# Patient Record
Sex: Male | Born: 1940 | Race: White | Hispanic: No | Marital: Married | Smoking: Former smoker
Health system: Southern US, Community
[De-identification: ages and names within clinical notes are randomized; demographics above are authoritative.]

## PROBLEM LIST (undated history)

## (undated) DIAGNOSIS — K219 Gastro-esophageal reflux disease without esophagitis: Secondary | ICD-10-CM

## (undated) DIAGNOSIS — R0600 Dyspnea, unspecified: Secondary | ICD-10-CM

## (undated) DIAGNOSIS — J449 Chronic obstructive pulmonary disease, unspecified: Secondary | ICD-10-CM

## (undated) DIAGNOSIS — I2510 Atherosclerotic heart disease of native coronary artery without angina pectoris: Secondary | ICD-10-CM

## (undated) DIAGNOSIS — G4730 Sleep apnea, unspecified: Secondary | ICD-10-CM

## (undated) DIAGNOSIS — Z7902 Long term (current) use of antithrombotics/antiplatelets: Secondary | ICD-10-CM

## (undated) DIAGNOSIS — E785 Hyperlipidemia, unspecified: Secondary | ICD-10-CM

## (undated) DIAGNOSIS — I255 Ischemic cardiomyopathy: Secondary | ICD-10-CM

## (undated) DIAGNOSIS — M1990 Unspecified osteoarthritis, unspecified site: Secondary | ICD-10-CM

## (undated) DIAGNOSIS — I4510 Unspecified right bundle-branch block: Secondary | ICD-10-CM

## (undated) DIAGNOSIS — A419 Sepsis, unspecified organism: Secondary | ICD-10-CM

## (undated) DIAGNOSIS — I509 Heart failure, unspecified: Secondary | ICD-10-CM

## (undated) DIAGNOSIS — I10 Essential (primary) hypertension: Secondary | ICD-10-CM

## (undated) DIAGNOSIS — E119 Type 2 diabetes mellitus without complications: Secondary | ICD-10-CM

## (undated) DIAGNOSIS — D649 Anemia, unspecified: Secondary | ICD-10-CM

## (undated) HISTORY — PX: HERNIA REPAIR: SHX51

## (undated) HISTORY — PX: CARDIAC SURGERY: SHX584

---

## 2004-09-06 HISTORY — PX: CORONARY ARTERY BYPASS GRAFT: SHX141

## 2004-11-18 HISTORY — PX: CORONARY ARTERY BYPASS GRAFT: SHX141

## 2005-01-08 ENCOUNTER — Other Ambulatory Visit: Payer: Self-pay

## 2005-01-11 ENCOUNTER — Ambulatory Visit: Payer: Self-pay | Admitting: Surgery

## 2005-02-12 ENCOUNTER — Ambulatory Visit: Payer: Self-pay | Admitting: Family Medicine

## 2005-02-25 ENCOUNTER — Ambulatory Visit: Payer: Self-pay | Admitting: Family Medicine

## 2005-11-14 DIAGNOSIS — I214 Non-ST elevation (NSTEMI) myocardial infarction: Secondary | ICD-10-CM

## 2005-11-14 DIAGNOSIS — I2510 Atherosclerotic heart disease of native coronary artery without angina pectoris: Secondary | ICD-10-CM

## 2005-11-14 HISTORY — DX: Atherosclerotic heart disease of native coronary artery without angina pectoris: I25.10

## 2005-11-14 HISTORY — DX: Non-ST elevation (NSTEMI) myocardial infarction: I21.4

## 2005-11-15 HISTORY — PX: CARDIAC CATHETERIZATION: SHX172

## 2005-11-18 DIAGNOSIS — Z951 Presence of aortocoronary bypass graft: Secondary | ICD-10-CM

## 2005-11-18 HISTORY — DX: Presence of aortocoronary bypass graft: Z95.1

## 2005-11-19 DIAGNOSIS — Z8679 Personal history of other diseases of the circulatory system: Secondary | ICD-10-CM

## 2005-11-19 HISTORY — DX: Personal history of other diseases of the circulatory system: Z86.79

## 2007-09-07 DIAGNOSIS — Z955 Presence of coronary angioplasty implant and graft: Secondary | ICD-10-CM

## 2007-09-07 HISTORY — DX: Presence of coronary angioplasty implant and graft: Z95.5

## 2007-09-07 HISTORY — PX: CORONARY ANGIOPLASTY WITH STENT PLACEMENT: SHX49

## 2011-01-19 DIAGNOSIS — I2510 Atherosclerotic heart disease of native coronary artery without angina pectoris: Secondary | ICD-10-CM | POA: Insufficient documentation

## 2011-01-19 DIAGNOSIS — I5042 Chronic combined systolic (congestive) and diastolic (congestive) heart failure: Secondary | ICD-10-CM | POA: Insufficient documentation

## 2011-01-19 DIAGNOSIS — E785 Hyperlipidemia, unspecified: Secondary | ICD-10-CM | POA: Insufficient documentation

## 2011-07-13 DIAGNOSIS — M159 Polyosteoarthritis, unspecified: Secondary | ICD-10-CM | POA: Insufficient documentation

## 2012-12-13 DIAGNOSIS — N529 Male erectile dysfunction, unspecified: Secondary | ICD-10-CM | POA: Insufficient documentation

## 2013-11-05 DIAGNOSIS — E349 Endocrine disorder, unspecified: Secondary | ICD-10-CM | POA: Insufficient documentation

## 2013-11-30 HISTORY — PX: COLONOSCOPY: SHX174

## 2014-02-01 HISTORY — PX: CORONARY ANGIOPLASTY WITH STENT PLACEMENT: SHX49

## 2014-04-30 DIAGNOSIS — M79604 Pain in right leg: Secondary | ICD-10-CM | POA: Insufficient documentation

## 2014-04-30 DIAGNOSIS — M79605 Pain in left leg: Secondary | ICD-10-CM | POA: Insufficient documentation

## 2014-09-11 DIAGNOSIS — L409 Psoriasis, unspecified: Secondary | ICD-10-CM | POA: Insufficient documentation

## 2015-01-27 DIAGNOSIS — G8929 Other chronic pain: Secondary | ICD-10-CM | POA: Insufficient documentation

## 2015-09-08 DIAGNOSIS — M5442 Lumbago with sciatica, left side: Secondary | ICD-10-CM | POA: Diagnosis not present

## 2015-09-08 DIAGNOSIS — M25552 Pain in left hip: Secondary | ICD-10-CM | POA: Diagnosis not present

## 2015-09-08 DIAGNOSIS — M9903 Segmental and somatic dysfunction of lumbar region: Secondary | ICD-10-CM | POA: Diagnosis not present

## 2015-09-10 DIAGNOSIS — M9903 Segmental and somatic dysfunction of lumbar region: Secondary | ICD-10-CM | POA: Diagnosis not present

## 2015-09-10 DIAGNOSIS — M25552 Pain in left hip: Secondary | ICD-10-CM | POA: Diagnosis not present

## 2015-09-10 DIAGNOSIS — M5442 Lumbago with sciatica, left side: Secondary | ICD-10-CM | POA: Diagnosis not present

## 2015-09-17 DIAGNOSIS — M5442 Lumbago with sciatica, left side: Secondary | ICD-10-CM | POA: Diagnosis not present

## 2015-09-17 DIAGNOSIS — M25552 Pain in left hip: Secondary | ICD-10-CM | POA: Diagnosis not present

## 2015-09-17 DIAGNOSIS — M9903 Segmental and somatic dysfunction of lumbar region: Secondary | ICD-10-CM | POA: Diagnosis not present

## 2015-10-22 DIAGNOSIS — I5042 Chronic combined systolic (congestive) and diastolic (congestive) heart failure: Secondary | ICD-10-CM | POA: Diagnosis not present

## 2015-10-22 DIAGNOSIS — M545 Low back pain: Secondary | ICD-10-CM | POA: Diagnosis not present

## 2015-10-22 DIAGNOSIS — E119 Type 2 diabetes mellitus without complications: Secondary | ICD-10-CM | POA: Diagnosis not present

## 2015-10-22 DIAGNOSIS — G894 Chronic pain syndrome: Secondary | ICD-10-CM | POA: Diagnosis not present

## 2015-10-22 DIAGNOSIS — I2510 Atherosclerotic heart disease of native coronary artery without angina pectoris: Secondary | ICD-10-CM | POA: Diagnosis not present

## 2015-11-04 DIAGNOSIS — H9192 Unspecified hearing loss, left ear: Secondary | ICD-10-CM | POA: Diagnosis not present

## 2015-11-14 DIAGNOSIS — I2510 Atherosclerotic heart disease of native coronary artery without angina pectoris: Secondary | ICD-10-CM | POA: Diagnosis not present

## 2015-11-14 DIAGNOSIS — G4730 Sleep apnea, unspecified: Secondary | ICD-10-CM | POA: Diagnosis not present

## 2015-11-14 DIAGNOSIS — R0602 Shortness of breath: Secondary | ICD-10-CM | POA: Diagnosis not present

## 2015-11-26 DIAGNOSIS — R9431 Abnormal electrocardiogram [ECG] [EKG]: Secondary | ICD-10-CM | POA: Diagnosis not present

## 2015-11-26 DIAGNOSIS — R0602 Shortness of breath: Secondary | ICD-10-CM | POA: Diagnosis not present

## 2015-11-26 DIAGNOSIS — Z951 Presence of aortocoronary bypass graft: Secondary | ICD-10-CM | POA: Diagnosis not present

## 2015-11-26 DIAGNOSIS — K8020 Calculus of gallbladder without cholecystitis without obstruction: Secondary | ICD-10-CM | POA: Diagnosis not present

## 2015-11-26 DIAGNOSIS — I340 Nonrheumatic mitral (valve) insufficiency: Secondary | ICD-10-CM | POA: Diagnosis not present

## 2015-11-26 DIAGNOSIS — I517 Cardiomegaly: Secondary | ICD-10-CM | POA: Diagnosis not present

## 2015-11-26 DIAGNOSIS — I519 Heart disease, unspecified: Secondary | ICD-10-CM | POA: Diagnosis not present

## 2015-11-26 DIAGNOSIS — E785 Hyperlipidemia, unspecified: Secondary | ICD-10-CM | POA: Diagnosis not present

## 2015-11-26 DIAGNOSIS — R911 Solitary pulmonary nodule: Secondary | ICD-10-CM | POA: Diagnosis not present

## 2015-11-26 DIAGNOSIS — R0600 Dyspnea, unspecified: Secondary | ICD-10-CM | POA: Diagnosis not present

## 2015-11-26 DIAGNOSIS — E119 Type 2 diabetes mellitus without complications: Secondary | ICD-10-CM | POA: Diagnosis not present

## 2015-11-26 DIAGNOSIS — R9439 Abnormal result of other cardiovascular function study: Secondary | ICD-10-CM | POA: Diagnosis not present

## 2015-11-26 DIAGNOSIS — I2510 Atherosclerotic heart disease of native coronary artery without angina pectoris: Secondary | ICD-10-CM | POA: Diagnosis not present

## 2015-11-26 DIAGNOSIS — I119 Hypertensive heart disease without heart failure: Secondary | ICD-10-CM | POA: Diagnosis not present

## 2015-12-17 DIAGNOSIS — R0602 Shortness of breath: Secondary | ICD-10-CM | POA: Diagnosis not present

## 2015-12-17 DIAGNOSIS — E119 Type 2 diabetes mellitus without complications: Secondary | ICD-10-CM | POA: Diagnosis not present

## 2015-12-17 DIAGNOSIS — E7800 Pure hypercholesterolemia, unspecified: Secondary | ICD-10-CM | POA: Diagnosis not present

## 2015-12-17 DIAGNOSIS — I2510 Atherosclerotic heart disease of native coronary artery without angina pectoris: Secondary | ICD-10-CM | POA: Diagnosis not present

## 2015-12-18 DIAGNOSIS — I2510 Atherosclerotic heart disease of native coronary artery without angina pectoris: Secondary | ICD-10-CM | POA: Diagnosis not present

## 2015-12-18 DIAGNOSIS — E7800 Pure hypercholesterolemia, unspecified: Secondary | ICD-10-CM | POA: Diagnosis not present

## 2015-12-18 DIAGNOSIS — R0602 Shortness of breath: Secondary | ICD-10-CM | POA: Diagnosis not present

## 2015-12-18 DIAGNOSIS — E119 Type 2 diabetes mellitus without complications: Secondary | ICD-10-CM | POA: Diagnosis not present

## 2015-12-23 DIAGNOSIS — I255 Ischemic cardiomyopathy: Secondary | ICD-10-CM | POA: Diagnosis not present

## 2015-12-23 DIAGNOSIS — Z833 Family history of diabetes mellitus: Secondary | ICD-10-CM | POA: Diagnosis not present

## 2015-12-23 DIAGNOSIS — R0789 Other chest pain: Secondary | ICD-10-CM | POA: Diagnosis not present

## 2015-12-23 DIAGNOSIS — I10 Essential (primary) hypertension: Secondary | ICD-10-CM | POA: Diagnosis not present

## 2015-12-23 DIAGNOSIS — Z951 Presence of aortocoronary bypass graft: Secondary | ICD-10-CM | POA: Diagnosis not present

## 2015-12-23 DIAGNOSIS — I25719 Atherosclerosis of autologous vein coronary artery bypass graft(s) with unspecified angina pectoris: Secondary | ICD-10-CM | POA: Diagnosis not present

## 2015-12-23 DIAGNOSIS — R0609 Other forms of dyspnea: Secondary | ICD-10-CM | POA: Diagnosis not present

## 2015-12-23 DIAGNOSIS — E119 Type 2 diabetes mellitus without complications: Secondary | ICD-10-CM | POA: Diagnosis not present

## 2015-12-23 DIAGNOSIS — I252 Old myocardial infarction: Secondary | ICD-10-CM | POA: Diagnosis not present

## 2015-12-23 DIAGNOSIS — E7800 Pure hypercholesterolemia, unspecified: Secondary | ICD-10-CM | POA: Diagnosis not present

## 2015-12-23 DIAGNOSIS — Z87891 Personal history of nicotine dependence: Secondary | ICD-10-CM | POA: Diagnosis not present

## 2015-12-23 DIAGNOSIS — I2510 Atherosclerotic heart disease of native coronary artery without angina pectoris: Secondary | ICD-10-CM | POA: Diagnosis not present

## 2015-12-23 DIAGNOSIS — L409 Psoriasis, unspecified: Secondary | ICD-10-CM | POA: Diagnosis not present

## 2015-12-23 DIAGNOSIS — F329 Major depressive disorder, single episode, unspecified: Secondary | ICD-10-CM | POA: Diagnosis not present

## 2015-12-23 DIAGNOSIS — Z955 Presence of coronary angioplasty implant and graft: Secondary | ICD-10-CM | POA: Diagnosis not present

## 2015-12-23 DIAGNOSIS — I25119 Atherosclerotic heart disease of native coronary artery with unspecified angina pectoris: Secondary | ICD-10-CM | POA: Diagnosis not present

## 2015-12-23 DIAGNOSIS — Z8249 Family history of ischemic heart disease and other diseases of the circulatory system: Secondary | ICD-10-CM | POA: Diagnosis not present

## 2015-12-23 HISTORY — PX: LEFT HEART CATH AND CORS/GRAFTS ANGIOGRAPHY: CATH118250

## 2015-12-23 HISTORY — PX: CARDIAC CATHETERIZATION: SHX172

## 2015-12-31 DIAGNOSIS — R0602 Shortness of breath: Secondary | ICD-10-CM | POA: Diagnosis not present

## 2015-12-31 DIAGNOSIS — E785 Hyperlipidemia, unspecified: Secondary | ICD-10-CM | POA: Diagnosis not present

## 2015-12-31 DIAGNOSIS — Z955 Presence of coronary angioplasty implant and graft: Secondary | ICD-10-CM | POA: Diagnosis not present

## 2015-12-31 DIAGNOSIS — I25118 Atherosclerotic heart disease of native coronary artery with other forms of angina pectoris: Secondary | ICD-10-CM | POA: Diagnosis not present

## 2015-12-31 DIAGNOSIS — I200 Unstable angina: Secondary | ICD-10-CM | POA: Diagnosis not present

## 2015-12-31 DIAGNOSIS — I25119 Atherosclerotic heart disease of native coronary artery with unspecified angina pectoris: Secondary | ICD-10-CM | POA: Diagnosis not present

## 2015-12-31 DIAGNOSIS — I10 Essential (primary) hypertension: Secondary | ICD-10-CM | POA: Diagnosis not present

## 2015-12-31 DIAGNOSIS — E119 Type 2 diabetes mellitus without complications: Secondary | ICD-10-CM | POA: Diagnosis not present

## 2015-12-31 DIAGNOSIS — I255 Ischemic cardiomyopathy: Secondary | ICD-10-CM | POA: Diagnosis not present

## 2015-12-31 DIAGNOSIS — I2510 Atherosclerotic heart disease of native coronary artery without angina pectoris: Secondary | ICD-10-CM | POA: Diagnosis not present

## 2015-12-31 DIAGNOSIS — Z951 Presence of aortocoronary bypass graft: Secondary | ICD-10-CM | POA: Diagnosis not present

## 2015-12-31 DIAGNOSIS — Z79899 Other long term (current) drug therapy: Secondary | ICD-10-CM | POA: Diagnosis not present

## 2015-12-31 DIAGNOSIS — R9439 Abnormal result of other cardiovascular function study: Secondary | ICD-10-CM | POA: Diagnosis not present

## 2015-12-31 HISTORY — PX: CORONARY ANGIOPLASTY WITH STENT PLACEMENT: SHX49

## 2016-01-01 DIAGNOSIS — I2510 Atherosclerotic heart disease of native coronary artery without angina pectoris: Secondary | ICD-10-CM | POA: Diagnosis not present

## 2016-01-01 DIAGNOSIS — R9431 Abnormal electrocardiogram [ECG] [EKG]: Secondary | ICD-10-CM | POA: Diagnosis not present

## 2016-01-01 DIAGNOSIS — I252 Old myocardial infarction: Secondary | ICD-10-CM | POA: Diagnosis not present

## 2016-01-20 DIAGNOSIS — E119 Type 2 diabetes mellitus without complications: Secondary | ICD-10-CM | POA: Diagnosis not present

## 2016-01-20 DIAGNOSIS — G894 Chronic pain syndrome: Secondary | ICD-10-CM | POA: Diagnosis not present

## 2016-01-20 DIAGNOSIS — H6122 Impacted cerumen, left ear: Secondary | ICD-10-CM | POA: Diagnosis not present

## 2016-01-20 DIAGNOSIS — I2510 Atherosclerotic heart disease of native coronary artery without angina pectoris: Secondary | ICD-10-CM | POA: Diagnosis not present

## 2016-03-02 DIAGNOSIS — G4733 Obstructive sleep apnea (adult) (pediatric): Secondary | ICD-10-CM | POA: Diagnosis not present

## 2016-03-02 DIAGNOSIS — G8929 Other chronic pain: Secondary | ICD-10-CM | POA: Diagnosis not present

## 2016-03-22 DIAGNOSIS — E119 Type 2 diabetes mellitus without complications: Secondary | ICD-10-CM | POA: Diagnosis not present

## 2016-03-22 DIAGNOSIS — M130 Polyarthritis, unspecified: Secondary | ICD-10-CM | POA: Diagnosis not present

## 2016-03-22 DIAGNOSIS — G894 Chronic pain syndrome: Secondary | ICD-10-CM | POA: Diagnosis not present

## 2016-04-26 DIAGNOSIS — M19019 Primary osteoarthritis, unspecified shoulder: Secondary | ICD-10-CM | POA: Diagnosis not present

## 2016-04-26 DIAGNOSIS — M791 Myalgia: Secondary | ICD-10-CM | POA: Diagnosis not present

## 2016-04-26 DIAGNOSIS — F1120 Opioid dependence, uncomplicated: Secondary | ICD-10-CM | POA: Diagnosis not present

## 2016-04-26 DIAGNOSIS — G894 Chronic pain syndrome: Secondary | ICD-10-CM | POA: Diagnosis not present

## 2016-04-26 DIAGNOSIS — M545 Low back pain: Secondary | ICD-10-CM | POA: Diagnosis not present

## 2016-04-26 DIAGNOSIS — F419 Anxiety disorder, unspecified: Secondary | ICD-10-CM | POA: Diagnosis not present

## 2016-04-26 DIAGNOSIS — G8929 Other chronic pain: Secondary | ICD-10-CM | POA: Diagnosis not present

## 2016-04-26 DIAGNOSIS — Z79899 Other long term (current) drug therapy: Secondary | ICD-10-CM | POA: Diagnosis not present

## 2016-04-26 DIAGNOSIS — M5440 Lumbago with sciatica, unspecified side: Secondary | ICD-10-CM | POA: Diagnosis not present

## 2016-05-31 DIAGNOSIS — M5440 Lumbago with sciatica, unspecified side: Secondary | ICD-10-CM | POA: Diagnosis not present

## 2016-05-31 DIAGNOSIS — M1990 Unspecified osteoarthritis, unspecified site: Secondary | ICD-10-CM | POA: Diagnosis not present

## 2016-05-31 DIAGNOSIS — F1120 Opioid dependence, uncomplicated: Secondary | ICD-10-CM | POA: Diagnosis not present

## 2016-05-31 DIAGNOSIS — G894 Chronic pain syndrome: Secondary | ICD-10-CM | POA: Diagnosis not present

## 2016-05-31 DIAGNOSIS — F419 Anxiety disorder, unspecified: Secondary | ICD-10-CM | POA: Diagnosis not present

## 2016-05-31 DIAGNOSIS — G8929 Other chronic pain: Secondary | ICD-10-CM | POA: Diagnosis not present

## 2016-05-31 DIAGNOSIS — M545 Low back pain: Secondary | ICD-10-CM | POA: Diagnosis not present

## 2016-05-31 DIAGNOSIS — M791 Myalgia: Secondary | ICD-10-CM | POA: Diagnosis not present

## 2016-05-31 DIAGNOSIS — Z79899 Other long term (current) drug therapy: Secondary | ICD-10-CM | POA: Diagnosis not present

## 2016-06-17 DIAGNOSIS — I5042 Chronic combined systolic (congestive) and diastolic (congestive) heart failure: Secondary | ICD-10-CM | POA: Diagnosis not present

## 2016-06-17 DIAGNOSIS — I2510 Atherosclerotic heart disease of native coronary artery without angina pectoris: Secondary | ICD-10-CM | POA: Diagnosis not present

## 2016-06-17 DIAGNOSIS — E119 Type 2 diabetes mellitus without complications: Secondary | ICD-10-CM | POA: Diagnosis not present

## 2016-06-28 DIAGNOSIS — M1990 Unspecified osteoarthritis, unspecified site: Secondary | ICD-10-CM | POA: Diagnosis not present

## 2016-06-28 DIAGNOSIS — M791 Myalgia: Secondary | ICD-10-CM | POA: Diagnosis not present

## 2016-06-28 DIAGNOSIS — M5440 Lumbago with sciatica, unspecified side: Secondary | ICD-10-CM | POA: Diagnosis not present

## 2016-06-28 DIAGNOSIS — G8929 Other chronic pain: Secondary | ICD-10-CM | POA: Diagnosis not present

## 2016-06-28 DIAGNOSIS — F419 Anxiety disorder, unspecified: Secondary | ICD-10-CM | POA: Diagnosis not present

## 2016-06-28 DIAGNOSIS — M545 Low back pain: Secondary | ICD-10-CM | POA: Diagnosis not present

## 2016-06-28 DIAGNOSIS — G894 Chronic pain syndrome: Secondary | ICD-10-CM | POA: Diagnosis not present

## 2016-06-28 DIAGNOSIS — F1120 Opioid dependence, uncomplicated: Secondary | ICD-10-CM | POA: Diagnosis not present

## 2016-07-14 DIAGNOSIS — I2510 Atherosclerotic heart disease of native coronary artery without angina pectoris: Secondary | ICD-10-CM | POA: Diagnosis not present

## 2016-07-26 DIAGNOSIS — F419 Anxiety disorder, unspecified: Secondary | ICD-10-CM | POA: Diagnosis not present

## 2016-07-26 DIAGNOSIS — M5440 Lumbago with sciatica, unspecified side: Secondary | ICD-10-CM | POA: Diagnosis not present

## 2016-07-26 DIAGNOSIS — M545 Low back pain: Secondary | ICD-10-CM | POA: Diagnosis not present

## 2016-07-26 DIAGNOSIS — F1120 Opioid dependence, uncomplicated: Secondary | ICD-10-CM | POA: Diagnosis not present

## 2016-07-26 DIAGNOSIS — M791 Myalgia: Secondary | ICD-10-CM | POA: Diagnosis not present

## 2016-07-26 DIAGNOSIS — M1990 Unspecified osteoarthritis, unspecified site: Secondary | ICD-10-CM | POA: Diagnosis not present

## 2016-07-26 DIAGNOSIS — G8929 Other chronic pain: Secondary | ICD-10-CM | POA: Diagnosis not present

## 2016-07-26 DIAGNOSIS — G894 Chronic pain syndrome: Secondary | ICD-10-CM | POA: Diagnosis not present

## 2016-08-23 DIAGNOSIS — M5440 Lumbago with sciatica, unspecified side: Secondary | ICD-10-CM | POA: Diagnosis not present

## 2016-08-23 DIAGNOSIS — G8929 Other chronic pain: Secondary | ICD-10-CM | POA: Diagnosis not present

## 2016-08-23 DIAGNOSIS — M1990 Unspecified osteoarthritis, unspecified site: Secondary | ICD-10-CM | POA: Diagnosis not present

## 2016-08-23 DIAGNOSIS — G894 Chronic pain syndrome: Secondary | ICD-10-CM | POA: Diagnosis not present

## 2016-08-23 DIAGNOSIS — F1120 Opioid dependence, uncomplicated: Secondary | ICD-10-CM | POA: Diagnosis not present

## 2016-08-23 DIAGNOSIS — F419 Anxiety disorder, unspecified: Secondary | ICD-10-CM | POA: Diagnosis not present

## 2016-08-23 DIAGNOSIS — M545 Low back pain: Secondary | ICD-10-CM | POA: Diagnosis not present

## 2016-08-23 DIAGNOSIS — M791 Myalgia: Secondary | ICD-10-CM | POA: Diagnosis not present

## 2016-09-14 DIAGNOSIS — E119 Type 2 diabetes mellitus without complications: Secondary | ICD-10-CM | POA: Diagnosis not present

## 2016-09-14 DIAGNOSIS — G8929 Other chronic pain: Secondary | ICD-10-CM | POA: Diagnosis not present

## 2016-09-14 DIAGNOSIS — M545 Low back pain: Secondary | ICD-10-CM | POA: Diagnosis not present

## 2016-09-14 DIAGNOSIS — I2510 Atherosclerotic heart disease of native coronary artery without angina pectoris: Secondary | ICD-10-CM | POA: Diagnosis not present

## 2016-09-14 DIAGNOSIS — M5440 Lumbago with sciatica, unspecified side: Secondary | ICD-10-CM | POA: Diagnosis not present

## 2016-09-14 DIAGNOSIS — E7800 Pure hypercholesterolemia, unspecified: Secondary | ICD-10-CM | POA: Diagnosis not present

## 2016-09-14 DIAGNOSIS — G894 Chronic pain syndrome: Secondary | ICD-10-CM | POA: Diagnosis not present

## 2016-09-16 DIAGNOSIS — H2513 Age-related nuclear cataract, bilateral: Secondary | ICD-10-CM | POA: Diagnosis not present

## 2016-09-16 DIAGNOSIS — E119 Type 2 diabetes mellitus without complications: Secondary | ICD-10-CM | POA: Diagnosis not present

## 2016-09-16 DIAGNOSIS — H43813 Vitreous degeneration, bilateral: Secondary | ICD-10-CM | POA: Diagnosis not present

## 2016-09-16 DIAGNOSIS — H35362 Drusen (degenerative) of macula, left eye: Secondary | ICD-10-CM | POA: Diagnosis not present

## 2016-09-17 DIAGNOSIS — Z0000 Encounter for general adult medical examination without abnormal findings: Secondary | ICD-10-CM | POA: Diagnosis not present

## 2016-10-06 DIAGNOSIS — Z7982 Long term (current) use of aspirin: Secondary | ICD-10-CM | POA: Diagnosis not present

## 2016-10-06 DIAGNOSIS — M25552 Pain in left hip: Secondary | ICD-10-CM | POA: Diagnosis not present

## 2016-10-06 DIAGNOSIS — Z79899 Other long term (current) drug therapy: Secondary | ICD-10-CM | POA: Diagnosis not present

## 2016-10-06 DIAGNOSIS — M5440 Lumbago with sciatica, unspecified side: Secondary | ICD-10-CM | POA: Diagnosis not present

## 2016-10-06 DIAGNOSIS — M25551 Pain in right hip: Secondary | ICD-10-CM | POA: Diagnosis not present

## 2016-10-06 DIAGNOSIS — G8929 Other chronic pain: Secondary | ICD-10-CM | POA: Diagnosis not present

## 2016-10-06 DIAGNOSIS — R451 Restlessness and agitation: Secondary | ICD-10-CM | POA: Diagnosis not present

## 2016-10-06 DIAGNOSIS — F419 Anxiety disorder, unspecified: Secondary | ICD-10-CM | POA: Diagnosis not present

## 2016-10-06 DIAGNOSIS — I5042 Chronic combined systolic (congestive) and diastolic (congestive) heart failure: Secondary | ICD-10-CM | POA: Diagnosis not present

## 2016-10-06 DIAGNOSIS — G479 Sleep disorder, unspecified: Secondary | ICD-10-CM | POA: Diagnosis not present

## 2016-10-06 DIAGNOSIS — E119 Type 2 diabetes mellitus without complications: Secondary | ICD-10-CM | POA: Diagnosis not present

## 2016-10-06 DIAGNOSIS — I110 Hypertensive heart disease with heart failure: Secondary | ICD-10-CM | POA: Diagnosis not present

## 2016-10-06 DIAGNOSIS — I2510 Atherosclerotic heart disease of native coronary artery without angina pectoris: Secondary | ICD-10-CM | POA: Diagnosis not present

## 2016-10-06 DIAGNOSIS — M62830 Muscle spasm of back: Secondary | ICD-10-CM | POA: Diagnosis not present

## 2016-10-06 DIAGNOSIS — L659 Nonscarring hair loss, unspecified: Secondary | ICD-10-CM | POA: Diagnosis not present

## 2016-10-06 DIAGNOSIS — R350 Frequency of micturition: Secondary | ICD-10-CM | POA: Diagnosis not present

## 2016-10-06 DIAGNOSIS — F329 Major depressive disorder, single episode, unspecified: Secondary | ICD-10-CM | POA: Diagnosis not present

## 2016-10-06 DIAGNOSIS — D649 Anemia, unspecified: Secondary | ICD-10-CM | POA: Diagnosis not present

## 2016-10-06 DIAGNOSIS — R5383 Other fatigue: Secondary | ICD-10-CM | POA: Diagnosis not present

## 2016-10-06 DIAGNOSIS — M25511 Pain in right shoulder: Secondary | ICD-10-CM | POA: Diagnosis not present

## 2016-10-06 DIAGNOSIS — Z791 Long term (current) use of non-steroidal anti-inflammatories (NSAID): Secondary | ICD-10-CM | POA: Diagnosis not present

## 2016-10-06 DIAGNOSIS — R0602 Shortness of breath: Secondary | ICD-10-CM | POA: Diagnosis not present

## 2016-10-06 DIAGNOSIS — E785 Hyperlipidemia, unspecified: Secondary | ICD-10-CM | POA: Diagnosis not present

## 2016-10-06 DIAGNOSIS — M25512 Pain in left shoulder: Secondary | ICD-10-CM | POA: Diagnosis not present

## 2016-10-07 ENCOUNTER — Other Ambulatory Visit: Payer: Self-pay | Admitting: Unknown Physician Specialty

## 2016-10-07 DIAGNOSIS — H908 Mixed conductive and sensorineural hearing loss, unspecified: Secondary | ICD-10-CM | POA: Diagnosis not present

## 2016-10-07 DIAGNOSIS — H6123 Impacted cerumen, bilateral: Secondary | ICD-10-CM | POA: Diagnosis not present

## 2016-10-07 DIAGNOSIS — H60339 Swimmer's ear, unspecified ear: Secondary | ICD-10-CM | POA: Diagnosis not present

## 2016-10-07 DIAGNOSIS — H9390 Unspecified disorder of ear, unspecified ear: Secondary | ICD-10-CM

## 2016-10-11 DIAGNOSIS — F419 Anxiety disorder, unspecified: Secondary | ICD-10-CM | POA: Diagnosis not present

## 2016-10-11 DIAGNOSIS — G894 Chronic pain syndrome: Secondary | ICD-10-CM | POA: Diagnosis not present

## 2016-10-11 DIAGNOSIS — F1120 Opioid dependence, uncomplicated: Secondary | ICD-10-CM | POA: Diagnosis not present

## 2016-10-11 DIAGNOSIS — M545 Low back pain: Secondary | ICD-10-CM | POA: Diagnosis not present

## 2016-10-11 DIAGNOSIS — M1990 Unspecified osteoarthritis, unspecified site: Secondary | ICD-10-CM | POA: Diagnosis not present

## 2016-10-11 DIAGNOSIS — M5440 Lumbago with sciatica, unspecified side: Secondary | ICD-10-CM | POA: Diagnosis not present

## 2016-10-11 DIAGNOSIS — G8929 Other chronic pain: Secondary | ICD-10-CM | POA: Diagnosis not present

## 2016-10-11 DIAGNOSIS — M791 Myalgia: Secondary | ICD-10-CM | POA: Diagnosis not present

## 2016-10-20 ENCOUNTER — Ambulatory Visit: Payer: PPO | Attending: Unknown Physician Specialty

## 2016-10-21 DIAGNOSIS — H9190 Unspecified hearing loss, unspecified ear: Secondary | ICD-10-CM | POA: Diagnosis not present

## 2016-10-21 DIAGNOSIS — I5042 Chronic combined systolic (congestive) and diastolic (congestive) heart failure: Secondary | ICD-10-CM | POA: Diagnosis not present

## 2016-10-21 DIAGNOSIS — I2510 Atherosclerotic heart disease of native coronary artery without angina pectoris: Secondary | ICD-10-CM | POA: Diagnosis not present

## 2016-10-21 DIAGNOSIS — E119 Type 2 diabetes mellitus without complications: Secondary | ICD-10-CM | POA: Diagnosis not present

## 2016-10-21 DIAGNOSIS — I110 Hypertensive heart disease with heart failure: Secondary | ICD-10-CM | POA: Diagnosis not present

## 2016-10-21 DIAGNOSIS — H9122 Sudden idiopathic hearing loss, left ear: Secondary | ICD-10-CM | POA: Diagnosis not present

## 2016-10-21 DIAGNOSIS — Z7982 Long term (current) use of aspirin: Secondary | ICD-10-CM | POA: Diagnosis not present

## 2016-10-21 DIAGNOSIS — M799 Soft tissue disorder, unspecified: Secondary | ICD-10-CM | POA: Diagnosis not present

## 2016-10-21 DIAGNOSIS — E785 Hyperlipidemia, unspecified: Secondary | ICD-10-CM | POA: Diagnosis not present

## 2016-10-21 DIAGNOSIS — H90A32 Mixed conductive and sensorineural hearing loss, unilateral, left ear with restricted hearing on the contralateral side: Secondary | ICD-10-CM | POA: Diagnosis not present

## 2016-10-22 DIAGNOSIS — I10 Essential (primary) hypertension: Secondary | ICD-10-CM | POA: Diagnosis not present

## 2016-10-22 DIAGNOSIS — Z951 Presence of aortocoronary bypass graft: Secondary | ICD-10-CM | POA: Diagnosis not present

## 2016-10-22 DIAGNOSIS — E7800 Pure hypercholesterolemia, unspecified: Secondary | ICD-10-CM | POA: Diagnosis not present

## 2016-10-22 DIAGNOSIS — Z7982 Long term (current) use of aspirin: Secondary | ICD-10-CM | POA: Diagnosis not present

## 2016-10-22 DIAGNOSIS — I255 Ischemic cardiomyopathy: Secondary | ICD-10-CM | POA: Diagnosis not present

## 2016-10-22 DIAGNOSIS — E119 Type 2 diabetes mellitus without complications: Secondary | ICD-10-CM | POA: Diagnosis not present

## 2016-10-22 DIAGNOSIS — Z8249 Family history of ischemic heart disease and other diseases of the circulatory system: Secondary | ICD-10-CM | POA: Diagnosis not present

## 2016-10-22 DIAGNOSIS — Z955 Presence of coronary angioplasty implant and graft: Secondary | ICD-10-CM | POA: Diagnosis not present

## 2016-10-22 DIAGNOSIS — E785 Hyperlipidemia, unspecified: Secondary | ICD-10-CM | POA: Diagnosis not present

## 2016-10-22 DIAGNOSIS — I2582 Chronic total occlusion of coronary artery: Secondary | ICD-10-CM | POA: Diagnosis not present

## 2016-10-22 DIAGNOSIS — Z01818 Encounter for other preprocedural examination: Secondary | ICD-10-CM | POA: Diagnosis not present

## 2016-10-22 DIAGNOSIS — Z79899 Other long term (current) drug therapy: Secondary | ICD-10-CM | POA: Diagnosis not present

## 2016-10-22 DIAGNOSIS — I2510 Atherosclerotic heart disease of native coronary artery without angina pectoris: Secondary | ICD-10-CM | POA: Diagnosis not present

## 2016-10-25 DIAGNOSIS — H9122 Sudden idiopathic hearing loss, left ear: Secondary | ICD-10-CM | POA: Insufficient documentation

## 2016-10-26 DIAGNOSIS — I517 Cardiomegaly: Secondary | ICD-10-CM | POA: Diagnosis not present

## 2016-10-26 DIAGNOSIS — I5189 Other ill-defined heart diseases: Secondary | ICD-10-CM | POA: Diagnosis not present

## 2016-10-26 DIAGNOSIS — I348 Other nonrheumatic mitral valve disorders: Secondary | ICD-10-CM | POA: Diagnosis not present

## 2016-11-01 DIAGNOSIS — I10 Essential (primary) hypertension: Secondary | ICD-10-CM | POA: Diagnosis not present

## 2016-11-01 DIAGNOSIS — G3184 Mild cognitive impairment, so stated: Secondary | ICD-10-CM | POA: Diagnosis not present

## 2016-11-01 DIAGNOSIS — R918 Other nonspecific abnormal finding of lung field: Secondary | ICD-10-CM | POA: Diagnosis not present

## 2016-11-01 DIAGNOSIS — I5042 Chronic combined systolic (congestive) and diastolic (congestive) heart failure: Secondary | ICD-10-CM | POA: Diagnosis not present

## 2016-11-01 DIAGNOSIS — E1165 Type 2 diabetes mellitus with hyperglycemia: Secondary | ICD-10-CM | POA: Diagnosis not present

## 2016-11-01 DIAGNOSIS — E118 Type 2 diabetes mellitus with unspecified complications: Secondary | ICD-10-CM | POA: Diagnosis not present

## 2016-11-01 DIAGNOSIS — E785 Hyperlipidemia, unspecified: Secondary | ICD-10-CM | POA: Diagnosis not present

## 2016-11-01 DIAGNOSIS — Z01818 Encounter for other preprocedural examination: Secondary | ICD-10-CM | POA: Diagnosis not present

## 2016-11-01 DIAGNOSIS — R739 Hyperglycemia, unspecified: Secondary | ICD-10-CM | POA: Diagnosis not present

## 2016-11-01 DIAGNOSIS — Z600 Problems of adjustment to life-cycle transitions: Secondary | ICD-10-CM | POA: Diagnosis not present

## 2016-11-01 DIAGNOSIS — G8929 Other chronic pain: Secondary | ICD-10-CM | POA: Diagnosis not present

## 2016-11-01 DIAGNOSIS — I110 Hypertensive heart disease with heart failure: Secondary | ICD-10-CM | POA: Diagnosis not present

## 2016-11-01 DIAGNOSIS — I2510 Atherosclerotic heart disease of native coronary artery without angina pectoris: Secondary | ICD-10-CM | POA: Diagnosis not present

## 2016-11-02 DIAGNOSIS — E119 Type 2 diabetes mellitus without complications: Secondary | ICD-10-CM | POA: Diagnosis not present

## 2016-11-09 DIAGNOSIS — E119 Type 2 diabetes mellitus without complications: Secondary | ICD-10-CM | POA: Diagnosis not present

## 2016-11-15 DIAGNOSIS — M545 Low back pain: Secondary | ICD-10-CM | POA: Diagnosis not present

## 2016-11-15 DIAGNOSIS — Z79899 Other long term (current) drug therapy: Secondary | ICD-10-CM | POA: Diagnosis not present

## 2016-11-15 DIAGNOSIS — F1120 Opioid dependence, uncomplicated: Secondary | ICD-10-CM | POA: Diagnosis not present

## 2016-11-15 DIAGNOSIS — M1990 Unspecified osteoarthritis, unspecified site: Secondary | ICD-10-CM | POA: Diagnosis not present

## 2016-11-15 DIAGNOSIS — M5440 Lumbago with sciatica, unspecified side: Secondary | ICD-10-CM | POA: Diagnosis not present

## 2016-11-15 DIAGNOSIS — G8929 Other chronic pain: Secondary | ICD-10-CM | POA: Diagnosis not present

## 2016-11-15 DIAGNOSIS — F419 Anxiety disorder, unspecified: Secondary | ICD-10-CM | POA: Diagnosis not present

## 2016-11-15 DIAGNOSIS — M791 Myalgia: Secondary | ICD-10-CM | POA: Diagnosis not present

## 2016-11-15 DIAGNOSIS — G894 Chronic pain syndrome: Secondary | ICD-10-CM | POA: Diagnosis not present

## 2016-12-06 DIAGNOSIS — G894 Chronic pain syndrome: Secondary | ICD-10-CM | POA: Diagnosis not present

## 2016-12-06 DIAGNOSIS — F419 Anxiety disorder, unspecified: Secondary | ICD-10-CM | POA: Diagnosis not present

## 2016-12-06 DIAGNOSIS — G8929 Other chronic pain: Secondary | ICD-10-CM | POA: Diagnosis not present

## 2016-12-06 DIAGNOSIS — M5440 Lumbago with sciatica, unspecified side: Secondary | ICD-10-CM | POA: Diagnosis not present

## 2016-12-06 DIAGNOSIS — M545 Low back pain: Secondary | ICD-10-CM | POA: Diagnosis not present

## 2016-12-06 DIAGNOSIS — Z79899 Other long term (current) drug therapy: Secondary | ICD-10-CM | POA: Diagnosis not present

## 2016-12-06 DIAGNOSIS — M1990 Unspecified osteoarthritis, unspecified site: Secondary | ICD-10-CM | POA: Diagnosis not present

## 2016-12-06 DIAGNOSIS — F1120 Opioid dependence, uncomplicated: Secondary | ICD-10-CM | POA: Diagnosis not present

## 2016-12-06 DIAGNOSIS — M791 Myalgia: Secondary | ICD-10-CM | POA: Diagnosis not present

## 2016-12-15 DIAGNOSIS — E785 Hyperlipidemia, unspecified: Secondary | ICD-10-CM | POA: Diagnosis not present

## 2016-12-15 DIAGNOSIS — D2322 Other benign neoplasm of skin of left ear and external auricular canal: Secondary | ICD-10-CM | POA: Diagnosis not present

## 2016-12-15 DIAGNOSIS — H7192 Unspecified cholesteatoma, left ear: Secondary | ICD-10-CM | POA: Diagnosis not present

## 2016-12-15 DIAGNOSIS — D649 Anemia, unspecified: Secondary | ICD-10-CM | POA: Diagnosis not present

## 2016-12-15 DIAGNOSIS — I5042 Chronic combined systolic (congestive) and diastolic (congestive) heart failure: Secondary | ICD-10-CM | POA: Diagnosis not present

## 2016-12-15 DIAGNOSIS — H6042 Cholesteatoma of left external ear: Secondary | ICD-10-CM | POA: Diagnosis not present

## 2016-12-15 DIAGNOSIS — E119 Type 2 diabetes mellitus without complications: Secondary | ICD-10-CM | POA: Diagnosis not present

## 2016-12-15 DIAGNOSIS — I2510 Atherosclerotic heart disease of native coronary artery without angina pectoris: Secondary | ICD-10-CM | POA: Diagnosis not present

## 2016-12-15 DIAGNOSIS — M1990 Unspecified osteoarthritis, unspecified site: Secondary | ICD-10-CM | POA: Diagnosis not present

## 2016-12-15 DIAGNOSIS — Z7982 Long term (current) use of aspirin: Secondary | ICD-10-CM | POA: Diagnosis not present

## 2016-12-15 DIAGNOSIS — H938X2 Other specified disorders of left ear: Secondary | ICD-10-CM | POA: Diagnosis not present

## 2016-12-15 DIAGNOSIS — Z951 Presence of aortocoronary bypass graft: Secondary | ICD-10-CM | POA: Diagnosis not present

## 2016-12-15 DIAGNOSIS — H9122 Sudden idiopathic hearing loss, left ear: Secondary | ICD-10-CM | POA: Diagnosis not present

## 2016-12-15 DIAGNOSIS — I252 Old myocardial infarction: Secondary | ICD-10-CM | POA: Diagnosis not present

## 2016-12-15 DIAGNOSIS — I110 Hypertensive heart disease with heart failure: Secondary | ICD-10-CM | POA: Diagnosis not present

## 2017-01-03 DIAGNOSIS — M545 Low back pain: Secondary | ICD-10-CM | POA: Diagnosis not present

## 2017-01-03 DIAGNOSIS — M5440 Lumbago with sciatica, unspecified side: Secondary | ICD-10-CM | POA: Diagnosis not present

## 2017-01-03 DIAGNOSIS — G8929 Other chronic pain: Secondary | ICD-10-CM | POA: Diagnosis not present

## 2017-01-03 DIAGNOSIS — M791 Myalgia: Secondary | ICD-10-CM | POA: Diagnosis not present

## 2017-01-03 DIAGNOSIS — Z79899 Other long term (current) drug therapy: Secondary | ICD-10-CM | POA: Diagnosis not present

## 2017-01-03 DIAGNOSIS — F419 Anxiety disorder, unspecified: Secondary | ICD-10-CM | POA: Diagnosis not present

## 2017-01-03 DIAGNOSIS — F1120 Opioid dependence, uncomplicated: Secondary | ICD-10-CM | POA: Diagnosis not present

## 2017-01-03 DIAGNOSIS — G894 Chronic pain syndrome: Secondary | ICD-10-CM | POA: Diagnosis not present

## 2017-01-25 DIAGNOSIS — R600 Localized edema: Secondary | ICD-10-CM | POA: Diagnosis not present

## 2017-01-25 DIAGNOSIS — Z7902 Long term (current) use of antithrombotics/antiplatelets: Secondary | ICD-10-CM | POA: Diagnosis not present

## 2017-01-25 DIAGNOSIS — Z79899 Other long term (current) drug therapy: Secondary | ICD-10-CM | POA: Diagnosis not present

## 2017-01-25 DIAGNOSIS — Y9389 Activity, other specified: Secondary | ICD-10-CM | POA: Diagnosis not present

## 2017-01-25 DIAGNOSIS — W230XXA Caught, crushed, jammed, or pinched between moving objects, initial encounter: Secondary | ICD-10-CM | POA: Diagnosis not present

## 2017-01-25 DIAGNOSIS — Z87891 Personal history of nicotine dependence: Secondary | ICD-10-CM | POA: Diagnosis not present

## 2017-01-25 DIAGNOSIS — Y92810 Car as the place of occurrence of the external cause: Secondary | ICD-10-CM | POA: Diagnosis not present

## 2017-01-25 DIAGNOSIS — S59911A Unspecified injury of right forearm, initial encounter: Secondary | ICD-10-CM | POA: Diagnosis not present

## 2017-01-25 DIAGNOSIS — Z7984 Long term (current) use of oral hypoglycemic drugs: Secondary | ICD-10-CM | POA: Diagnosis not present

## 2017-01-25 DIAGNOSIS — I110 Hypertensive heart disease with heart failure: Secondary | ICD-10-CM | POA: Diagnosis not present

## 2017-01-25 DIAGNOSIS — M7989 Other specified soft tissue disorders: Secondary | ICD-10-CM | POA: Diagnosis not present

## 2017-01-25 DIAGNOSIS — Z8249 Family history of ischemic heart disease and other diseases of the circulatory system: Secondary | ICD-10-CM | POA: Diagnosis not present

## 2017-01-25 DIAGNOSIS — I2510 Atherosclerotic heart disease of native coronary artery without angina pectoris: Secondary | ICD-10-CM | POA: Diagnosis not present

## 2017-01-25 DIAGNOSIS — E785 Hyperlipidemia, unspecified: Secondary | ICD-10-CM | POA: Diagnosis not present

## 2017-01-25 DIAGNOSIS — E119 Type 2 diabetes mellitus without complications: Secondary | ICD-10-CM | POA: Diagnosis not present

## 2017-01-25 DIAGNOSIS — Z7982 Long term (current) use of aspirin: Secondary | ICD-10-CM | POA: Diagnosis not present

## 2017-01-25 DIAGNOSIS — S5011XA Contusion of right forearm, initial encounter: Secondary | ICD-10-CM | POA: Diagnosis not present

## 2017-01-25 DIAGNOSIS — I5042 Chronic combined systolic (congestive) and diastolic (congestive) heart failure: Secondary | ICD-10-CM | POA: Diagnosis not present

## 2017-02-01 DIAGNOSIS — R2231 Localized swelling, mass and lump, right upper limb: Secondary | ICD-10-CM | POA: Diagnosis not present

## 2017-02-07 DIAGNOSIS — M791 Myalgia: Secondary | ICD-10-CM | POA: Diagnosis not present

## 2017-02-07 DIAGNOSIS — F419 Anxiety disorder, unspecified: Secondary | ICD-10-CM | POA: Diagnosis not present

## 2017-02-07 DIAGNOSIS — M5440 Lumbago with sciatica, unspecified side: Secondary | ICD-10-CM | POA: Diagnosis not present

## 2017-02-07 DIAGNOSIS — M545 Low back pain: Secondary | ICD-10-CM | POA: Diagnosis not present

## 2017-02-07 DIAGNOSIS — G894 Chronic pain syndrome: Secondary | ICD-10-CM | POA: Diagnosis not present

## 2017-02-07 DIAGNOSIS — M1990 Unspecified osteoarthritis, unspecified site: Secondary | ICD-10-CM | POA: Diagnosis not present

## 2017-02-07 DIAGNOSIS — G8929 Other chronic pain: Secondary | ICD-10-CM | POA: Diagnosis not present

## 2017-02-07 DIAGNOSIS — F1120 Opioid dependence, uncomplicated: Secondary | ICD-10-CM | POA: Diagnosis not present

## 2017-02-08 DIAGNOSIS — E119 Type 2 diabetes mellitus without complications: Secondary | ICD-10-CM | POA: Diagnosis not present

## 2017-02-08 DIAGNOSIS — E782 Mixed hyperlipidemia: Secondary | ICD-10-CM | POA: Diagnosis not present

## 2017-02-08 DIAGNOSIS — I2510 Atherosclerotic heart disease of native coronary artery without angina pectoris: Secondary | ICD-10-CM | POA: Diagnosis not present

## 2017-02-08 DIAGNOSIS — D126 Benign neoplasm of colon, unspecified: Secondary | ICD-10-CM | POA: Diagnosis not present

## 2017-02-08 DIAGNOSIS — S5011XD Contusion of right forearm, subsequent encounter: Secondary | ICD-10-CM | POA: Diagnosis not present

## 2017-02-08 DIAGNOSIS — Z23 Encounter for immunization: Secondary | ICD-10-CM | POA: Diagnosis not present

## 2017-02-11 DIAGNOSIS — J929 Pleural plaque without asbestos: Secondary | ICD-10-CM | POA: Diagnosis not present

## 2017-02-11 DIAGNOSIS — J439 Emphysema, unspecified: Secondary | ICD-10-CM | POA: Diagnosis not present

## 2017-02-11 DIAGNOSIS — J9811 Atelectasis: Secondary | ICD-10-CM | POA: Diagnosis not present

## 2017-02-11 DIAGNOSIS — Z122 Encounter for screening for malignant neoplasm of respiratory organs: Secondary | ICD-10-CM | POA: Diagnosis not present

## 2017-02-11 DIAGNOSIS — Z87891 Personal history of nicotine dependence: Secondary | ICD-10-CM | POA: Diagnosis not present

## 2017-02-11 DIAGNOSIS — R918 Other nonspecific abnormal finding of lung field: Secondary | ICD-10-CM | POA: Diagnosis not present

## 2017-02-11 DIAGNOSIS — Z136 Encounter for screening for cardiovascular disorders: Secondary | ICD-10-CM | POA: Diagnosis not present

## 2017-03-14 DIAGNOSIS — M545 Low back pain: Secondary | ICD-10-CM | POA: Diagnosis not present

## 2017-03-14 DIAGNOSIS — G894 Chronic pain syndrome: Secondary | ICD-10-CM | POA: Diagnosis not present

## 2017-03-14 DIAGNOSIS — G8929 Other chronic pain: Secondary | ICD-10-CM | POA: Diagnosis not present

## 2017-03-14 DIAGNOSIS — M5440 Lumbago with sciatica, unspecified side: Secondary | ICD-10-CM | POA: Diagnosis not present

## 2017-03-14 DIAGNOSIS — M1990 Unspecified osteoarthritis, unspecified site: Secondary | ICD-10-CM | POA: Diagnosis not present

## 2017-03-14 DIAGNOSIS — F1120 Opioid dependence, uncomplicated: Secondary | ICD-10-CM | POA: Diagnosis not present

## 2017-03-14 DIAGNOSIS — Z79899 Other long term (current) drug therapy: Secondary | ICD-10-CM | POA: Diagnosis not present

## 2017-03-14 DIAGNOSIS — M791 Myalgia: Secondary | ICD-10-CM | POA: Diagnosis not present

## 2017-03-14 DIAGNOSIS — F419 Anxiety disorder, unspecified: Secondary | ICD-10-CM | POA: Diagnosis not present

## 2017-05-05 DIAGNOSIS — M549 Dorsalgia, unspecified: Secondary | ICD-10-CM | POA: Diagnosis not present

## 2017-05-05 DIAGNOSIS — M1990 Unspecified osteoarthritis, unspecified site: Secondary | ICD-10-CM | POA: Diagnosis not present

## 2017-05-05 DIAGNOSIS — I252 Old myocardial infarction: Secondary | ICD-10-CM | POA: Diagnosis not present

## 2017-05-05 DIAGNOSIS — I110 Hypertensive heart disease with heart failure: Secondary | ICD-10-CM | POA: Diagnosis not present

## 2017-05-05 DIAGNOSIS — E785 Hyperlipidemia, unspecified: Secondary | ICD-10-CM | POA: Diagnosis not present

## 2017-05-05 DIAGNOSIS — Z7984 Long term (current) use of oral hypoglycemic drugs: Secondary | ICD-10-CM | POA: Diagnosis not present

## 2017-05-05 DIAGNOSIS — Z1211 Encounter for screening for malignant neoplasm of colon: Secondary | ICD-10-CM | POA: Diagnosis not present

## 2017-05-05 DIAGNOSIS — Z955 Presence of coronary angioplasty implant and graft: Secondary | ICD-10-CM | POA: Diagnosis not present

## 2017-05-05 DIAGNOSIS — I25119 Atherosclerotic heart disease of native coronary artery with unspecified angina pectoris: Secondary | ICD-10-CM | POA: Diagnosis not present

## 2017-05-05 DIAGNOSIS — Z7902 Long term (current) use of antithrombotics/antiplatelets: Secondary | ICD-10-CM | POA: Diagnosis not present

## 2017-05-05 DIAGNOSIS — D125 Benign neoplasm of sigmoid colon: Secondary | ICD-10-CM | POA: Diagnosis not present

## 2017-05-05 DIAGNOSIS — Z87891 Personal history of nicotine dependence: Secondary | ICD-10-CM | POA: Diagnosis not present

## 2017-05-05 DIAGNOSIS — G8929 Other chronic pain: Secondary | ICD-10-CM | POA: Diagnosis not present

## 2017-05-05 DIAGNOSIS — Z86010 Personal history of colonic polyps: Secondary | ICD-10-CM | POA: Diagnosis not present

## 2017-05-05 DIAGNOSIS — I5042 Chronic combined systolic (congestive) and diastolic (congestive) heart failure: Secondary | ICD-10-CM | POA: Diagnosis not present

## 2017-05-05 DIAGNOSIS — Z79899 Other long term (current) drug therapy: Secondary | ICD-10-CM | POA: Diagnosis not present

## 2017-05-05 DIAGNOSIS — E119 Type 2 diabetes mellitus without complications: Secondary | ICD-10-CM | POA: Diagnosis not present

## 2017-05-05 DIAGNOSIS — Z888 Allergy status to other drugs, medicaments and biological substances status: Secondary | ICD-10-CM | POA: Diagnosis not present

## 2017-05-05 DIAGNOSIS — Z79891 Long term (current) use of opiate analgesic: Secondary | ICD-10-CM | POA: Diagnosis not present

## 2017-05-05 DIAGNOSIS — K635 Polyp of colon: Secondary | ICD-10-CM | POA: Diagnosis not present

## 2017-05-05 DIAGNOSIS — K529 Noninfective gastroenteritis and colitis, unspecified: Secondary | ICD-10-CM | POA: Diagnosis not present

## 2017-05-05 DIAGNOSIS — Z7982 Long term (current) use of aspirin: Secondary | ICD-10-CM | POA: Diagnosis not present

## 2017-05-05 DIAGNOSIS — Z951 Presence of aortocoronary bypass graft: Secondary | ICD-10-CM | POA: Diagnosis not present

## 2017-05-05 DIAGNOSIS — D175 Benign lipomatous neoplasm of intra-abdominal organs: Secondary | ICD-10-CM | POA: Diagnosis not present

## 2017-05-05 DIAGNOSIS — K6389 Other specified diseases of intestine: Secondary | ICD-10-CM | POA: Diagnosis not present

## 2017-05-05 HISTORY — PX: COLONOSCOPY: SHX174

## 2017-05-11 DIAGNOSIS — F419 Anxiety disorder, unspecified: Secondary | ICD-10-CM | POA: Diagnosis not present

## 2017-05-11 DIAGNOSIS — M545 Low back pain: Secondary | ICD-10-CM | POA: Diagnosis not present

## 2017-05-11 DIAGNOSIS — M1990 Unspecified osteoarthritis, unspecified site: Secondary | ICD-10-CM | POA: Diagnosis not present

## 2017-05-11 DIAGNOSIS — M5440 Lumbago with sciatica, unspecified side: Secondary | ICD-10-CM | POA: Diagnosis not present

## 2017-05-11 DIAGNOSIS — R5383 Other fatigue: Secondary | ICD-10-CM | POA: Diagnosis not present

## 2017-05-11 DIAGNOSIS — M791 Myalgia: Secondary | ICD-10-CM | POA: Diagnosis not present

## 2017-05-11 DIAGNOSIS — G894 Chronic pain syndrome: Secondary | ICD-10-CM | POA: Diagnosis not present

## 2017-05-11 DIAGNOSIS — G8929 Other chronic pain: Secondary | ICD-10-CM | POA: Diagnosis not present

## 2017-05-11 DIAGNOSIS — I2510 Atherosclerotic heart disease of native coronary artery without angina pectoris: Secondary | ICD-10-CM | POA: Diagnosis not present

## 2017-05-11 DIAGNOSIS — Z79899 Other long term (current) drug therapy: Secondary | ICD-10-CM | POA: Diagnosis not present

## 2017-05-11 DIAGNOSIS — I5042 Chronic combined systolic (congestive) and diastolic (congestive) heart failure: Secondary | ICD-10-CM | POA: Diagnosis not present

## 2017-05-11 DIAGNOSIS — E119 Type 2 diabetes mellitus without complications: Secondary | ICD-10-CM | POA: Diagnosis not present

## 2017-05-11 DIAGNOSIS — F1120 Opioid dependence, uncomplicated: Secondary | ICD-10-CM | POA: Diagnosis not present

## 2017-06-06 DIAGNOSIS — F419 Anxiety disorder, unspecified: Secondary | ICD-10-CM | POA: Diagnosis not present

## 2017-06-06 DIAGNOSIS — M1990 Unspecified osteoarthritis, unspecified site: Secondary | ICD-10-CM | POA: Diagnosis not present

## 2017-06-06 DIAGNOSIS — G8929 Other chronic pain: Secondary | ICD-10-CM | POA: Diagnosis not present

## 2017-06-06 DIAGNOSIS — G894 Chronic pain syndrome: Secondary | ICD-10-CM | POA: Diagnosis not present

## 2017-06-06 DIAGNOSIS — M5440 Lumbago with sciatica, unspecified side: Secondary | ICD-10-CM | POA: Diagnosis not present

## 2017-06-06 DIAGNOSIS — M791 Myalgia, unspecified site: Secondary | ICD-10-CM | POA: Diagnosis not present

## 2017-06-06 DIAGNOSIS — Z79899 Other long term (current) drug therapy: Secondary | ICD-10-CM | POA: Diagnosis not present

## 2017-06-06 DIAGNOSIS — M545 Low back pain: Secondary | ICD-10-CM | POA: Diagnosis not present

## 2017-06-06 DIAGNOSIS — F1120 Opioid dependence, uncomplicated: Secondary | ICD-10-CM | POA: Diagnosis not present

## 2017-08-08 DIAGNOSIS — G894 Chronic pain syndrome: Secondary | ICD-10-CM | POA: Diagnosis not present

## 2017-08-08 DIAGNOSIS — F419 Anxiety disorder, unspecified: Secondary | ICD-10-CM | POA: Diagnosis not present

## 2017-08-08 DIAGNOSIS — Z79899 Other long term (current) drug therapy: Secondary | ICD-10-CM | POA: Diagnosis not present

## 2017-08-08 DIAGNOSIS — F1120 Opioid dependence, uncomplicated: Secondary | ICD-10-CM | POA: Diagnosis not present

## 2017-08-08 DIAGNOSIS — G8929 Other chronic pain: Secondary | ICD-10-CM | POA: Diagnosis not present

## 2017-08-08 DIAGNOSIS — M545 Low back pain: Secondary | ICD-10-CM | POA: Diagnosis not present

## 2017-08-08 DIAGNOSIS — M791 Myalgia, unspecified site: Secondary | ICD-10-CM | POA: Diagnosis not present

## 2017-08-08 DIAGNOSIS — M1990 Unspecified osteoarthritis, unspecified site: Secondary | ICD-10-CM | POA: Diagnosis not present

## 2017-08-08 DIAGNOSIS — M5440 Lumbago with sciatica, unspecified side: Secondary | ICD-10-CM | POA: Diagnosis not present

## 2017-08-11 DIAGNOSIS — R5383 Other fatigue: Secondary | ICD-10-CM | POA: Diagnosis not present

## 2017-08-11 DIAGNOSIS — I2510 Atherosclerotic heart disease of native coronary artery without angina pectoris: Secondary | ICD-10-CM | POA: Diagnosis not present

## 2017-08-11 DIAGNOSIS — M545 Low back pain: Secondary | ICD-10-CM | POA: Diagnosis not present

## 2017-08-11 DIAGNOSIS — E782 Mixed hyperlipidemia: Secondary | ICD-10-CM | POA: Diagnosis not present

## 2017-08-11 DIAGNOSIS — E119 Type 2 diabetes mellitus without complications: Secondary | ICD-10-CM | POA: Diagnosis not present

## 2017-08-19 DIAGNOSIS — I2510 Atherosclerotic heart disease of native coronary artery without angina pectoris: Secondary | ICD-10-CM | POA: Diagnosis not present

## 2017-10-03 DIAGNOSIS — F419 Anxiety disorder, unspecified: Secondary | ICD-10-CM | POA: Diagnosis not present

## 2017-10-03 DIAGNOSIS — F1120 Opioid dependence, uncomplicated: Secondary | ICD-10-CM | POA: Diagnosis not present

## 2017-10-03 DIAGNOSIS — M1990 Unspecified osteoarthritis, unspecified site: Secondary | ICD-10-CM | POA: Diagnosis not present

## 2017-10-03 DIAGNOSIS — G894 Chronic pain syndrome: Secondary | ICD-10-CM | POA: Diagnosis not present

## 2017-10-03 DIAGNOSIS — M545 Low back pain: Secondary | ICD-10-CM | POA: Diagnosis not present

## 2017-10-03 DIAGNOSIS — M791 Myalgia, unspecified site: Secondary | ICD-10-CM | POA: Diagnosis not present

## 2017-10-03 DIAGNOSIS — Z79899 Other long term (current) drug therapy: Secondary | ICD-10-CM | POA: Diagnosis not present

## 2017-10-03 DIAGNOSIS — M5440 Lumbago with sciatica, unspecified side: Secondary | ICD-10-CM | POA: Diagnosis not present

## 2017-11-18 DIAGNOSIS — E785 Hyperlipidemia, unspecified: Secondary | ICD-10-CM | POA: Diagnosis not present

## 2017-11-18 DIAGNOSIS — Z7982 Long term (current) use of aspirin: Secondary | ICD-10-CM | POA: Diagnosis not present

## 2017-11-18 DIAGNOSIS — I255 Ischemic cardiomyopathy: Secondary | ICD-10-CM | POA: Diagnosis not present

## 2017-11-18 DIAGNOSIS — I10 Essential (primary) hypertension: Secondary | ICD-10-CM | POA: Diagnosis not present

## 2017-11-18 DIAGNOSIS — Z7902 Long term (current) use of antithrombotics/antiplatelets: Secondary | ICD-10-CM | POA: Diagnosis not present

## 2017-11-18 DIAGNOSIS — Z79899 Other long term (current) drug therapy: Secondary | ICD-10-CM | POA: Diagnosis not present

## 2017-11-18 DIAGNOSIS — Z955 Presence of coronary angioplasty implant and graft: Secondary | ICD-10-CM | POA: Diagnosis not present

## 2017-11-18 DIAGNOSIS — E119 Type 2 diabetes mellitus without complications: Secondary | ICD-10-CM | POA: Diagnosis not present

## 2017-11-18 DIAGNOSIS — I2510 Atherosclerotic heart disease of native coronary artery without angina pectoris: Secondary | ICD-10-CM | POA: Diagnosis not present

## 2017-11-18 DIAGNOSIS — Z951 Presence of aortocoronary bypass graft: Secondary | ICD-10-CM | POA: Diagnosis not present

## 2017-11-28 DIAGNOSIS — F1120 Opioid dependence, uncomplicated: Secondary | ICD-10-CM | POA: Diagnosis not present

## 2017-11-28 DIAGNOSIS — M1990 Unspecified osteoarthritis, unspecified site: Secondary | ICD-10-CM | POA: Diagnosis not present

## 2017-11-28 DIAGNOSIS — Z79899 Other long term (current) drug therapy: Secondary | ICD-10-CM | POA: Diagnosis not present

## 2017-11-28 DIAGNOSIS — M5440 Lumbago with sciatica, unspecified side: Secondary | ICD-10-CM | POA: Diagnosis not present

## 2017-11-28 DIAGNOSIS — M545 Low back pain: Secondary | ICD-10-CM | POA: Diagnosis not present

## 2017-11-28 DIAGNOSIS — M791 Myalgia, unspecified site: Secondary | ICD-10-CM | POA: Diagnosis not present

## 2017-11-28 DIAGNOSIS — F419 Anxiety disorder, unspecified: Secondary | ICD-10-CM | POA: Diagnosis not present

## 2017-11-28 DIAGNOSIS — G894 Chronic pain syndrome: Secondary | ICD-10-CM | POA: Diagnosis not present

## 2017-11-30 DIAGNOSIS — R079 Chest pain, unspecified: Secondary | ICD-10-CM | POA: Diagnosis not present

## 2017-11-30 DIAGNOSIS — I208 Other forms of angina pectoris: Secondary | ICD-10-CM | POA: Diagnosis not present

## 2017-11-30 DIAGNOSIS — R9439 Abnormal result of other cardiovascular function study: Secondary | ICD-10-CM | POA: Diagnosis not present

## 2018-01-23 DIAGNOSIS — M5440 Lumbago with sciatica, unspecified side: Secondary | ICD-10-CM | POA: Diagnosis not present

## 2018-01-23 DIAGNOSIS — G894 Chronic pain syndrome: Secondary | ICD-10-CM | POA: Diagnosis not present

## 2018-01-23 DIAGNOSIS — M1990 Unspecified osteoarthritis, unspecified site: Secondary | ICD-10-CM | POA: Diagnosis not present

## 2018-01-23 DIAGNOSIS — M791 Myalgia, unspecified site: Secondary | ICD-10-CM | POA: Diagnosis not present

## 2018-01-23 DIAGNOSIS — F419 Anxiety disorder, unspecified: Secondary | ICD-10-CM | POA: Diagnosis not present

## 2018-01-23 DIAGNOSIS — M545 Low back pain: Secondary | ICD-10-CM | POA: Diagnosis not present

## 2018-01-23 DIAGNOSIS — F1120 Opioid dependence, uncomplicated: Secondary | ICD-10-CM | POA: Diagnosis not present

## 2018-01-23 DIAGNOSIS — Z79899 Other long term (current) drug therapy: Secondary | ICD-10-CM | POA: Diagnosis not present

## 2018-03-20 DIAGNOSIS — M1990 Unspecified osteoarthritis, unspecified site: Secondary | ICD-10-CM | POA: Diagnosis not present

## 2018-03-20 DIAGNOSIS — M791 Myalgia, unspecified site: Secondary | ICD-10-CM | POA: Diagnosis not present

## 2018-03-20 DIAGNOSIS — Z79899 Other long term (current) drug therapy: Secondary | ICD-10-CM | POA: Diagnosis not present

## 2018-03-20 DIAGNOSIS — M5440 Lumbago with sciatica, unspecified side: Secondary | ICD-10-CM | POA: Diagnosis not present

## 2018-03-20 DIAGNOSIS — F419 Anxiety disorder, unspecified: Secondary | ICD-10-CM | POA: Diagnosis not present

## 2018-03-20 DIAGNOSIS — M545 Low back pain: Secondary | ICD-10-CM | POA: Diagnosis not present

## 2018-03-20 DIAGNOSIS — F1120 Opioid dependence, uncomplicated: Secondary | ICD-10-CM | POA: Diagnosis not present

## 2018-03-20 DIAGNOSIS — G894 Chronic pain syndrome: Secondary | ICD-10-CM | POA: Diagnosis not present

## 2018-03-31 DIAGNOSIS — E538 Deficiency of other specified B group vitamins: Secondary | ICD-10-CM | POA: Insufficient documentation

## 2018-03-31 DIAGNOSIS — L719 Rosacea, unspecified: Secondary | ICD-10-CM | POA: Insufficient documentation

## 2018-03-31 DIAGNOSIS — Z0000 Encounter for general adult medical examination without abnormal findings: Secondary | ICD-10-CM | POA: Diagnosis not present

## 2018-03-31 DIAGNOSIS — I2510 Atherosclerotic heart disease of native coronary artery without angina pectoris: Secondary | ICD-10-CM | POA: Diagnosis not present

## 2018-03-31 DIAGNOSIS — E119 Type 2 diabetes mellitus without complications: Secondary | ICD-10-CM | POA: Diagnosis not present

## 2018-03-31 DIAGNOSIS — R911 Solitary pulmonary nodule: Secondary | ICD-10-CM | POA: Diagnosis not present

## 2018-04-06 DIAGNOSIS — Z122 Encounter for screening for malignant neoplasm of respiratory organs: Secondary | ICD-10-CM | POA: Diagnosis not present

## 2018-04-06 DIAGNOSIS — Z87891 Personal history of nicotine dependence: Secondary | ICD-10-CM | POA: Diagnosis not present

## 2018-04-06 DIAGNOSIS — R918 Other nonspecific abnormal finding of lung field: Secondary | ICD-10-CM | POA: Diagnosis not present

## 2018-04-28 DIAGNOSIS — R5383 Other fatigue: Secondary | ICD-10-CM | POA: Diagnosis not present

## 2018-04-28 DIAGNOSIS — E538 Deficiency of other specified B group vitamins: Secondary | ICD-10-CM | POA: Diagnosis not present

## 2018-05-15 DIAGNOSIS — M791 Myalgia, unspecified site: Secondary | ICD-10-CM | POA: Diagnosis not present

## 2018-05-15 DIAGNOSIS — M1990 Unspecified osteoarthritis, unspecified site: Secondary | ICD-10-CM | POA: Diagnosis not present

## 2018-05-15 DIAGNOSIS — F419 Anxiety disorder, unspecified: Secondary | ICD-10-CM | POA: Diagnosis not present

## 2018-05-15 DIAGNOSIS — G894 Chronic pain syndrome: Secondary | ICD-10-CM | POA: Diagnosis not present

## 2018-05-15 DIAGNOSIS — F1120 Opioid dependence, uncomplicated: Secondary | ICD-10-CM | POA: Diagnosis not present

## 2018-05-15 DIAGNOSIS — Z79899 Other long term (current) drug therapy: Secondary | ICD-10-CM | POA: Diagnosis not present

## 2018-05-15 DIAGNOSIS — M5440 Lumbago with sciatica, unspecified side: Secondary | ICD-10-CM | POA: Diagnosis not present

## 2018-05-18 DIAGNOSIS — E119 Type 2 diabetes mellitus without complications: Secondary | ICD-10-CM | POA: Diagnosis not present

## 2018-05-18 DIAGNOSIS — H35362 Drusen (degenerative) of macula, left eye: Secondary | ICD-10-CM | POA: Diagnosis not present

## 2018-05-18 DIAGNOSIS — H43393 Other vitreous opacities, bilateral: Secondary | ICD-10-CM | POA: Diagnosis not present

## 2018-05-18 DIAGNOSIS — H04123 Dry eye syndrome of bilateral lacrimal glands: Secondary | ICD-10-CM | POA: Diagnosis not present

## 2018-05-18 DIAGNOSIS — H2513 Age-related nuclear cataract, bilateral: Secondary | ICD-10-CM | POA: Diagnosis not present

## 2018-06-10 DIAGNOSIS — E785 Hyperlipidemia, unspecified: Secondary | ICD-10-CM | POA: Diagnosis not present

## 2018-06-10 DIAGNOSIS — I252 Old myocardial infarction: Secondary | ICD-10-CM | POA: Diagnosis not present

## 2018-06-10 DIAGNOSIS — Z87891 Personal history of nicotine dependence: Secondary | ICD-10-CM | POA: Diagnosis not present

## 2018-06-10 DIAGNOSIS — R918 Other nonspecific abnormal finding of lung field: Secondary | ICD-10-CM | POA: Diagnosis not present

## 2018-06-10 DIAGNOSIS — Z951 Presence of aortocoronary bypass graft: Secondary | ICD-10-CM | POA: Diagnosis not present

## 2018-06-10 DIAGNOSIS — M549 Dorsalgia, unspecified: Secondary | ICD-10-CM | POA: Diagnosis not present

## 2018-06-10 DIAGNOSIS — Z809 Family history of malignant neoplasm, unspecified: Secondary | ICD-10-CM | POA: Diagnosis not present

## 2018-06-10 DIAGNOSIS — R9431 Abnormal electrocardiogram [ECG] [EKG]: Secondary | ICD-10-CM | POA: Diagnosis not present

## 2018-06-10 DIAGNOSIS — E119 Type 2 diabetes mellitus without complications: Secondary | ICD-10-CM | POA: Diagnosis not present

## 2018-06-10 DIAGNOSIS — R05 Cough: Secondary | ICD-10-CM | POA: Diagnosis not present

## 2018-06-10 DIAGNOSIS — J069 Acute upper respiratory infection, unspecified: Secondary | ICD-10-CM | POA: Diagnosis not present

## 2018-06-10 DIAGNOSIS — Z8249 Family history of ischemic heart disease and other diseases of the circulatory system: Secondary | ICD-10-CM | POA: Diagnosis not present

## 2018-06-10 DIAGNOSIS — I5042 Chronic combined systolic (congestive) and diastolic (congestive) heart failure: Secondary | ICD-10-CM | POA: Diagnosis not present

## 2018-06-10 DIAGNOSIS — I2510 Atherosclerotic heart disease of native coronary artery without angina pectoris: Secondary | ICD-10-CM | POA: Diagnosis not present

## 2018-06-10 DIAGNOSIS — I110 Hypertensive heart disease with heart failure: Secondary | ICD-10-CM | POA: Diagnosis not present

## 2018-06-10 DIAGNOSIS — J3489 Other specified disorders of nose and nasal sinuses: Secondary | ICD-10-CM | POA: Diagnosis not present

## 2018-06-10 DIAGNOSIS — Z833 Family history of diabetes mellitus: Secondary | ICD-10-CM | POA: Diagnosis not present

## 2018-06-10 DIAGNOSIS — R5383 Other fatigue: Secondary | ICD-10-CM | POA: Diagnosis not present

## 2018-08-07 DIAGNOSIS — M5440 Lumbago with sciatica, unspecified side: Secondary | ICD-10-CM | POA: Diagnosis not present

## 2018-08-07 DIAGNOSIS — F1120 Opioid dependence, uncomplicated: Secondary | ICD-10-CM | POA: Diagnosis not present

## 2018-08-07 DIAGNOSIS — G894 Chronic pain syndrome: Secondary | ICD-10-CM | POA: Diagnosis not present

## 2018-08-07 DIAGNOSIS — M791 Myalgia, unspecified site: Secondary | ICD-10-CM | POA: Diagnosis not present

## 2018-08-07 DIAGNOSIS — Z79899 Other long term (current) drug therapy: Secondary | ICD-10-CM | POA: Diagnosis not present

## 2018-08-07 DIAGNOSIS — F419 Anxiety disorder, unspecified: Secondary | ICD-10-CM | POA: Diagnosis not present

## 2018-08-07 DIAGNOSIS — M1990 Unspecified osteoarthritis, unspecified site: Secondary | ICD-10-CM | POA: Diagnosis not present

## 2018-08-25 DIAGNOSIS — I739 Peripheral vascular disease, unspecified: Secondary | ICD-10-CM | POA: Diagnosis not present

## 2018-08-31 DIAGNOSIS — Z955 Presence of coronary angioplasty implant and graft: Secondary | ICD-10-CM | POA: Diagnosis not present

## 2018-08-31 DIAGNOSIS — I25118 Atherosclerotic heart disease of native coronary artery with other forms of angina pectoris: Secondary | ICD-10-CM | POA: Diagnosis not present

## 2018-08-31 DIAGNOSIS — R600 Localized edema: Secondary | ICD-10-CM | POA: Diagnosis not present

## 2018-08-31 DIAGNOSIS — E1151 Type 2 diabetes mellitus with diabetic peripheral angiopathy without gangrene: Secondary | ICD-10-CM | POA: Diagnosis not present

## 2018-08-31 DIAGNOSIS — E7800 Pure hypercholesterolemia, unspecified: Secondary | ICD-10-CM | POA: Diagnosis not present

## 2018-08-31 DIAGNOSIS — I10 Essential (primary) hypertension: Secondary | ICD-10-CM | POA: Diagnosis not present

## 2018-08-31 DIAGNOSIS — G8929 Other chronic pain: Secondary | ICD-10-CM | POA: Diagnosis not present

## 2018-08-31 DIAGNOSIS — I25718 Atherosclerosis of autologous vein coronary artery bypass graft(s) with other forms of angina pectoris: Secondary | ICD-10-CM | POA: Diagnosis not present

## 2018-08-31 DIAGNOSIS — J984 Other disorders of lung: Secondary | ICD-10-CM | POA: Diagnosis not present

## 2018-08-31 DIAGNOSIS — I25110 Atherosclerotic heart disease of native coronary artery with unstable angina pectoris: Secondary | ICD-10-CM | POA: Diagnosis not present

## 2018-08-31 DIAGNOSIS — Z951 Presence of aortocoronary bypass graft: Secondary | ICD-10-CM | POA: Diagnosis not present

## 2018-08-31 DIAGNOSIS — R0989 Other specified symptoms and signs involving the circulatory and respiratory systems: Secondary | ICD-10-CM | POA: Diagnosis not present

## 2018-08-31 DIAGNOSIS — E785 Hyperlipidemia, unspecified: Secondary | ICD-10-CM | POA: Diagnosis not present

## 2018-08-31 DIAGNOSIS — R0789 Other chest pain: Secondary | ICD-10-CM | POA: Diagnosis not present

## 2018-08-31 DIAGNOSIS — Z87891 Personal history of nicotine dependence: Secondary | ICD-10-CM | POA: Diagnosis not present

## 2018-08-31 DIAGNOSIS — I252 Old myocardial infarction: Secondary | ICD-10-CM | POA: Diagnosis not present

## 2018-08-31 DIAGNOSIS — I2510 Atherosclerotic heart disease of native coronary artery without angina pectoris: Secondary | ICD-10-CM | POA: Diagnosis not present

## 2018-08-31 DIAGNOSIS — R0609 Other forms of dyspnea: Secondary | ICD-10-CM | POA: Diagnosis not present

## 2018-08-31 DIAGNOSIS — I255 Ischemic cardiomyopathy: Secondary | ICD-10-CM | POA: Diagnosis not present

## 2018-08-31 HISTORY — PX: CORONARY ANGIOPLASTY WITH STENT PLACEMENT: SHX49

## 2018-08-31 HISTORY — PX: CORONARY ANGIOPLASTY: SHX604

## 2018-09-01 DIAGNOSIS — R0609 Other forms of dyspnea: Secondary | ICD-10-CM | POA: Diagnosis not present

## 2018-09-01 DIAGNOSIS — R0789 Other chest pain: Secondary | ICD-10-CM | POA: Diagnosis not present

## 2018-09-01 DIAGNOSIS — I2510 Atherosclerotic heart disease of native coronary artery without angina pectoris: Secondary | ICD-10-CM | POA: Diagnosis not present

## 2018-09-23 DIAGNOSIS — Z7984 Long term (current) use of oral hypoglycemic drugs: Secondary | ICD-10-CM | POA: Diagnosis not present

## 2018-09-23 DIAGNOSIS — I2510 Atherosclerotic heart disease of native coronary artery without angina pectoris: Secondary | ICD-10-CM | POA: Diagnosis not present

## 2018-09-23 DIAGNOSIS — R4182 Altered mental status, unspecified: Secondary | ICD-10-CM | POA: Diagnosis not present

## 2018-09-23 DIAGNOSIS — I5042 Chronic combined systolic (congestive) and diastolic (congestive) heart failure: Secondary | ICD-10-CM | POA: Diagnosis not present

## 2018-09-23 DIAGNOSIS — G894 Chronic pain syndrome: Secondary | ICD-10-CM | POA: Diagnosis not present

## 2018-09-23 DIAGNOSIS — Z7902 Long term (current) use of antithrombotics/antiplatelets: Secondary | ICD-10-CM | POA: Diagnosis not present

## 2018-09-23 DIAGNOSIS — M79661 Pain in right lower leg: Secondary | ICD-10-CM | POA: Diagnosis not present

## 2018-09-23 DIAGNOSIS — R5383 Other fatigue: Secondary | ICD-10-CM | POA: Diagnosis not present

## 2018-09-23 DIAGNOSIS — E785 Hyperlipidemia, unspecified: Secondary | ICD-10-CM | POA: Diagnosis not present

## 2018-09-23 DIAGNOSIS — N179 Acute kidney failure, unspecified: Secondary | ICD-10-CM | POA: Diagnosis not present

## 2018-09-23 DIAGNOSIS — R410 Disorientation, unspecified: Secondary | ICD-10-CM | POA: Diagnosis not present

## 2018-09-23 DIAGNOSIS — L409 Psoriasis, unspecified: Secondary | ICD-10-CM | POA: Diagnosis not present

## 2018-09-23 DIAGNOSIS — R42 Dizziness and giddiness: Secondary | ICD-10-CM | POA: Diagnosis not present

## 2018-09-23 DIAGNOSIS — Z7982 Long term (current) use of aspirin: Secondary | ICD-10-CM | POA: Diagnosis not present

## 2018-09-23 DIAGNOSIS — R4781 Slurred speech: Secondary | ICD-10-CM | POA: Diagnosis not present

## 2018-09-23 DIAGNOSIS — Z87891 Personal history of nicotine dependence: Secondary | ICD-10-CM | POA: Diagnosis not present

## 2018-09-23 DIAGNOSIS — I25119 Atherosclerotic heart disease of native coronary artery with unspecified angina pectoris: Secondary | ICD-10-CM | POA: Diagnosis not present

## 2018-09-23 DIAGNOSIS — M79662 Pain in left lower leg: Secondary | ICD-10-CM | POA: Diagnosis not present

## 2018-09-23 DIAGNOSIS — M549 Dorsalgia, unspecified: Secondary | ICD-10-CM | POA: Diagnosis not present

## 2018-09-23 DIAGNOSIS — Z79899 Other long term (current) drug therapy: Secondary | ICD-10-CM | POA: Diagnosis not present

## 2018-09-23 DIAGNOSIS — I110 Hypertensive heart disease with heart failure: Secondary | ICD-10-CM | POA: Diagnosis not present

## 2018-09-23 DIAGNOSIS — E860 Dehydration: Secondary | ICD-10-CM | POA: Diagnosis not present

## 2018-09-23 DIAGNOSIS — E538 Deficiency of other specified B group vitamins: Secondary | ICD-10-CM | POA: Diagnosis not present

## 2018-09-23 DIAGNOSIS — Z951 Presence of aortocoronary bypass graft: Secondary | ICD-10-CM | POA: Diagnosis not present

## 2018-09-23 DIAGNOSIS — G319 Degenerative disease of nervous system, unspecified: Secondary | ICD-10-CM | POA: Diagnosis not present

## 2018-09-23 DIAGNOSIS — R001 Bradycardia, unspecified: Secondary | ICD-10-CM | POA: Diagnosis not present

## 2018-09-23 DIAGNOSIS — M160 Bilateral primary osteoarthritis of hip: Secondary | ICD-10-CM | POA: Diagnosis not present

## 2018-09-23 DIAGNOSIS — E1151 Type 2 diabetes mellitus with diabetic peripheral angiopathy without gangrene: Secondary | ICD-10-CM | POA: Diagnosis not present

## 2018-09-24 DIAGNOSIS — I2510 Atherosclerotic heart disease of native coronary artery without angina pectoris: Secondary | ICD-10-CM | POA: Diagnosis not present

## 2018-09-24 DIAGNOSIS — R42 Dizziness and giddiness: Secondary | ICD-10-CM | POA: Diagnosis not present

## 2018-09-24 DIAGNOSIS — N179 Acute kidney failure, unspecified: Secondary | ICD-10-CM | POA: Diagnosis not present

## 2018-09-24 DIAGNOSIS — I5042 Chronic combined systolic (congestive) and diastolic (congestive) heart failure: Secondary | ICD-10-CM | POA: Diagnosis not present

## 2018-09-28 ENCOUNTER — Other Ambulatory Visit: Payer: Self-pay | Admitting: *Deleted

## 2018-09-28 DIAGNOSIS — R413 Other amnesia: Secondary | ICD-10-CM | POA: Diagnosis not present

## 2018-09-28 DIAGNOSIS — E119 Type 2 diabetes mellitus without complications: Secondary | ICD-10-CM | POA: Diagnosis not present

## 2018-09-28 DIAGNOSIS — I2510 Atherosclerotic heart disease of native coronary artery without angina pectoris: Secondary | ICD-10-CM | POA: Diagnosis not present

## 2018-09-28 DIAGNOSIS — N289 Disorder of kidney and ureter, unspecified: Secondary | ICD-10-CM | POA: Diagnosis not present

## 2018-09-28 DIAGNOSIS — E538 Deficiency of other specified B group vitamins: Secondary | ICD-10-CM | POA: Diagnosis not present

## 2018-09-28 NOTE — Patient Outreach (Signed)
[facility]) Care Management  09/28/2018  GURSHAN SETTLEMIRE 04-05-1941 [id number]   Subjective: Telephone call to patient's home  / mobile number, no answer, left HIPAA compliant voicemail message, and requested call back.    Objective: Per KPN (Knowledge Performance Now, point of care tool) and chart review, patient hospitalized 09/23/2018 - 09/24/2018 for dizziness at [facility].  Patient also has a history of diabetes, hyperlipidemia, chronic combined congestive heart failure, CAD, and  Osteoarthritis of multiple joints.      Assessment:  Received [employer] Transition of care referral on 09/27/2018.  Transition of care follow up pending patient contact.      Plan: RNCM will send unsuccessful outreach  letter, [facility] pamphlet, will call patient for 2nd telephone outreach attempt within 4 business days, transition of care follow up, and proceed with case closure, within 10 business days if no return call.     Vann Okerlund H. Annia Friendly, BSN, [place] Management [facility] Telephonic CM Phone: [phone] Fax: [phone]

## 2018-09-29 ENCOUNTER — Ambulatory Visit: Payer: Self-pay | Admitting: *Deleted

## 2018-10-02 ENCOUNTER — Other Ambulatory Visit: Payer: Self-pay | Admitting: *Deleted

## 2018-10-02 NOTE — Patient Outreach (Signed)
[facility]) Care Management  10/02/2018  Clifford Martin 04-06-1941 [id number]   Subjective: Telephone call to patient's home  / mobile number, no answer, left HIPAA compliant voicemail message, and requested call back.    Objective: Per KPN (Knowledge Performance Now, point of care tool) and chart review, patient hospitalized 09/23/2018 - 09/24/2018 for dizziness at [facility].  Patient also has a history of diabetes, hyperlipidemia, chronic combined congestive heart failure, CAD, and  Osteoarthritis of multiple joints.      Assessment:  Received [employer] Transition of care referral on 09/27/2018.  Transition of care follow up pending patient contact.      Plan: RNCM has sent unsuccessful outreach  letter, [facility] pamphlet, will call patient for 3rd telephone outreach attempt within 4 business days, transition of care follow up, and proceed with case closure, within 10 business days if no return call.    [name] H. Annia Friendly, BSN, [place] Management [facility] Telephonic CM Phone: [phone] Fax: [phone]

## 2018-10-03 ENCOUNTER — Other Ambulatory Visit: Payer: Self-pay | Admitting: *Deleted

## 2018-10-03 NOTE — Patient Outreach (Signed)
[facility]) Care Management  10/03/2018  Clifford Martin Dec 07, 1940 [id number]   Subjective:Telephone call to patient's home / mobile number, no answer, left HIPAA compliant voicemail message, and requested call back.    Objective:Per KPN (Knowledge Performance Now, point of care tool) and chart review,patient hospitalized 09/23/2018 - 09/24/2018 for dizziness at [facility]. Patient also has a history of diabetes, hyperlipidemia, chronic combined congestive heart failure, CAD, and Osteoarthritis of multiple joints.     Assessment: Received [employer] Transition of care referral on 09/27/2018.Transition of care follow up pending patient contact.     Plan:RNCM has sent unsuccessful outreach letter, [facility] pamphlet, and will proceed with case closure, within 10 business days if no return call.    Amitai Delaughter H. Annia Friendly, BSN, [place] Management [facility] Telephonic CM Phone: [phone] Fax: [phone]

## 2018-10-09 DIAGNOSIS — M1990 Unspecified osteoarthritis, unspecified site: Secondary | ICD-10-CM | POA: Diagnosis not present

## 2018-10-09 DIAGNOSIS — G894 Chronic pain syndrome: Secondary | ICD-10-CM | POA: Diagnosis not present

## 2018-10-09 DIAGNOSIS — F1120 Opioid dependence, uncomplicated: Secondary | ICD-10-CM | POA: Diagnosis not present

## 2018-10-09 DIAGNOSIS — Z79899 Other long term (current) drug therapy: Secondary | ICD-10-CM | POA: Diagnosis not present

## 2018-10-09 DIAGNOSIS — M5440 Lumbago with sciatica, unspecified side: Secondary | ICD-10-CM | POA: Diagnosis not present

## 2018-10-09 DIAGNOSIS — F419 Anxiety disorder, unspecified: Secondary | ICD-10-CM | POA: Diagnosis not present

## 2018-10-09 DIAGNOSIS — M791 Myalgia, unspecified site: Secondary | ICD-10-CM | POA: Diagnosis not present

## 2018-10-11 ENCOUNTER — Other Ambulatory Visit: Payer: Self-pay | Admitting: *Deleted

## 2018-10-11 NOTE — Patient Outreach (Signed)
[facility]) Care Management  10/11/2018  MAKAIL WATLING Oct 12, 1940 [id number]   No response from patient outreach attempts will proceed with case closure.     Objective:Per KPN (Knowledge Performance Now, point of care tool) and chart review,patient hospitalized 09/23/2018 - 09/24/2018 for dizziness at [facility]. Patient also has a history of diabetes, hyperlipidemia, chronic combined congestive heart failure, CAD, and Osteoarthritis of multiple joints.     Assessment: Received [employer] Transition of care referral on 09/27/2018.Transition of care follow up not completed due to unable to contact patient and will proceed with case closure.       Plan:Case closure due to unable to reach.  RNCM will send MD case closure letter.       Samreen Seltzer H. Annia Friendly, BSN, [place] Management [facility] Telephonic CM Phone: [phone] Fax: [phone]

## 2018-10-30 DIAGNOSIS — E119 Type 2 diabetes mellitus without complications: Secondary | ICD-10-CM | POA: Diagnosis not present

## 2018-10-30 DIAGNOSIS — I2510 Atherosclerotic heart disease of native coronary artery without angina pectoris: Secondary | ICD-10-CM | POA: Diagnosis not present

## 2018-10-30 DIAGNOSIS — F5101 Primary insomnia: Secondary | ICD-10-CM | POA: Diagnosis not present

## 2018-10-30 DIAGNOSIS — E538 Deficiency of other specified B group vitamins: Secondary | ICD-10-CM | POA: Diagnosis not present

## 2018-11-03 DIAGNOSIS — I25790 Atherosclerosis of other coronary artery bypass graft(s) with unstable angina pectoris: Secondary | ICD-10-CM | POA: Diagnosis not present

## 2018-11-03 DIAGNOSIS — R0602 Shortness of breath: Secondary | ICD-10-CM | POA: Diagnosis not present

## 2018-11-03 DIAGNOSIS — I2510 Atherosclerotic heart disease of native coronary artery without angina pectoris: Secondary | ICD-10-CM | POA: Diagnosis not present

## 2018-11-15 DIAGNOSIS — I340 Nonrheumatic mitral (valve) insufficiency: Secondary | ICD-10-CM | POA: Diagnosis not present

## 2018-11-15 DIAGNOSIS — I358 Other nonrheumatic aortic valve disorders: Secondary | ICD-10-CM | POA: Diagnosis not present

## 2018-11-15 DIAGNOSIS — I2510 Atherosclerotic heart disease of native coronary artery without angina pectoris: Secondary | ICD-10-CM | POA: Diagnosis not present

## 2018-12-04 DIAGNOSIS — M1990 Unspecified osteoarthritis, unspecified site: Secondary | ICD-10-CM | POA: Diagnosis not present

## 2018-12-04 DIAGNOSIS — M791 Myalgia, unspecified site: Secondary | ICD-10-CM | POA: Diagnosis not present

## 2018-12-04 DIAGNOSIS — G894 Chronic pain syndrome: Secondary | ICD-10-CM | POA: Diagnosis not present

## 2018-12-04 DIAGNOSIS — F419 Anxiety disorder, unspecified: Secondary | ICD-10-CM | POA: Diagnosis not present

## 2018-12-04 DIAGNOSIS — M545 Low back pain: Secondary | ICD-10-CM | POA: Diagnosis not present

## 2018-12-04 DIAGNOSIS — F1120 Opioid dependence, uncomplicated: Secondary | ICD-10-CM | POA: Diagnosis not present

## 2019-01-01 DIAGNOSIS — Z79899 Other long term (current) drug therapy: Secondary | ICD-10-CM | POA: Diagnosis not present

## 2019-01-01 DIAGNOSIS — M1990 Unspecified osteoarthritis, unspecified site: Secondary | ICD-10-CM | POA: Diagnosis not present

## 2019-01-01 DIAGNOSIS — G894 Chronic pain syndrome: Secondary | ICD-10-CM | POA: Diagnosis not present

## 2019-01-01 DIAGNOSIS — M545 Low back pain: Secondary | ICD-10-CM | POA: Diagnosis not present

## 2019-01-01 DIAGNOSIS — F419 Anxiety disorder, unspecified: Secondary | ICD-10-CM | POA: Diagnosis not present

## 2019-01-01 DIAGNOSIS — M791 Myalgia, unspecified site: Secondary | ICD-10-CM | POA: Diagnosis not present

## 2019-01-01 DIAGNOSIS — F1120 Opioid dependence, uncomplicated: Secondary | ICD-10-CM | POA: Diagnosis not present

## 2019-01-30 DIAGNOSIS — F5101 Primary insomnia: Secondary | ICD-10-CM | POA: Diagnosis not present

## 2019-01-30 DIAGNOSIS — E119 Type 2 diabetes mellitus without complications: Secondary | ICD-10-CM | POA: Diagnosis not present

## 2019-01-30 DIAGNOSIS — I5042 Chronic combined systolic (congestive) and diastolic (congestive) heart failure: Secondary | ICD-10-CM | POA: Diagnosis not present

## 2019-01-30 DIAGNOSIS — I2510 Atherosclerotic heart disease of native coronary artery without angina pectoris: Secondary | ICD-10-CM | POA: Diagnosis not present

## 2019-01-31 DIAGNOSIS — Z79899 Other long term (current) drug therapy: Secondary | ICD-10-CM | POA: Diagnosis not present

## 2019-01-31 DIAGNOSIS — F419 Anxiety disorder, unspecified: Secondary | ICD-10-CM | POA: Diagnosis not present

## 2019-01-31 DIAGNOSIS — F1120 Opioid dependence, uncomplicated: Secondary | ICD-10-CM | POA: Diagnosis not present

## 2019-01-31 DIAGNOSIS — G894 Chronic pain syndrome: Secondary | ICD-10-CM | POA: Diagnosis not present

## 2019-01-31 DIAGNOSIS — M791 Myalgia, unspecified site: Secondary | ICD-10-CM | POA: Diagnosis not present

## 2019-01-31 DIAGNOSIS — M5440 Lumbago with sciatica, unspecified side: Secondary | ICD-10-CM | POA: Diagnosis not present

## 2019-01-31 DIAGNOSIS — M1990 Unspecified osteoarthritis, unspecified site: Secondary | ICD-10-CM | POA: Diagnosis not present

## 2019-02-19 DIAGNOSIS — M5440 Lumbago with sciatica, unspecified side: Secondary | ICD-10-CM | POA: Diagnosis not present

## 2019-02-19 DIAGNOSIS — G894 Chronic pain syndrome: Secondary | ICD-10-CM | POA: Diagnosis not present

## 2019-02-19 DIAGNOSIS — Z79899 Other long term (current) drug therapy: Secondary | ICD-10-CM | POA: Diagnosis not present

## 2019-02-19 DIAGNOSIS — F419 Anxiety disorder, unspecified: Secondary | ICD-10-CM | POA: Diagnosis not present

## 2019-02-19 DIAGNOSIS — F1120 Opioid dependence, uncomplicated: Secondary | ICD-10-CM | POA: Diagnosis not present

## 2019-02-19 DIAGNOSIS — M791 Myalgia, unspecified site: Secondary | ICD-10-CM | POA: Diagnosis not present

## 2019-02-19 DIAGNOSIS — M1990 Unspecified osteoarthritis, unspecified site: Secondary | ICD-10-CM | POA: Diagnosis not present

## 2019-02-20 DIAGNOSIS — I2510 Atherosclerotic heart disease of native coronary artery without angina pectoris: Secondary | ICD-10-CM | POA: Diagnosis not present

## 2019-02-20 DIAGNOSIS — I509 Heart failure, unspecified: Secondary | ICD-10-CM | POA: Diagnosis not present

## 2019-02-20 DIAGNOSIS — R0609 Other forms of dyspnea: Secondary | ICD-10-CM | POA: Diagnosis not present

## 2019-02-20 DIAGNOSIS — G4700 Insomnia, unspecified: Secondary | ICD-10-CM | POA: Diagnosis not present

## 2019-02-23 DIAGNOSIS — Z20828 Contact with and (suspected) exposure to other viral communicable diseases: Secondary | ICD-10-CM | POA: Diagnosis not present

## 2019-02-23 DIAGNOSIS — I10 Essential (primary) hypertension: Secondary | ICD-10-CM | POA: Diagnosis not present

## 2019-02-23 DIAGNOSIS — I2510 Atherosclerotic heart disease of native coronary artery without angina pectoris: Secondary | ICD-10-CM | POA: Diagnosis not present

## 2019-02-23 DIAGNOSIS — Z87891 Personal history of nicotine dependence: Secondary | ICD-10-CM | POA: Diagnosis not present

## 2019-02-23 DIAGNOSIS — E119 Type 2 diabetes mellitus without complications: Secondary | ICD-10-CM | POA: Diagnosis not present

## 2019-02-23 DIAGNOSIS — Z1159 Encounter for screening for other viral diseases: Secondary | ICD-10-CM | POA: Diagnosis not present

## 2019-02-28 DIAGNOSIS — G894 Chronic pain syndrome: Secondary | ICD-10-CM | POA: Diagnosis not present

## 2019-02-28 DIAGNOSIS — F1120 Opioid dependence, uncomplicated: Secondary | ICD-10-CM | POA: Diagnosis not present

## 2019-02-28 DIAGNOSIS — F419 Anxiety disorder, unspecified: Secondary | ICD-10-CM | POA: Diagnosis not present

## 2019-02-28 DIAGNOSIS — M1990 Unspecified osteoarthritis, unspecified site: Secondary | ICD-10-CM | POA: Diagnosis not present

## 2019-02-28 DIAGNOSIS — Z79899 Other long term (current) drug therapy: Secondary | ICD-10-CM | POA: Diagnosis not present

## 2019-02-28 DIAGNOSIS — M791 Myalgia, unspecified site: Secondary | ICD-10-CM | POA: Diagnosis not present

## 2019-02-28 DIAGNOSIS — M5440 Lumbago with sciatica, unspecified side: Secondary | ICD-10-CM | POA: Diagnosis not present

## 2019-03-07 DIAGNOSIS — F1120 Opioid dependence, uncomplicated: Secondary | ICD-10-CM | POA: Diagnosis not present

## 2019-03-07 DIAGNOSIS — Z79899 Other long term (current) drug therapy: Secondary | ICD-10-CM | POA: Diagnosis not present

## 2019-03-07 DIAGNOSIS — M5440 Lumbago with sciatica, unspecified side: Secondary | ICD-10-CM | POA: Diagnosis not present

## 2019-03-07 DIAGNOSIS — G894 Chronic pain syndrome: Secondary | ICD-10-CM | POA: Diagnosis not present

## 2019-03-07 DIAGNOSIS — M791 Myalgia, unspecified site: Secondary | ICD-10-CM | POA: Diagnosis not present

## 2019-03-07 DIAGNOSIS — M1990 Unspecified osteoarthritis, unspecified site: Secondary | ICD-10-CM | POA: Diagnosis not present

## 2019-03-07 DIAGNOSIS — F419 Anxiety disorder, unspecified: Secondary | ICD-10-CM | POA: Diagnosis not present

## 2019-03-28 DIAGNOSIS — Z79899 Other long term (current) drug therapy: Secondary | ICD-10-CM | POA: Diagnosis not present

## 2019-03-28 DIAGNOSIS — F419 Anxiety disorder, unspecified: Secondary | ICD-10-CM | POA: Diagnosis not present

## 2019-03-28 DIAGNOSIS — M545 Low back pain: Secondary | ICD-10-CM | POA: Diagnosis not present

## 2019-03-28 DIAGNOSIS — M791 Myalgia, unspecified site: Secondary | ICD-10-CM | POA: Diagnosis not present

## 2019-03-28 DIAGNOSIS — G894 Chronic pain syndrome: Secondary | ICD-10-CM | POA: Diagnosis not present

## 2019-03-28 DIAGNOSIS — F1120 Opioid dependence, uncomplicated: Secondary | ICD-10-CM | POA: Diagnosis not present

## 2019-03-28 DIAGNOSIS — M1990 Unspecified osteoarthritis, unspecified site: Secondary | ICD-10-CM | POA: Diagnosis not present

## 2019-04-25 DIAGNOSIS — M1990 Unspecified osteoarthritis, unspecified site: Secondary | ICD-10-CM | POA: Diagnosis not present

## 2019-04-25 DIAGNOSIS — M5440 Lumbago with sciatica, unspecified side: Secondary | ICD-10-CM | POA: Diagnosis not present

## 2019-04-25 DIAGNOSIS — M791 Myalgia, unspecified site: Secondary | ICD-10-CM | POA: Diagnosis not present

## 2019-04-25 DIAGNOSIS — F1120 Opioid dependence, uncomplicated: Secondary | ICD-10-CM | POA: Diagnosis not present

## 2019-04-25 DIAGNOSIS — F419 Anxiety disorder, unspecified: Secondary | ICD-10-CM | POA: Diagnosis not present

## 2019-04-25 DIAGNOSIS — G894 Chronic pain syndrome: Secondary | ICD-10-CM | POA: Diagnosis not present

## 2019-05-21 DIAGNOSIS — G894 Chronic pain syndrome: Secondary | ICD-10-CM | POA: Diagnosis not present

## 2019-05-21 DIAGNOSIS — M1990 Unspecified osteoarthritis, unspecified site: Secondary | ICD-10-CM | POA: Diagnosis not present

## 2019-05-21 DIAGNOSIS — M791 Myalgia, unspecified site: Secondary | ICD-10-CM | POA: Diagnosis not present

## 2019-05-21 DIAGNOSIS — F1120 Opioid dependence, uncomplicated: Secondary | ICD-10-CM | POA: Diagnosis not present

## 2019-05-21 DIAGNOSIS — F419 Anxiety disorder, unspecified: Secondary | ICD-10-CM | POA: Diagnosis not present

## 2019-05-21 DIAGNOSIS — Z79899 Other long term (current) drug therapy: Secondary | ICD-10-CM | POA: Diagnosis not present

## 2019-05-21 DIAGNOSIS — M5440 Lumbago with sciatica, unspecified side: Secondary | ICD-10-CM | POA: Diagnosis not present

## 2019-05-23 DIAGNOSIS — R42 Dizziness and giddiness: Secondary | ICD-10-CM | POA: Diagnosis not present

## 2019-05-23 DIAGNOSIS — D649 Anemia, unspecified: Secondary | ICD-10-CM | POA: Diagnosis not present

## 2019-05-23 DIAGNOSIS — I252 Old myocardial infarction: Secondary | ICD-10-CM | POA: Diagnosis not present

## 2019-05-23 DIAGNOSIS — I509 Heart failure, unspecified: Secondary | ICD-10-CM | POA: Diagnosis not present

## 2019-05-23 DIAGNOSIS — I2510 Atherosclerotic heart disease of native coronary artery without angina pectoris: Secondary | ICD-10-CM | POA: Diagnosis not present

## 2019-05-23 DIAGNOSIS — Z79899 Other long term (current) drug therapy: Secondary | ICD-10-CM | POA: Diagnosis not present

## 2019-05-23 DIAGNOSIS — Z7984 Long term (current) use of oral hypoglycemic drugs: Secondary | ICD-10-CM | POA: Diagnosis not present

## 2019-05-23 DIAGNOSIS — I110 Hypertensive heart disease with heart failure: Secondary | ICD-10-CM | POA: Diagnosis not present

## 2019-05-23 DIAGNOSIS — E8770 Fluid overload, unspecified: Secondary | ICD-10-CM | POA: Diagnosis not present

## 2019-05-23 DIAGNOSIS — R0600 Dyspnea, unspecified: Secondary | ICD-10-CM | POA: Diagnosis not present

## 2019-05-23 DIAGNOSIS — R0602 Shortness of breath: Secondary | ICD-10-CM | POA: Diagnosis not present

## 2019-05-23 DIAGNOSIS — Z8249 Family history of ischemic heart disease and other diseases of the circulatory system: Secondary | ICD-10-CM | POA: Diagnosis not present

## 2019-05-23 DIAGNOSIS — Z7902 Long term (current) use of antithrombotics/antiplatelets: Secondary | ICD-10-CM | POA: Diagnosis not present

## 2019-05-23 DIAGNOSIS — Z7982 Long term (current) use of aspirin: Secondary | ICD-10-CM | POA: Diagnosis not present

## 2019-05-23 DIAGNOSIS — E119 Type 2 diabetes mellitus without complications: Secondary | ICD-10-CM | POA: Diagnosis not present

## 2019-05-23 DIAGNOSIS — Z888 Allergy status to other drugs, medicaments and biological substances status: Secondary | ICD-10-CM | POA: Diagnosis not present

## 2019-05-23 DIAGNOSIS — M549 Dorsalgia, unspecified: Secondary | ICD-10-CM | POA: Diagnosis not present

## 2019-05-23 DIAGNOSIS — E8779 Other fluid overload: Secondary | ICD-10-CM | POA: Diagnosis not present

## 2019-05-23 DIAGNOSIS — Z951 Presence of aortocoronary bypass graft: Secondary | ICD-10-CM | POA: Diagnosis not present

## 2019-05-23 DIAGNOSIS — Z87891 Personal history of nicotine dependence: Secondary | ICD-10-CM | POA: Diagnosis not present

## 2019-05-23 DIAGNOSIS — E785 Hyperlipidemia, unspecified: Secondary | ICD-10-CM | POA: Diagnosis not present

## 2019-05-23 DIAGNOSIS — G8929 Other chronic pain: Secondary | ICD-10-CM | POA: Diagnosis not present

## 2019-05-23 DIAGNOSIS — Z955 Presence of coronary angioplasty implant and graft: Secondary | ICD-10-CM | POA: Diagnosis not present

## 2019-05-30 DIAGNOSIS — R0602 Shortness of breath: Secondary | ICD-10-CM | POA: Diagnosis not present

## 2019-05-30 DIAGNOSIS — Z23 Encounter for immunization: Secondary | ICD-10-CM | POA: Diagnosis not present

## 2019-05-30 DIAGNOSIS — E119 Type 2 diabetes mellitus without complications: Secondary | ICD-10-CM | POA: Diagnosis not present

## 2019-05-30 DIAGNOSIS — I5042 Chronic combined systolic (congestive) and diastolic (congestive) heart failure: Secondary | ICD-10-CM | POA: Diagnosis not present

## 2019-06-13 DIAGNOSIS — R0602 Shortness of breath: Secondary | ICD-10-CM | POA: Diagnosis not present

## 2019-06-20 DIAGNOSIS — M791 Myalgia, unspecified site: Secondary | ICD-10-CM | POA: Diagnosis not present

## 2019-06-20 DIAGNOSIS — G894 Chronic pain syndrome: Secondary | ICD-10-CM | POA: Diagnosis not present

## 2019-06-20 DIAGNOSIS — M1990 Unspecified osteoarthritis, unspecified site: Secondary | ICD-10-CM | POA: Diagnosis not present

## 2019-06-20 DIAGNOSIS — F419 Anxiety disorder, unspecified: Secondary | ICD-10-CM | POA: Diagnosis not present

## 2019-06-20 DIAGNOSIS — M5440 Lumbago with sciatica, unspecified side: Secondary | ICD-10-CM | POA: Diagnosis not present

## 2019-06-20 DIAGNOSIS — F1120 Opioid dependence, uncomplicated: Secondary | ICD-10-CM | POA: Diagnosis not present

## 2019-07-30 DIAGNOSIS — F1120 Opioid dependence, uncomplicated: Secondary | ICD-10-CM | POA: Diagnosis not present

## 2019-07-30 DIAGNOSIS — M791 Myalgia, unspecified site: Secondary | ICD-10-CM | POA: Diagnosis not present

## 2019-07-30 DIAGNOSIS — Z79899 Other long term (current) drug therapy: Secondary | ICD-10-CM | POA: Diagnosis not present

## 2019-07-30 DIAGNOSIS — F419 Anxiety disorder, unspecified: Secondary | ICD-10-CM | POA: Diagnosis not present

## 2019-07-30 DIAGNOSIS — M5440 Lumbago with sciatica, unspecified side: Secondary | ICD-10-CM | POA: Diagnosis not present

## 2019-07-30 DIAGNOSIS — G894 Chronic pain syndrome: Secondary | ICD-10-CM | POA: Diagnosis not present

## 2019-07-30 DIAGNOSIS — M1990 Unspecified osteoarthritis, unspecified site: Secondary | ICD-10-CM | POA: Diagnosis not present

## 2019-08-21 DIAGNOSIS — Z87891 Personal history of nicotine dependence: Secondary | ICD-10-CM | POA: Diagnosis not present

## 2019-08-21 DIAGNOSIS — R911 Solitary pulmonary nodule: Secondary | ICD-10-CM | POA: Diagnosis not present

## 2019-08-21 DIAGNOSIS — I5042 Chronic combined systolic (congestive) and diastolic (congestive) heart failure: Secondary | ICD-10-CM | POA: Diagnosis not present

## 2019-08-21 DIAGNOSIS — R0600 Dyspnea, unspecified: Secondary | ICD-10-CM | POA: Diagnosis not present

## 2019-08-21 DIAGNOSIS — I2510 Atherosclerotic heart disease of native coronary artery without angina pectoris: Secondary | ICD-10-CM | POA: Diagnosis not present

## 2019-08-21 DIAGNOSIS — J4520 Mild intermittent asthma, uncomplicated: Secondary | ICD-10-CM | POA: Diagnosis not present

## 2019-08-21 DIAGNOSIS — E119 Type 2 diabetes mellitus without complications: Secondary | ICD-10-CM | POA: Diagnosis not present

## 2019-08-27 DIAGNOSIS — M791 Myalgia, unspecified site: Secondary | ICD-10-CM | POA: Diagnosis not present

## 2019-08-27 DIAGNOSIS — F1120 Opioid dependence, uncomplicated: Secondary | ICD-10-CM | POA: Diagnosis not present

## 2019-08-27 DIAGNOSIS — G894 Chronic pain syndrome: Secondary | ICD-10-CM | POA: Diagnosis not present

## 2019-08-27 DIAGNOSIS — M5440 Lumbago with sciatica, unspecified side: Secondary | ICD-10-CM | POA: Diagnosis not present

## 2019-08-27 DIAGNOSIS — M1990 Unspecified osteoarthritis, unspecified site: Secondary | ICD-10-CM | POA: Diagnosis not present

## 2019-08-27 DIAGNOSIS — Z79899 Other long term (current) drug therapy: Secondary | ICD-10-CM | POA: Diagnosis not present

## 2019-08-27 DIAGNOSIS — F419 Anxiety disorder, unspecified: Secondary | ICD-10-CM | POA: Diagnosis not present

## 2019-09-24 DIAGNOSIS — Z79899 Other long term (current) drug therapy: Secondary | ICD-10-CM | POA: Diagnosis not present

## 2019-09-24 DIAGNOSIS — G894 Chronic pain syndrome: Secondary | ICD-10-CM | POA: Diagnosis not present

## 2019-09-24 DIAGNOSIS — F419 Anxiety disorder, unspecified: Secondary | ICD-10-CM | POA: Diagnosis not present

## 2019-09-24 DIAGNOSIS — M545 Low back pain: Secondary | ICD-10-CM | POA: Diagnosis not present

## 2019-09-24 DIAGNOSIS — M1990 Unspecified osteoarthritis, unspecified site: Secondary | ICD-10-CM | POA: Diagnosis not present

## 2019-09-24 DIAGNOSIS — M791 Myalgia, unspecified site: Secondary | ICD-10-CM | POA: Diagnosis not present

## 2019-09-24 DIAGNOSIS — F1120 Opioid dependence, uncomplicated: Secondary | ICD-10-CM | POA: Diagnosis not present

## 2019-11-05 DIAGNOSIS — F419 Anxiety disorder, unspecified: Secondary | ICD-10-CM | POA: Diagnosis not present

## 2019-11-05 DIAGNOSIS — M1990 Unspecified osteoarthritis, unspecified site: Secondary | ICD-10-CM | POA: Diagnosis not present

## 2019-11-05 DIAGNOSIS — G894 Chronic pain syndrome: Secondary | ICD-10-CM | POA: Diagnosis not present

## 2019-11-05 DIAGNOSIS — M5440 Lumbago with sciatica, unspecified side: Secondary | ICD-10-CM | POA: Diagnosis not present

## 2019-11-05 DIAGNOSIS — F1120 Opioid dependence, uncomplicated: Secondary | ICD-10-CM | POA: Diagnosis not present

## 2019-11-05 DIAGNOSIS — Z79899 Other long term (current) drug therapy: Secondary | ICD-10-CM | POA: Diagnosis not present

## 2019-11-05 DIAGNOSIS — M791 Myalgia, unspecified site: Secondary | ICD-10-CM | POA: Diagnosis not present

## 2019-11-29 DIAGNOSIS — M7062 Trochanteric bursitis, left hip: Secondary | ICD-10-CM | POA: Diagnosis not present

## 2019-11-29 DIAGNOSIS — L409 Psoriasis, unspecified: Secondary | ICD-10-CM | POA: Diagnosis not present

## 2019-11-29 DIAGNOSIS — E119 Type 2 diabetes mellitus without complications: Secondary | ICD-10-CM | POA: Diagnosis not present

## 2019-11-29 DIAGNOSIS — I2510 Atherosclerotic heart disease of native coronary artery without angina pectoris: Secondary | ICD-10-CM | POA: Diagnosis not present

## 2019-12-03 DIAGNOSIS — Z79899 Other long term (current) drug therapy: Secondary | ICD-10-CM | POA: Diagnosis not present

## 2019-12-03 DIAGNOSIS — F419 Anxiety disorder, unspecified: Secondary | ICD-10-CM | POA: Diagnosis not present

## 2019-12-03 DIAGNOSIS — M791 Myalgia, unspecified site: Secondary | ICD-10-CM | POA: Diagnosis not present

## 2019-12-03 DIAGNOSIS — M1990 Unspecified osteoarthritis, unspecified site: Secondary | ICD-10-CM | POA: Diagnosis not present

## 2019-12-03 DIAGNOSIS — F1120 Opioid dependence, uncomplicated: Secondary | ICD-10-CM | POA: Diagnosis not present

## 2019-12-03 DIAGNOSIS — M5440 Lumbago with sciatica, unspecified side: Secondary | ICD-10-CM | POA: Diagnosis not present

## 2019-12-03 DIAGNOSIS — G894 Chronic pain syndrome: Secondary | ICD-10-CM | POA: Diagnosis not present

## 2019-12-31 DIAGNOSIS — F419 Anxiety disorder, unspecified: Secondary | ICD-10-CM | POA: Diagnosis not present

## 2019-12-31 DIAGNOSIS — F1120 Opioid dependence, uncomplicated: Secondary | ICD-10-CM | POA: Diagnosis not present

## 2019-12-31 DIAGNOSIS — M791 Myalgia, unspecified site: Secondary | ICD-10-CM | POA: Diagnosis not present

## 2019-12-31 DIAGNOSIS — Z79899 Other long term (current) drug therapy: Secondary | ICD-10-CM | POA: Diagnosis not present

## 2019-12-31 DIAGNOSIS — M1990 Unspecified osteoarthritis, unspecified site: Secondary | ICD-10-CM | POA: Diagnosis not present

## 2019-12-31 DIAGNOSIS — G894 Chronic pain syndrome: Secondary | ICD-10-CM | POA: Diagnosis not present

## 2019-12-31 DIAGNOSIS — M5440 Lumbago with sciatica, unspecified side: Secondary | ICD-10-CM | POA: Diagnosis not present

## 2020-01-18 DIAGNOSIS — M1990 Unspecified osteoarthritis, unspecified site: Secondary | ICD-10-CM | POA: Diagnosis not present

## 2020-01-18 DIAGNOSIS — Z79899 Other long term (current) drug therapy: Secondary | ICD-10-CM | POA: Diagnosis not present

## 2020-01-18 DIAGNOSIS — M5440 Lumbago with sciatica, unspecified side: Secondary | ICD-10-CM | POA: Diagnosis not present

## 2020-01-18 DIAGNOSIS — G894 Chronic pain syndrome: Secondary | ICD-10-CM | POA: Diagnosis not present

## 2020-01-18 DIAGNOSIS — M791 Myalgia, unspecified site: Secondary | ICD-10-CM | POA: Diagnosis not present

## 2020-01-18 DIAGNOSIS — F419 Anxiety disorder, unspecified: Secondary | ICD-10-CM | POA: Diagnosis not present

## 2020-01-18 DIAGNOSIS — F1120 Opioid dependence, uncomplicated: Secondary | ICD-10-CM | POA: Diagnosis not present

## 2020-02-25 DIAGNOSIS — F1120 Opioid dependence, uncomplicated: Secondary | ICD-10-CM | POA: Diagnosis not present

## 2020-02-25 DIAGNOSIS — M791 Myalgia, unspecified site: Secondary | ICD-10-CM | POA: Diagnosis not present

## 2020-02-25 DIAGNOSIS — G894 Chronic pain syndrome: Secondary | ICD-10-CM | POA: Diagnosis not present

## 2020-02-25 DIAGNOSIS — Z79899 Other long term (current) drug therapy: Secondary | ICD-10-CM | POA: Diagnosis not present

## 2020-02-25 DIAGNOSIS — M1990 Unspecified osteoarthritis, unspecified site: Secondary | ICD-10-CM | POA: Diagnosis not present

## 2020-02-25 DIAGNOSIS — M5440 Lumbago with sciatica, unspecified side: Secondary | ICD-10-CM | POA: Diagnosis not present

## 2020-02-25 DIAGNOSIS — F419 Anxiety disorder, unspecified: Secondary | ICD-10-CM | POA: Diagnosis not present

## 2020-04-28 DIAGNOSIS — Z79899 Other long term (current) drug therapy: Secondary | ICD-10-CM | POA: Diagnosis not present

## 2020-04-28 DIAGNOSIS — F1120 Opioid dependence, uncomplicated: Secondary | ICD-10-CM | POA: Diagnosis not present

## 2020-04-30 DIAGNOSIS — F1120 Opioid dependence, uncomplicated: Secondary | ICD-10-CM | POA: Diagnosis not present

## 2020-04-30 DIAGNOSIS — G894 Chronic pain syndrome: Secondary | ICD-10-CM | POA: Diagnosis not present

## 2020-04-30 DIAGNOSIS — M1990 Unspecified osteoarthritis, unspecified site: Secondary | ICD-10-CM | POA: Diagnosis not present

## 2020-04-30 DIAGNOSIS — M791 Myalgia, unspecified site: Secondary | ICD-10-CM | POA: Diagnosis not present

## 2020-04-30 DIAGNOSIS — F419 Anxiety disorder, unspecified: Secondary | ICD-10-CM | POA: Diagnosis not present

## 2020-04-30 DIAGNOSIS — M5440 Lumbago with sciatica, unspecified side: Secondary | ICD-10-CM | POA: Diagnosis not present

## 2020-06-09 DIAGNOSIS — I2510 Atherosclerotic heart disease of native coronary artery without angina pectoris: Secondary | ICD-10-CM | POA: Diagnosis not present

## 2020-06-09 DIAGNOSIS — E119 Type 2 diabetes mellitus without complications: Secondary | ICD-10-CM | POA: Diagnosis not present

## 2020-06-09 DIAGNOSIS — Z23 Encounter for immunization: Secondary | ICD-10-CM | POA: Diagnosis not present

## 2020-06-10 DIAGNOSIS — F1120 Opioid dependence, uncomplicated: Secondary | ICD-10-CM | POA: Diagnosis not present

## 2020-06-10 DIAGNOSIS — Z79891 Long term (current) use of opiate analgesic: Secondary | ICD-10-CM | POA: Diagnosis not present

## 2020-06-25 DIAGNOSIS — G894 Chronic pain syndrome: Secondary | ICD-10-CM | POA: Diagnosis not present

## 2020-06-25 DIAGNOSIS — F419 Anxiety disorder, unspecified: Secondary | ICD-10-CM | POA: Diagnosis not present

## 2020-06-25 DIAGNOSIS — M791 Myalgia, unspecified site: Secondary | ICD-10-CM | POA: Diagnosis not present

## 2020-06-25 DIAGNOSIS — M5440 Lumbago with sciatica, unspecified side: Secondary | ICD-10-CM | POA: Diagnosis not present

## 2020-06-25 DIAGNOSIS — F1120 Opioid dependence, uncomplicated: Secondary | ICD-10-CM | POA: Diagnosis not present

## 2020-06-25 DIAGNOSIS — M1990 Unspecified osteoarthritis, unspecified site: Secondary | ICD-10-CM | POA: Diagnosis not present

## 2020-08-03 DIAGNOSIS — M546 Pain in thoracic spine: Secondary | ICD-10-CM | POA: Diagnosis not present

## 2020-08-03 DIAGNOSIS — M542 Cervicalgia: Secondary | ICD-10-CM | POA: Diagnosis not present

## 2020-08-03 DIAGNOSIS — I959 Hypotension, unspecified: Secondary | ICD-10-CM | POA: Diagnosis not present

## 2020-08-03 DIAGNOSIS — R0789 Other chest pain: Secondary | ICD-10-CM | POA: Diagnosis not present

## 2020-08-03 DIAGNOSIS — R069 Unspecified abnormalities of breathing: Secondary | ICD-10-CM | POA: Diagnosis not present

## 2020-08-03 DIAGNOSIS — E875 Hyperkalemia: Secondary | ICD-10-CM | POA: Diagnosis not present

## 2020-08-03 DIAGNOSIS — R110 Nausea: Secondary | ICD-10-CM | POA: Diagnosis not present

## 2020-08-03 DIAGNOSIS — R079 Chest pain, unspecified: Secondary | ICD-10-CM | POA: Diagnosis not present

## 2020-08-03 DIAGNOSIS — I252 Old myocardial infarction: Secondary | ICD-10-CM | POA: Diagnosis not present

## 2020-08-03 DIAGNOSIS — I10 Essential (primary) hypertension: Secondary | ICD-10-CM | POA: Diagnosis not present

## 2020-08-03 DIAGNOSIS — M549 Dorsalgia, unspecified: Secondary | ICD-10-CM | POA: Diagnosis not present

## 2020-08-06 DIAGNOSIS — R0789 Other chest pain: Secondary | ICD-10-CM | POA: Diagnosis not present

## 2020-08-09 DIAGNOSIS — D72829 Elevated white blood cell count, unspecified: Secondary | ICD-10-CM | POA: Diagnosis not present

## 2020-08-09 DIAGNOSIS — I5042 Chronic combined systolic (congestive) and diastolic (congestive) heart failure: Secondary | ICD-10-CM | POA: Diagnosis not present

## 2020-08-09 DIAGNOSIS — J9601 Acute respiratory failure with hypoxia: Secondary | ICD-10-CM | POA: Diagnosis not present

## 2020-08-09 DIAGNOSIS — M25511 Pain in right shoulder: Secondary | ICD-10-CM | POA: Diagnosis not present

## 2020-08-09 DIAGNOSIS — E1122 Type 2 diabetes mellitus with diabetic chronic kidney disease: Secondary | ICD-10-CM | POA: Diagnosis not present

## 2020-08-09 DIAGNOSIS — Z7902 Long term (current) use of antithrombotics/antiplatelets: Secondary | ICD-10-CM | POA: Diagnosis not present

## 2020-08-09 DIAGNOSIS — K5730 Diverticulosis of large intestine without perforation or abscess without bleeding: Secondary | ICD-10-CM | POA: Diagnosis not present

## 2020-08-09 DIAGNOSIS — Z955 Presence of coronary angioplasty implant and graft: Secondary | ICD-10-CM | POA: Diagnosis not present

## 2020-08-09 DIAGNOSIS — Z20822 Contact with and (suspected) exposure to covid-19: Secondary | ICD-10-CM | POA: Diagnosis not present

## 2020-08-09 DIAGNOSIS — M546 Pain in thoracic spine: Secondary | ICD-10-CM | POA: Diagnosis not present

## 2020-08-09 DIAGNOSIS — N179 Acute kidney failure, unspecified: Secondary | ICD-10-CM | POA: Diagnosis not present

## 2020-08-09 DIAGNOSIS — I4510 Unspecified right bundle-branch block: Secondary | ICD-10-CM | POA: Diagnosis not present

## 2020-08-09 DIAGNOSIS — Z7982 Long term (current) use of aspirin: Secondary | ICD-10-CM | POA: Diagnosis not present

## 2020-08-09 DIAGNOSIS — M6281 Muscle weakness (generalized): Secondary | ICD-10-CM | POA: Diagnosis not present

## 2020-08-09 DIAGNOSIS — Z87891 Personal history of nicotine dependence: Secondary | ICD-10-CM | POA: Diagnosis not present

## 2020-08-09 DIAGNOSIS — I255 Ischemic cardiomyopathy: Secondary | ICD-10-CM | POA: Diagnosis not present

## 2020-08-09 DIAGNOSIS — G8929 Other chronic pain: Secondary | ICD-10-CM | POA: Diagnosis not present

## 2020-08-09 DIAGNOSIS — R079 Chest pain, unspecified: Secondary | ICD-10-CM | POA: Diagnosis not present

## 2020-08-09 DIAGNOSIS — R0789 Other chest pain: Secondary | ICD-10-CM | POA: Diagnosis not present

## 2020-08-09 DIAGNOSIS — I2510 Atherosclerotic heart disease of native coronary artery without angina pectoris: Secondary | ICD-10-CM | POA: Diagnosis not present

## 2020-08-09 DIAGNOSIS — Z951 Presence of aortocoronary bypass graft: Secondary | ICD-10-CM | POA: Diagnosis not present

## 2020-08-09 DIAGNOSIS — Z79899 Other long term (current) drug therapy: Secondary | ICD-10-CM | POA: Diagnosis not present

## 2020-08-09 DIAGNOSIS — E785 Hyperlipidemia, unspecified: Secondary | ICD-10-CM | POA: Diagnosis not present

## 2020-08-09 DIAGNOSIS — R918 Other nonspecific abnormal finding of lung field: Secondary | ICD-10-CM | POA: Diagnosis not present

## 2020-08-09 DIAGNOSIS — Z7984 Long term (current) use of oral hypoglycemic drugs: Secondary | ICD-10-CM | POA: Diagnosis not present

## 2020-08-09 DIAGNOSIS — N182 Chronic kidney disease, stage 2 (mild): Secondary | ICD-10-CM | POA: Diagnosis not present

## 2020-08-09 DIAGNOSIS — R410 Disorientation, unspecified: Secondary | ICD-10-CM | POA: Diagnosis not present

## 2020-08-09 DIAGNOSIS — I110 Hypertensive heart disease with heart failure: Secondary | ICD-10-CM | POA: Diagnosis not present

## 2020-08-09 DIAGNOSIS — Z79891 Long term (current) use of opiate analgesic: Secondary | ICD-10-CM | POA: Diagnosis not present

## 2020-08-09 DIAGNOSIS — I77811 Abdominal aortic ectasia: Secondary | ICD-10-CM | POA: Diagnosis not present

## 2020-08-10 DIAGNOSIS — I2510 Atherosclerotic heart disease of native coronary artery without angina pectoris: Secondary | ICD-10-CM | POA: Diagnosis not present

## 2020-08-10 DIAGNOSIS — I255 Ischemic cardiomyopathy: Secondary | ICD-10-CM | POA: Diagnosis not present

## 2020-08-10 DIAGNOSIS — D72829 Elevated white blood cell count, unspecified: Secondary | ICD-10-CM | POA: Diagnosis not present

## 2020-08-10 DIAGNOSIS — M549 Dorsalgia, unspecified: Secondary | ICD-10-CM | POA: Diagnosis not present

## 2020-08-10 DIAGNOSIS — R410 Disorientation, unspecified: Secondary | ICD-10-CM | POA: Diagnosis not present

## 2020-08-10 DIAGNOSIS — I10 Essential (primary) hypertension: Secondary | ICD-10-CM | POA: Diagnosis not present

## 2020-08-10 DIAGNOSIS — R0789 Other chest pain: Secondary | ICD-10-CM | POA: Diagnosis not present

## 2020-08-10 DIAGNOSIS — J9601 Acute respiratory failure with hypoxia: Secondary | ICD-10-CM | POA: Diagnosis not present

## 2020-08-14 DIAGNOSIS — E119 Type 2 diabetes mellitus without complications: Secondary | ICD-10-CM | POA: Diagnosis not present

## 2020-08-14 DIAGNOSIS — D649 Anemia, unspecified: Secondary | ICD-10-CM | POA: Diagnosis not present

## 2020-08-14 DIAGNOSIS — F1120 Opioid dependence, uncomplicated: Secondary | ICD-10-CM | POA: Diagnosis not present

## 2020-08-14 DIAGNOSIS — R410 Disorientation, unspecified: Secondary | ICD-10-CM | POA: Diagnosis not present

## 2020-08-14 DIAGNOSIS — Z79891 Long term (current) use of opiate analgesic: Secondary | ICD-10-CM | POA: Diagnosis not present

## 2020-08-14 DIAGNOSIS — I2510 Atherosclerotic heart disease of native coronary artery without angina pectoris: Secondary | ICD-10-CM | POA: Diagnosis not present

## 2020-08-20 DIAGNOSIS — M545 Low back pain, unspecified: Secondary | ICD-10-CM | POA: Diagnosis not present

## 2020-08-20 DIAGNOSIS — G894 Chronic pain syndrome: Secondary | ICD-10-CM | POA: Diagnosis not present

## 2020-08-20 DIAGNOSIS — F1120 Opioid dependence, uncomplicated: Secondary | ICD-10-CM | POA: Diagnosis not present

## 2020-08-20 DIAGNOSIS — F419 Anxiety disorder, unspecified: Secondary | ICD-10-CM | POA: Diagnosis not present

## 2020-08-28 DIAGNOSIS — E119 Type 2 diabetes mellitus without complications: Secondary | ICD-10-CM | POA: Diagnosis not present

## 2020-08-28 DIAGNOSIS — I2510 Atherosclerotic heart disease of native coronary artery without angina pectoris: Secondary | ICD-10-CM | POA: Diagnosis not present

## 2020-10-29 DIAGNOSIS — G894 Chronic pain syndrome: Secondary | ICD-10-CM | POA: Diagnosis not present

## 2020-10-29 DIAGNOSIS — F1120 Opioid dependence, uncomplicated: Secondary | ICD-10-CM | POA: Diagnosis not present

## 2020-10-29 DIAGNOSIS — M791 Myalgia, unspecified site: Secondary | ICD-10-CM | POA: Diagnosis not present

## 2020-10-29 DIAGNOSIS — F419 Anxiety disorder, unspecified: Secondary | ICD-10-CM | POA: Diagnosis not present

## 2020-10-29 DIAGNOSIS — M5440 Lumbago with sciatica, unspecified side: Secondary | ICD-10-CM | POA: Diagnosis not present

## 2020-10-29 DIAGNOSIS — M1990 Unspecified osteoarthritis, unspecified site: Secondary | ICD-10-CM | POA: Diagnosis not present

## 2020-11-26 DIAGNOSIS — Z79891 Long term (current) use of opiate analgesic: Secondary | ICD-10-CM | POA: Diagnosis not present

## 2020-11-26 DIAGNOSIS — F1120 Opioid dependence, uncomplicated: Secondary | ICD-10-CM | POA: Diagnosis not present

## 2020-12-09 DIAGNOSIS — E782 Mixed hyperlipidemia: Secondary | ICD-10-CM | POA: Diagnosis not present

## 2020-12-09 DIAGNOSIS — I5042 Chronic combined systolic (congestive) and diastolic (congestive) heart failure: Secondary | ICD-10-CM | POA: Diagnosis not present

## 2020-12-09 DIAGNOSIS — I2510 Atherosclerotic heart disease of native coronary artery without angina pectoris: Secondary | ICD-10-CM | POA: Diagnosis not present

## 2020-12-09 DIAGNOSIS — E119 Type 2 diabetes mellitus without complications: Secondary | ICD-10-CM | POA: Diagnosis not present

## 2020-12-09 DIAGNOSIS — G8929 Other chronic pain: Secondary | ICD-10-CM | POA: Diagnosis not present

## 2020-12-09 DIAGNOSIS — M8949 Other hypertrophic osteoarthropathy, multiple sites: Secondary | ICD-10-CM | POA: Diagnosis not present

## 2020-12-24 DIAGNOSIS — M1990 Unspecified osteoarthritis, unspecified site: Secondary | ICD-10-CM | POA: Diagnosis not present

## 2020-12-24 DIAGNOSIS — G8929 Other chronic pain: Secondary | ICD-10-CM | POA: Diagnosis not present

## 2020-12-24 DIAGNOSIS — G894 Chronic pain syndrome: Secondary | ICD-10-CM | POA: Diagnosis not present

## 2020-12-24 DIAGNOSIS — M545 Low back pain, unspecified: Secondary | ICD-10-CM | POA: Diagnosis not present

## 2020-12-24 DIAGNOSIS — F419 Anxiety disorder, unspecified: Secondary | ICD-10-CM | POA: Diagnosis not present

## 2020-12-24 DIAGNOSIS — F1120 Opioid dependence, uncomplicated: Secondary | ICD-10-CM | POA: Diagnosis not present

## 2020-12-24 DIAGNOSIS — M5440 Lumbago with sciatica, unspecified side: Secondary | ICD-10-CM | POA: Diagnosis not present

## 2020-12-24 DIAGNOSIS — M791 Myalgia, unspecified site: Secondary | ICD-10-CM | POA: Diagnosis not present

## 2021-01-08 DIAGNOSIS — E119 Type 2 diabetes mellitus without complications: Secondary | ICD-10-CM | POA: Diagnosis not present

## 2021-02-10 DIAGNOSIS — Z79891 Long term (current) use of opiate analgesic: Secondary | ICD-10-CM | POA: Diagnosis not present

## 2021-02-10 DIAGNOSIS — F1120 Opioid dependence, uncomplicated: Secondary | ICD-10-CM | POA: Diagnosis not present

## 2021-03-04 DIAGNOSIS — G894 Chronic pain syndrome: Secondary | ICD-10-CM | POA: Diagnosis not present

## 2021-03-04 DIAGNOSIS — F419 Anxiety disorder, unspecified: Secondary | ICD-10-CM | POA: Diagnosis not present

## 2021-03-04 DIAGNOSIS — M791 Myalgia, unspecified site: Secondary | ICD-10-CM | POA: Diagnosis not present

## 2021-03-04 DIAGNOSIS — M1990 Unspecified osteoarthritis, unspecified site: Secondary | ICD-10-CM | POA: Diagnosis not present

## 2021-03-04 DIAGNOSIS — F1120 Opioid dependence, uncomplicated: Secondary | ICD-10-CM | POA: Diagnosis not present

## 2021-03-04 DIAGNOSIS — M5440 Lumbago with sciatica, unspecified side: Secondary | ICD-10-CM | POA: Diagnosis not present

## 2021-04-13 DIAGNOSIS — Z0000 Encounter for general adult medical examination without abnormal findings: Secondary | ICD-10-CM | POA: Insufficient documentation

## 2021-04-13 DIAGNOSIS — I5042 Chronic combined systolic (congestive) and diastolic (congestive) heart failure: Secondary | ICD-10-CM | POA: Diagnosis not present

## 2021-04-13 DIAGNOSIS — M25552 Pain in left hip: Secondary | ICD-10-CM | POA: Diagnosis not present

## 2021-04-13 DIAGNOSIS — Z23 Encounter for immunization: Secondary | ICD-10-CM | POA: Diagnosis not present

## 2021-04-13 DIAGNOSIS — E349 Endocrine disorder, unspecified: Secondary | ICD-10-CM | POA: Diagnosis not present

## 2021-04-13 DIAGNOSIS — Z1159 Encounter for screening for other viral diseases: Secondary | ICD-10-CM | POA: Diagnosis not present

## 2021-04-13 DIAGNOSIS — E119 Type 2 diabetes mellitus without complications: Secondary | ICD-10-CM | POA: Diagnosis not present

## 2021-04-13 DIAGNOSIS — I2510 Atherosclerotic heart disease of native coronary artery without angina pectoris: Secondary | ICD-10-CM | POA: Diagnosis not present

## 2021-04-13 DIAGNOSIS — G8929 Other chronic pain: Secondary | ICD-10-CM | POA: Diagnosis not present

## 2021-04-13 DIAGNOSIS — Z951 Presence of aortocoronary bypass graft: Secondary | ICD-10-CM | POA: Diagnosis not present

## 2021-04-13 DIAGNOSIS — R5383 Other fatigue: Secondary | ICD-10-CM | POA: Diagnosis not present

## 2021-04-13 DIAGNOSIS — E538 Deficiency of other specified B group vitamins: Secondary | ICD-10-CM | POA: Diagnosis not present

## 2021-04-20 DIAGNOSIS — I110 Hypertensive heart disease with heart failure: Secondary | ICD-10-CM | POA: Diagnosis not present

## 2021-04-20 DIAGNOSIS — M545 Low back pain, unspecified: Secondary | ICD-10-CM | POA: Diagnosis not present

## 2021-04-20 DIAGNOSIS — Z87891 Personal history of nicotine dependence: Secondary | ICD-10-CM | POA: Diagnosis not present

## 2021-04-20 DIAGNOSIS — I2510 Atherosclerotic heart disease of native coronary artery without angina pectoris: Secondary | ICD-10-CM | POA: Diagnosis not present

## 2021-04-20 DIAGNOSIS — R55 Syncope and collapse: Secondary | ICD-10-CM | POA: Diagnosis not present

## 2021-04-20 DIAGNOSIS — R918 Other nonspecific abnormal finding of lung field: Secondary | ICD-10-CM | POA: Diagnosis not present

## 2021-04-20 DIAGNOSIS — N179 Acute kidney failure, unspecified: Secondary | ICD-10-CM | POA: Diagnosis not present

## 2021-04-20 DIAGNOSIS — E119 Type 2 diabetes mellitus without complications: Secondary | ICD-10-CM | POA: Diagnosis not present

## 2021-04-20 DIAGNOSIS — R410 Disorientation, unspecified: Secondary | ICD-10-CM | POA: Diagnosis not present

## 2021-04-20 DIAGNOSIS — M159 Polyosteoarthritis, unspecified: Secondary | ICD-10-CM | POA: Diagnosis not present

## 2021-04-20 DIAGNOSIS — J189 Pneumonia, unspecified organism: Secondary | ICD-10-CM | POA: Diagnosis not present

## 2021-04-20 DIAGNOSIS — Z20822 Contact with and (suspected) exposure to covid-19: Secondary | ICD-10-CM | POA: Diagnosis not present

## 2021-04-20 DIAGNOSIS — Z7902 Long term (current) use of antithrombotics/antiplatelets: Secondary | ICD-10-CM | POA: Diagnosis not present

## 2021-04-20 DIAGNOSIS — I272 Pulmonary hypertension, unspecified: Secondary | ICD-10-CM | POA: Diagnosis not present

## 2021-04-20 DIAGNOSIS — G8929 Other chronic pain: Secondary | ICD-10-CM | POA: Diagnosis not present

## 2021-04-20 DIAGNOSIS — D72829 Elevated white blood cell count, unspecified: Secondary | ICD-10-CM | POA: Diagnosis not present

## 2021-04-20 DIAGNOSIS — Z955 Presence of coronary angioplasty implant and graft: Secondary | ICD-10-CM | POA: Diagnosis not present

## 2021-04-20 DIAGNOSIS — Z7982 Long term (current) use of aspirin: Secondary | ICD-10-CM | POA: Diagnosis not present

## 2021-04-20 DIAGNOSIS — I340 Nonrheumatic mitral (valve) insufficiency: Secondary | ICD-10-CM | POA: Diagnosis not present

## 2021-04-20 DIAGNOSIS — Z79899 Other long term (current) drug therapy: Secondary | ICD-10-CM | POA: Diagnosis not present

## 2021-04-20 DIAGNOSIS — Z7984 Long term (current) use of oral hypoglycemic drugs: Secondary | ICD-10-CM | POA: Diagnosis not present

## 2021-04-20 DIAGNOSIS — E860 Dehydration: Secondary | ICD-10-CM | POA: Diagnosis not present

## 2021-04-20 DIAGNOSIS — R7989 Other specified abnormal findings of blood chemistry: Secondary | ICD-10-CM | POA: Diagnosis not present

## 2021-04-20 DIAGNOSIS — E785 Hyperlipidemia, unspecified: Secondary | ICD-10-CM | POA: Diagnosis not present

## 2021-04-20 DIAGNOSIS — R9431 Abnormal electrocardiogram [ECG] [EKG]: Secondary | ICD-10-CM | POA: Diagnosis not present

## 2021-04-20 DIAGNOSIS — I5042 Chronic combined systolic (congestive) and diastolic (congestive) heart failure: Secondary | ICD-10-CM | POA: Diagnosis not present

## 2021-04-21 DIAGNOSIS — R55 Syncope and collapse: Secondary | ICD-10-CM | POA: Diagnosis not present

## 2021-04-21 DIAGNOSIS — R944 Abnormal results of kidney function studies: Secondary | ICD-10-CM | POA: Diagnosis not present

## 2021-04-21 DIAGNOSIS — I5042 Chronic combined systolic (congestive) and diastolic (congestive) heart failure: Secondary | ICD-10-CM | POA: Diagnosis not present

## 2021-04-21 DIAGNOSIS — R412 Retrograde amnesia: Secondary | ICD-10-CM | POA: Diagnosis not present

## 2021-04-21 DIAGNOSIS — I5022 Chronic systolic (congestive) heart failure: Secondary | ICD-10-CM | POA: Diagnosis not present

## 2021-04-22 DIAGNOSIS — N179 Acute kidney failure, unspecified: Secondary | ICD-10-CM | POA: Diagnosis not present

## 2021-04-22 DIAGNOSIS — R55 Syncope and collapse: Secondary | ICD-10-CM | POA: Diagnosis not present

## 2021-04-22 DIAGNOSIS — J189 Pneumonia, unspecified organism: Secondary | ICD-10-CM | POA: Diagnosis not present

## 2021-04-22 DIAGNOSIS — I5042 Chronic combined systolic (congestive) and diastolic (congestive) heart failure: Secondary | ICD-10-CM | POA: Diagnosis not present

## 2021-04-23 DIAGNOSIS — R55 Syncope and collapse: Secondary | ICD-10-CM | POA: Diagnosis not present

## 2021-04-29 DIAGNOSIS — Z79899 Other long term (current) drug therapy: Secondary | ICD-10-CM | POA: Diagnosis not present

## 2021-05-04 DIAGNOSIS — G894 Chronic pain syndrome: Secondary | ICD-10-CM | POA: Diagnosis not present

## 2021-05-04 DIAGNOSIS — F1120 Opioid dependence, uncomplicated: Secondary | ICD-10-CM | POA: Diagnosis not present

## 2021-05-04 DIAGNOSIS — M5440 Lumbago with sciatica, unspecified side: Secondary | ICD-10-CM | POA: Diagnosis not present

## 2021-05-04 DIAGNOSIS — M791 Myalgia, unspecified site: Secondary | ICD-10-CM | POA: Diagnosis not present

## 2021-06-04 DIAGNOSIS — R55 Syncope and collapse: Secondary | ICD-10-CM | POA: Diagnosis not present

## 2021-06-22 DIAGNOSIS — M545 Low back pain, unspecified: Secondary | ICD-10-CM | POA: Diagnosis not present

## 2021-06-22 DIAGNOSIS — M542 Cervicalgia: Secondary | ICD-10-CM | POA: Diagnosis not present

## 2021-06-22 DIAGNOSIS — G894 Chronic pain syndrome: Secondary | ICD-10-CM | POA: Diagnosis not present

## 2021-08-24 DIAGNOSIS — E349 Endocrine disorder, unspecified: Secondary | ICD-10-CM | POA: Diagnosis not present

## 2021-08-24 DIAGNOSIS — E538 Deficiency of other specified B group vitamins: Secondary | ICD-10-CM | POA: Diagnosis not present

## 2021-08-24 DIAGNOSIS — G8929 Other chronic pain: Secondary | ICD-10-CM | POA: Diagnosis not present

## 2021-08-24 DIAGNOSIS — Z1159 Encounter for screening for other viral diseases: Secondary | ICD-10-CM | POA: Diagnosis not present

## 2021-08-24 DIAGNOSIS — R0609 Other forms of dyspnea: Secondary | ICD-10-CM | POA: Diagnosis not present

## 2021-08-24 DIAGNOSIS — Z23 Encounter for immunization: Secondary | ICD-10-CM | POA: Diagnosis not present

## 2021-08-24 DIAGNOSIS — E119 Type 2 diabetes mellitus without complications: Secondary | ICD-10-CM | POA: Diagnosis not present

## 2021-08-27 ENCOUNTER — Other Ambulatory Visit: Payer: Self-pay

## 2021-08-27 ENCOUNTER — Inpatient Hospital Stay
Admission: EM | Admit: 2021-08-27 | Discharge: 2021-09-01 | DRG: 871 | Disposition: A | Discharge status: Home / Self Care | Payer: PPO | Attending: Internal Medicine | Admitting: Internal Medicine

## 2021-08-27 DIAGNOSIS — R918 Other nonspecific abnormal finding of lung field: Secondary | ICD-10-CM

## 2021-08-27 DIAGNOSIS — Z951 Presence of aortocoronary bypass graft: Secondary | ICD-10-CM

## 2021-08-27 DIAGNOSIS — R9389 Abnormal findings on diagnostic imaging of other specified body structures: Secondary | ICD-10-CM

## 2021-08-27 DIAGNOSIS — I5042 Chronic combined systolic (congestive) and diastolic (congestive) heart failure: Secondary | ICD-10-CM | POA: Diagnosis present

## 2021-08-27 DIAGNOSIS — Z9861 Coronary angioplasty status: Secondary | ICD-10-CM

## 2021-08-27 DIAGNOSIS — K8020 Calculus of gallbladder without cholecystitis without obstruction: Secondary | ICD-10-CM

## 2021-08-27 DIAGNOSIS — E785 Hyperlipidemia, unspecified: Secondary | ICD-10-CM | POA: Diagnosis present

## 2021-08-27 DIAGNOSIS — I959 Hypotension, unspecified: Secondary | ICD-10-CM | POA: Diagnosis present

## 2021-08-27 DIAGNOSIS — A419 Sepsis, unspecified organism: Secondary | ICD-10-CM | POA: Diagnosis not present

## 2021-08-27 DIAGNOSIS — I214 Non-ST elevation (NSTEMI) myocardial infarction: Secondary | ICD-10-CM

## 2021-08-27 DIAGNOSIS — Z7902 Long term (current) use of antithrombotics/antiplatelets: Secondary | ICD-10-CM

## 2021-08-27 DIAGNOSIS — R911 Solitary pulmonary nodule: Secondary | ICD-10-CM | POA: Diagnosis present

## 2021-08-27 DIAGNOSIS — K8021 Calculus of gallbladder without cholecystitis with obstruction: Secondary | ICD-10-CM | POA: Diagnosis present

## 2021-08-27 DIAGNOSIS — R6520 Severe sepsis without septic shock: Secondary | ICD-10-CM | POA: Diagnosis present

## 2021-08-27 DIAGNOSIS — R7989 Other specified abnormal findings of blood chemistry: Secondary | ICD-10-CM

## 2021-08-27 DIAGNOSIS — J9601 Acute respiratory failure with hypoxia: Secondary | ICD-10-CM

## 2021-08-27 DIAGNOSIS — R0602 Shortness of breath: Secondary | ICD-10-CM

## 2021-08-27 DIAGNOSIS — Z7984 Long term (current) use of oral hypoglycemic drugs: Secondary | ICD-10-CM

## 2021-08-27 DIAGNOSIS — K8070 Calculus of gallbladder and bile duct without cholecystitis without obstruction: Secondary | ICD-10-CM

## 2021-08-27 DIAGNOSIS — R4182 Altered mental status, unspecified: Secondary | ICD-10-CM

## 2021-08-27 DIAGNOSIS — B9620 Unspecified Escherichia coli [E. coli] as the cause of diseases classified elsewhere: Secondary | ICD-10-CM | POA: Diagnosis present

## 2021-08-27 DIAGNOSIS — K7200 Acute and subacute hepatic failure without coma: Secondary | ICD-10-CM | POA: Diagnosis present

## 2021-08-27 DIAGNOSIS — I110 Hypertensive heart disease with heart failure: Secondary | ICD-10-CM | POA: Diagnosis present

## 2021-08-27 DIAGNOSIS — I2510 Atherosclerotic heart disease of native coronary artery without angina pectoris: Secondary | ICD-10-CM | POA: Diagnosis present

## 2021-08-27 DIAGNOSIS — E119 Type 2 diabetes mellitus without complications: Secondary | ICD-10-CM

## 2021-08-27 DIAGNOSIS — R451 Restlessness and agitation: Secondary | ICD-10-CM | POA: Diagnosis present

## 2021-08-27 DIAGNOSIS — N179 Acute kidney failure, unspecified: Secondary | ICD-10-CM

## 2021-08-27 DIAGNOSIS — I10 Essential (primary) hypertension: Secondary | ICD-10-CM

## 2021-08-27 DIAGNOSIS — Z79899 Other long term (current) drug therapy: Secondary | ICD-10-CM

## 2021-08-27 DIAGNOSIS — Z20822 Contact with and (suspected) exposure to covid-19: Secondary | ICD-10-CM | POA: Diagnosis present

## 2021-08-27 DIAGNOSIS — R748 Abnormal levels of other serum enzymes: Secondary | ICD-10-CM

## 2021-08-27 DIAGNOSIS — R778 Other specified abnormalities of plasma proteins: Secondary | ICD-10-CM

## 2021-08-27 DIAGNOSIS — G9341 Metabolic encephalopathy: Secondary | ICD-10-CM

## 2021-08-27 HISTORY — DX: Hyperlipidemia, unspecified: E78.5

## 2021-08-27 HISTORY — DX: Essential (primary) hypertension: I10

## 2021-08-27 HISTORY — DX: Heart failure, unspecified: I50.9

## 2021-08-27 HISTORY — DX: Type 2 diabetes mellitus without complications: E11.9

## 2021-08-27 NOTE — ED Provider Notes (Signed)
[facility] Emergency Department Provider Note  ____________________________________________   Event Date/Time   First MD Initiated Contact with Patient 08/27/21 2335     (approximate)  I have reviewed the triage vital signs and the nursing notes.   HISTORY  Chief Complaint Altered Mental Status    HPI Shanan Mcmiller Missey is a 80 y.o. male with history of hypertension, type 2 diabetes, hyperlipidemia, CHF who presents to the emergency department with his wife for concerns for not feeling well today.  States he woke up this morning and felt poorly and had an episode of vomiting.  He denies any known fevers or chills.  States he was exposed to his daughter-in-law earlier this week and she was just diagnosed with COVID-19.  He denies any chest pain.  Has chronic shortness of breath which is unchanged.  No cough.  No abdominal pain, diarrhea.  No dysuria but did notice his urine appeared dark today.  His wife was concerned because he was confused this morning.  She states that when he woke up he was trying to drink out of his phone and was stating that he was going to remove their kitchen countertops.  She feels like his confusion has improved.  Oxygen level in the low 90s on arrival.  He does not wear oxygen chronically at home.        Past Medical History:  Diagnosis Date   CHF (congestive heart failure) ([place])    Diabetes mellitus without complication ([place])    Hyperlipidemia    Hypertension     Patient Active Problem List   Diagnosis Date Noted   Elevated troponin 08/28/2021   Acute respiratory failure with hypoxia ([place]) 08/28/2021   Severe sepsis ([place]) 08/28/2021   Hypertension    Diabetes mellitus without complication (HCC)    AKI (acute kidney injury) ([place])    Pulmonary nodules    Cholelithiasis    Abnormal LFTs    Acute metabolic encephalopathy     History reviewed. No pertinent surgical history.  Prior to Admission medications   Medication  Sig Start Date End Date Taking? Authorizing Provider  atorvastatin (LIPITOR) 40 MG tablet Take 40 mg by mouth daily. 07/23/21  Yes [provider]  clopidogrel (PLAVIX) 75 MG tablet Take 75 mg by mouth daily. 04/27/21 04/27/22 Yes [provider]  furosemide (LASIX) 20 MG tablet Take 20 mg by mouth daily. 07/24/21  Yes [provider]  gabapentin (NEURONTIN) 300 MG capsule Take 300 mg by mouth 3 (three) times daily. 07/24/21  Yes [provider]  lisinopril (ZESTRIL) 5 MG tablet Take 5 mg by mouth 2 (two) times daily. 04/28/21 04/28/22 Yes [provider]  metFORMIN (GLUCOPHAGE) 1000 MG tablet Take 1,000 mg by mouth at bedtime. 04/22/21 04/17/22 Yes [provider]  metoprolol tartrate (LOPRESSOR) 50 MG tablet Take 50 mg by mouth 2 (two) times daily. 12/09/20 12/09/21 Yes [provider]  morphine (MS CONTIN) 30 MG 12 hr tablet Take 30 mg by mouth 2 (two) times daily as needed. 08/10/21  Yes [provider]  naloxone (NARCAN) nasal spray 4 mg/0.1 mL Place 1 spray into the nose as needed. One spray in either nostril once for known/suspected overdose. May repeat every 2 to 3 minutes in alternating nostrils until EMS arrives. 04/22/21   [provider]  traZODone (DESYREL) 50 MG tablet Take 50-150 mg by mouth at bedtime as needed. 07/01/21   [provider]    Allergies Patient has no  known allergies.  History reviewed. No pertinent family history.  Social History    Review of Systems Constitutional: No fever. Eyes: No visual changes. ENT: No sore throat. Cardiovascular: Denies chest pain. Respiratory: Denies new shortness of breath. Gastrointestinal: + nausea, vomiting.  No diarrhea. Genitourinary: Negative for dysuria. Musculoskeletal: Negative for back pain. Skin: Negative for rash. Neurological: Negative for focal weakness or numbness.  ____________________________________________   PHYSICAL  EXAM:  VITAL SIGNS: ED Triage Vitals  Enc Vitals Group     BP 08/27/21 2324 (!) 102/50     Pulse Rate 08/27/21 2324 (!) 48     Resp 08/27/21 2324 (!) 22     Temp 08/27/21 2324 98.6 F (37 C)     Temp Source 08/27/21 2324 Tympanic     SpO2 08/27/21 2324 92 %     Weight --      Height --      Head Circumference --      Peak Flow --      Pain Score 08/27/21 2322 0     Pain Loc --      Pain Edu? --      Excl. in [place]? --    CONSTITUTIONAL: Alert and oriented and responds appropriately to questions. Well-appearing; well-nourished HEAD: Normocephalic EYES: Conjunctivae clear, pupils appear equal, EOM appear intact ENT: normal nose; moist mucous membranes NECK: Supple, normal ROM CARD: Regular and slightly bradycardic; S1 and S2 appreciated; no murmurs, no clicks, no rubs, no gallops RESP: Normal chest excursion without splinting or tachypnea; breath sounds clear and equal bilaterally; no wheezes, no rhonchi, no rales, no hypoxia or respiratory distress, speaking full sentences ABD/GI: Normal bowel sounds; non-distended; soft, non-tender, no rebound, no guarding, no peritoneal signs, no hepatosplenomegaly BACK: The back appears normal EXT: Normal ROM in all joints; no deformity noted, no edema; no cyanosis, no calf tenderness or calf swelling SKIN: Normal color for age and race; warm; no rash on exposed skin NEURO: Moves all extremities equally, normal speech, no facial asymmetry, normal gait PSYCH: The patient's mood and manner are appropriate.  ____________________________________________   LABS (all labs ordered are listed, but only abnormal results are displayed)  Labs Reviewed  CBC WITH DIFFERENTIAL/PLATELET - Abnormal; Notable for the following components:      Result Value   RBC 3.96 (*)    Hemoglobin 12.3 (*)    HCT 36.7 (*)    Platelets 119 (*)    Lymphs Abs 0.5 (*)    All other components within normal limits  URINALYSIS, COMPLETE (UACMP) WITH MICROSCOPIC - Abnormal;  Notable for the following components:   Color, Urine AMBER (*)    APPearance HAZY (*)    Glucose, UA 100 (*)    Bilirubin Urine LARGE (*)    Ketones, ur 15 (*)    Protein, ur 100 (*)    All other components within normal limits  COMPREHENSIVE METABOLIC PANEL - Abnormal; Notable for the following components:   Glucose, Bld 117 (*)    BUN 29 (*)    Creatinine, Ser 2.12 (*)    Calcium 8.4 (*)    Total Protein 6.4 (*)    AST 259 (*)    ALT 358 (*)    Alkaline Phosphatase 133 (*)    Total Bilirubin 7.1 (*)    GFR, Estimated 31 (*)    All other components within normal limits  BRAIN NATRIURETIC PEPTIDE - Abnormal; Notable for the following components:   B Natriuretic Peptide 459.6 (*)  All other components within normal limits  COMPREHENSIVE METABOLIC PANEL - Abnormal; Notable for the following components:   Glucose, Bld 113 (*)    BUN 27 (*)    Creatinine, Ser 2.03 (*)    Calcium 8.4 (*)    Total Protein 6.1 (*)    AST 237 (*)    ALT 340 (*)    Total Bilirubin 6.5 (*)    GFR, Estimated 33 (*)    All other components within normal limits  D-DIMER, QUANTITATIVE - Abnormal; Notable for the following components:   D-Dimer, Quant 1.38 (*)    All other components within normal limits  ACETAMINOPHEN LEVEL - Abnormal; Notable for the following components:   Acetaminophen (Tylenol), Serum <10 (*)    All other components within normal limits  CBC - Abnormal; Notable for the following components:   RBC 3.92 (*)    Hemoglobin 12.3 (*)    HCT 35.9 (*)    Platelets 120 (*)    All other components within normal limits  CBG MONITORING, ED - Abnormal; Notable for the following components:   Glucose-Capillary 105 (*)    All other components within normal limits  TROPONIN I (HIGH SENSITIVITY) - Abnormal; Notable for the following components:   Troponin I (High Sensitivity) 280 (*)    All other components within normal limits  TROPONIN I (HIGH SENSITIVITY) - Abnormal; Notable for the  following components:   Troponin I (High Sensitivity) 508 (*)    All other components within normal limits  RESP PANEL BY RT-PCR (FLU A&B, COVID) [id number]  URINE CULTURE  CULTURE, BLOOD (ROUTINE X 2)  CULTURE, BLOOD (ROUTINE X 2)  LIPASE, BLOOD  LACTIC ACID, PLASMA  PROCALCITONIN  ETHANOL  TSH  PROTIME-INR  APTT  HEPATITIS PANEL, ACUTE  HEPARIN LEVEL (UNFRACTIONATED)  TROPONIN I (HIGH SENSITIVITY)   ____________________________________________  EKG   EKG Interpretation  Date/Time:  Friday August 28 2021 00:51:49 EST Ventricular Rate:  87 PR Interval:  174 QRS Duration: 114 QT Interval:  378 QTC Calculation: 454 R Axis:   -20 Text Interpretation: Normal sinus rhythm Incomplete right bundle branch block Inferior infarct , age undetermined Possible Anterolateral infarct , age undetermined Abnormal ECG Confirmed by Pryor Curia [phone]) on 08/28/2021 12:57:14 AM        ____________________________________________  RADIOLOGY Jessie Foot Braven Wolk, personally viewed and evaluated these images (plain radiographs) as part of my medical decision making, as well as reviewing the written report by the radiologist.  ED MD interpretation: CT head shows no acute abnormality.  CT of the chest, abdomen pelvis shows cholelithiasis without other acute abnormality.  Official radiology report(s): CT HEAD WO CONTRAST (5MM)  Result Date: 08/28/2021 CLINICAL DATA:  Altered mental status, possible UTI EXAM: CT HEAD WITHOUT CONTRAST TECHNIQUE: Contiguous axial images were obtained from the base of the skull through the vertex without intravenous contrast. COMPARISON:  None. FINDINGS: Brain: No evidence of acute infarction, hemorrhage, hydrocephalus, extra-axial collection or mass lesion/mass effect. Mild atrophic changes are noted commensurate with the patient's given age. Vascular: No hyperdense vessel or unexpected calcification. Skull: Normal. Negative for fracture or focal lesion.  Sinuses/Orbits: No acute finding. Other: None. IMPRESSION: Mild atrophic changes without acute abnormality. Electronically Signed   By: Inez Catalina M.D.   On: 08/28/2021 00:15   DG Chest Portable 1 View  Result Date: 08/28/2021 CLINICAL DATA:  Altered mental status EXAM: PORTABLE CHEST 1 VIEW COMPARISON:  None. FINDINGS: Cardiac shadow is mildly prominent. Postsurgical changes are noted. Mild  vascular congestion is seen. No edema is noted. No focal infiltrate is noted. No bony abnormality is seen. IMPRESSION: Mild vascular congestion without edema. Electronically Signed   By: Inez Catalina M.D.   On: 08/28/2021 00:32   CT CHEST ABDOMEN PELVIS WO CONTRAST  Result Date: 08/28/2021 CLINICAL DATA:  Chronic dyspnea EXAM: CT CHEST, ABDOMEN AND PELVIS WITHOUT CONTRAST TECHNIQUE: Multidetector CT imaging of the chest, abdomen and pelvis was performed following the standard protocol without IV contrast. COMPARISON:  None. FINDINGS: CT CHEST FINDINGS Cardiovascular: Coronary artery bypass grafting has been performed. Cardiac size within normal limits. No pericardial effusion. Central pulmonary arteries are enlarged in keeping with changes of pulmonary arterial hypertension. Moderate atherosclerotic calcification within the thoracic aorta. No aortic aneurysm. Mediastinum/Nodes: No enlarged mediastinal, hilar, or axillary lymph nodes. Thyroid gland, trachea, and esophagus demonstrate no significant findings. Lungs/Pleura: Moderate emphysema. Right basilar pleural thickening noted posteriorly with associated small focus of rounded atelectasis within the posterior basal right lower lobe. Two 4 mm noncalcified pulmonary nodules are seen within the subpleural right middle lobe, axial image # [id number], series 4, indeterminate. No focal pulmonary infiltrate. No pneumothorax or pleural effusion. Musculoskeletal: No chest wall mass or suspicious bone lesions identified. CT ABDOMEN PELVIS FINDINGS Hepatobiliary: Cholelithiasis  without pericholecystic inflammatory change. Liver unremarkable. No intra or extrahepatic biliary ductal dilation. Pancreas: Unremarkable Spleen: Mild splenomegaly is present with the spleen measuring 15.9 cm in greatest dimension. No intrasplenic lesions are seen. Adrenals/Urinary Tract: Adrenal glands are unremarkable. Kidneys are normal, without renal calculi, focal lesion, or hydronephrosis. Bladder is unremarkable. Stomach/Bowel: Moderate to severe descending and sigmoid colonic diverticulosis without superimposed acute inflammatory change. Stomach, small bowel, and large bowel are otherwise unremarkable. Appendix normal. No free intraperitoneal gas or fluid. Vascular/Lymphatic: Moderate aortoiliac atherosclerotic calcification. No aortic aneurysm. No pathologic adenopathy within the abdomen and pelvis. Reproductive: Prostate is unremarkable. Other: Bilateral inguinal hernia repair with mesh has been performed. No recurrent abdominal wall hernia. Musculoskeletal: No acute bone abnormality. No lytic or blastic bone lesion. IMPRESSION: No acute intrathoracic or intra-abdominal pathology identified. Moderate emphysema. Multiple pulmonary nodules. Most severe: 4 mm right solid pulmonary nodule. No routine follow-up imaging is recommended per Fleischner Society Guidelines. These guidelines do not apply to immunocompromised patients and patients with cancer. Follow up in patients with significant comorbidities as clinically warranted. For lung cancer screening, adhere to Lung-RADS guidelines. Reference: Radiology. 2017; 284(1):228-43. Morphologic changes in keeping with pulmonary arterial hypertension. Cholelithiasis. Mild splenomegaly. Moderate to severe distal colonic diverticulosis. No superimposed acute inflammatory change. Aortic Atherosclerosis (ICD10-I70.0) and Emphysema (ICD10-J43.9). Electronically Signed   By: Fidela Salisbury M.D.   On: 08/28/2021 03:19     ____________________________________________   PROCEDURES  Procedure(s) performed (including Critical Care):  Procedures  CRITICAL CARE Performed by: Cyril Mourning Sativa Gelles   Total critical care time: 45 minutes  Critical care time was exclusive of separately billable procedures and treating other patients.  Critical care was necessary to treat or prevent imminent or life-threatening deterioration.  Critical care was time spent personally by me on the following activities: development of treatment plan with patient and/or surrogate as well as nursing, discussions with consultants, evaluation of patient's response to treatment, examination of patient, obtaining history from patient or surrogate, ordering and performing treatments and interventions, ordering and review of laboratory studies, ordering and review of radiographic studies, pulse oximetry and re-evaluation of patient's condition.  ____________________________________________   INITIAL IMPRESSION / ASSESSMENT AND PLAN / ED COURSE  As part of my medical decision making,  I reviewed the following data within the [place] History obtained from family, Nursing notes reviewed and incorporated, Labs reviewed , EKG interpreted , Old EKG reviewed, Old chart reviewed, Radiograph reviewed , Discussed with admitting physician , CT reviewed, and Notes from prior ED visits       Patient here with complaints of not feeling well with 1 episode of vomiting and confusion that has resolved.  Was recently exposed to his daughter-in-law who tested positive for COVID-19.  Discussed with patient that differential includes COVID encephalopathy, influenza, UTI, bacteremia, sepsis, pneumonia.  Does have history of combined CHF but does not appear grossly volume overloaded on exam.  Differential does include CHF, ACS, PE, pneumothorax.  Oxygen level slightly low on arrival.  Doing well on 2 L nasal cannula.  Will ambulate off of oxygen  since he does not wear it chronically.  No history of asthma or COPD.   ED PROGRESS  Patient's oxygen saturation dropped into the upper 80s with ambulation and then when he was sitting down he became more short of breath and dropped as low as 75% on room air.  Placed back on nasal cannula.  Patient will need admission.   Patient's troponin is come back elevated at 280.  He denies any chest discomfort and states he has shortness of breath but this has been stable for weeks to months.  I am concerned with his hypoxia and elevated troponin that this could be a PE.  EKG is nonischemic today.  Will obtain CTA of the chest.  Will give full dose aspirin.  He is on cardiac monitoring.   4:55 AM  Patient's labs show a new AKI compared to labs at [facility] in August 2022 as well as elevated liver function tests.  I am concerned that he is in multisystem organ failure.  A rectal temperature here is 100.1 and he has slight hypotension now concerning for possible sepsis.  We will add on cultures and give 30 mL/kg IV fluid bolus and broad-spectrum antibiotics.  Unclear source currently.  Did obtain a CT of his chest, abdomen pelvis without contrast given his AKI and does not show any acute pathology.  He does have gallstones but no sign of ductal dilatation or cholecystitis.  He denies headache, neck pain or neck stiffness, chest pain, shortness of breath, abdominal pain.  States he did vomit twice.  No diarrhea.  No urinary symptoms.  No confusion currently.  His COVID and flu test are negative.  His D-dimer has come back elevated.  We will obtain a VQ scan of his chest given CT scan shows no acute intrathoracic pathology to explain his severe hypoxia.  His CT scan does show just emphysema however his lungs are clear to auscultation and he denies any known history of emphysema, COPD or asthma.  He has not been wheezing.  Right upper quadrant ultrasound pending given his significantly elevated liver function test and  bilirubin.  I did discuss the case with Dr. Damita Dunnings on-call for the hospitalist service for admission.  She will place admission orders.   6:55 AM Pt's blood pressure has been fluid responsive.  His procalcitonin is elevated but lactic is normal.  His troponins continued to trend upwards but again he has no chest pain or shortness of breath.  We will start him on a heparin infusion.  RUQ [place] pending.  Patient admitted to the hospitalist service. ____________________________________________   FINAL CLINICAL IMPRESSION(S) / ED DIAGNOSES  Final diagnoses:  Acute  respiratory failure with hypoxia (HCC)  Altered mental status, unspecified altered mental status type  NSTEMI (non-ST elevated myocardial infarction) ([facility])  Elevated liver enzymes  AKI (acute kidney injury) [facility])     ED Discharge Orders     None       *Please note:  Kaelon E Bozman was evaluated in Emergency Department on 08/28/2021 for the symptoms described in the history of present illness. He was evaluated in the context of the global COVID-19 pandemic, which necessitated consideration that the patient might be at risk for infection with the SARS-CoV-2 virus that causes COVID-19. Institutional protocols and algorithms that pertain to the evaluation of patients at risk for COVID-19 are in a state of rapid change based on information released by regulatory bodies including the CDC and federal and state organizations. These policies and algorithms were followed during the patient's care in the ED.  Some ED evaluations and interventions may be delayed as a result of limited staffing during and the pandemic.*   Note:  This document was prepared using Dragon voice recognition software and may include unintentional dictation errors.    Daily Doe, Delice Bison, DO 08/28/21 [phone]

## 2021-08-27 NOTE — ED Notes (Signed)
First interaction with pt. Pt alert to self and place. Pt disoriented to time and situation. Pt connected to all monitors. Bed alarm placed on pt bed.

## 2021-08-27 NOTE — ED Triage Notes (Signed)
BIB EMS from home. Pt lives with wife. Normally a&o.  Today increasingly confused.  Wife found him tonight cleaning out kitchen and trying to remove counter tops.  Told EMS he was just staying with her tonight but didn't live there.  Pt did state he has had a foul smell to his urine lately.  Pt was placed on 3L [place] by EMS for sats of high 80s.  Does not normally wear O2 at home

## 2021-08-27 NOTE — ED Notes (Signed)
ED Provider at bedside. 

## 2021-08-28 ENCOUNTER — Encounter: Payer: Self-pay | Admitting: Emergency Medicine

## 2021-08-28 ENCOUNTER — Emergency Department: Payer: PPO

## 2021-08-28 ENCOUNTER — Inpatient Hospital Stay: Payer: PPO

## 2021-08-28 DIAGNOSIS — R748 Abnormal levels of other serum enzymes: Secondary | ICD-10-CM | POA: Diagnosis not present

## 2021-08-28 DIAGNOSIS — Z9861 Coronary angioplasty status: Secondary | ICD-10-CM | POA: Diagnosis not present

## 2021-08-28 DIAGNOSIS — I110 Hypertensive heart disease with heart failure: Secondary | ICD-10-CM | POA: Diagnosis present

## 2021-08-28 DIAGNOSIS — A419 Sepsis, unspecified organism: Secondary | ICD-10-CM | POA: Diagnosis present

## 2021-08-28 DIAGNOSIS — E119 Type 2 diabetes mellitus without complications: Secondary | ICD-10-CM

## 2021-08-28 DIAGNOSIS — R911 Solitary pulmonary nodule: Secondary | ICD-10-CM | POA: Diagnosis present

## 2021-08-28 DIAGNOSIS — J439 Emphysema, unspecified: Secondary | ICD-10-CM | POA: Diagnosis not present

## 2021-08-28 DIAGNOSIS — R0602 Shortness of breath: Secondary | ICD-10-CM | POA: Diagnosis not present

## 2021-08-28 DIAGNOSIS — Z79899 Other long term (current) drug therapy: Secondary | ICD-10-CM | POA: Diagnosis not present

## 2021-08-28 DIAGNOSIS — R7989 Other specified abnormal findings of blood chemistry: Secondary | ICD-10-CM | POA: Diagnosis not present

## 2021-08-28 DIAGNOSIS — Z7984 Long term (current) use of oral hypoglycemic drugs: Secondary | ICD-10-CM | POA: Diagnosis not present

## 2021-08-28 DIAGNOSIS — Z7902 Long term (current) use of antithrombotics/antiplatelets: Secondary | ICD-10-CM | POA: Diagnosis not present

## 2021-08-28 DIAGNOSIS — J9601 Acute respiratory failure with hypoxia: Secondary | ICD-10-CM | POA: Diagnosis present

## 2021-08-28 DIAGNOSIS — K7200 Acute and subacute hepatic failure without coma: Secondary | ICD-10-CM | POA: Diagnosis present

## 2021-08-28 DIAGNOSIS — Z20822 Contact with and (suspected) exposure to covid-19: Secondary | ICD-10-CM | POA: Diagnosis present

## 2021-08-28 DIAGNOSIS — I5042 Chronic combined systolic (congestive) and diastolic (congestive) heart failure: Secondary | ICD-10-CM | POA: Diagnosis present

## 2021-08-28 DIAGNOSIS — R778 Other specified abnormalities of plasma proteins: Secondary | ICD-10-CM

## 2021-08-28 DIAGNOSIS — K8020 Calculus of gallbladder without cholecystitis without obstruction: Secondary | ICD-10-CM

## 2021-08-28 DIAGNOSIS — R451 Restlessness and agitation: Secondary | ICD-10-CM | POA: Diagnosis not present

## 2021-08-28 DIAGNOSIS — S0990XA Unspecified injury of head, initial encounter: Secondary | ICD-10-CM | POA: Diagnosis not present

## 2021-08-28 DIAGNOSIS — E882 Lipomatosis, not elsewhere classified: Secondary | ICD-10-CM | POA: Diagnosis not present

## 2021-08-28 DIAGNOSIS — I10 Essential (primary) hypertension: Secondary | ICD-10-CM

## 2021-08-28 DIAGNOSIS — R4182 Altered mental status, unspecified: Secondary | ICD-10-CM | POA: Diagnosis not present

## 2021-08-28 DIAGNOSIS — G9341 Metabolic encephalopathy: Secondary | ICD-10-CM | POA: Diagnosis present

## 2021-08-28 DIAGNOSIS — E785 Hyperlipidemia, unspecified: Secondary | ICD-10-CM | POA: Diagnosis present

## 2021-08-28 DIAGNOSIS — R6520 Severe sepsis without septic shock: Secondary | ICD-10-CM | POA: Diagnosis present

## 2021-08-28 DIAGNOSIS — N179 Acute kidney failure, unspecified: Secondary | ICD-10-CM

## 2021-08-28 DIAGNOSIS — I959 Hypotension, unspecified: Secondary | ICD-10-CM | POA: Diagnosis present

## 2021-08-28 DIAGNOSIS — I2510 Atherosclerotic heart disease of native coronary artery without angina pectoris: Secondary | ICD-10-CM | POA: Diagnosis present

## 2021-08-28 DIAGNOSIS — R918 Other nonspecific abnormal finding of lung field: Secondary | ICD-10-CM

## 2021-08-28 DIAGNOSIS — I517 Cardiomegaly: Secondary | ICD-10-CM | POA: Diagnosis not present

## 2021-08-28 DIAGNOSIS — Z951 Presence of aortocoronary bypass graft: Secondary | ICD-10-CM | POA: Diagnosis not present

## 2021-08-28 DIAGNOSIS — K8021 Calculus of gallbladder without cholecystitis with obstruction: Secondary | ICD-10-CM | POA: Diagnosis present

## 2021-08-28 DIAGNOSIS — J3489 Other specified disorders of nose and nasal sinuses: Secondary | ICD-10-CM | POA: Diagnosis not present

## 2021-08-28 DIAGNOSIS — B9620 Unspecified Escherichia coli [E. coli] as the cause of diseases classified elsewhere: Secondary | ICD-10-CM | POA: Diagnosis present

## 2021-08-28 LAB — CBC
HCT: 35.9 % — ABNORMAL LOW (ref 39.0–52.0)
Hemoglobin: 12.3 g/dL — ABNORMAL LOW (ref 13.0–17.0)
MCH: 31.4 pg (ref 26.0–34.0)
MCHC: 34.3 g/dL (ref 30.0–36.0)
MCV: 91.6 fL (ref 80.0–100.0)
Platelets: 120 10*3/uL — ABNORMAL LOW (ref 150–400)
RBC: 3.92 MIL/uL — ABNORMAL LOW (ref 4.22–5.81)
RDW: 14.1 % (ref 11.5–15.5)
WBC: 7.3 10*3/uL (ref 4.0–10.5)
nRBC: 0 % (ref 0.0–0.2)

## 2021-08-28 LAB — RESP PANEL BY RT-PCR (FLU A&B, COVID) ARPGX2
Influenza A by PCR: NEGATIVE
Influenza B by PCR: NEGATIVE
SARS Coronavirus 2 by RT PCR: NEGATIVE

## 2021-08-28 LAB — URINE DRUG SCREEN, QUALITATIVE (ARMC ONLY)
Amphetamines, Ur Screen: NOT DETECTED
Barbiturates, Ur Screen: NOT DETECTED
Benzodiazepine, Ur Scrn: NOT DETECTED
Cannabinoid 50 Ng, Ur ~~LOC~~: NOT DETECTED
Cocaine Metabolite,Ur ~~LOC~~: NOT DETECTED
MDMA (Ecstasy)Ur Screen: NOT DETECTED
Methadone Scn, Ur: NOT DETECTED
Opiate, Ur Screen: POSITIVE — AB
Phencyclidine (PCP) Ur S: NOT DETECTED
Tricyclic, Ur Screen: NOT DETECTED

## 2021-08-28 LAB — URINALYSIS, COMPLETE (UACMP) WITH MICROSCOPIC
Bacteria, UA: NONE SEEN
Glucose, UA: 100 mg/dL — AB
Hgb urine dipstick: NEGATIVE
Ketones, ur: 15 mg/dL — AB
Leukocytes,Ua: NEGATIVE
Nitrite: NEGATIVE
Protein, ur: 100 mg/dL — AB
Specific Gravity, Urine: 1.02 (ref 1.005–1.030)
pH: 5.5 (ref 5.0–8.0)

## 2021-08-28 LAB — COMPREHENSIVE METABOLIC PANEL
ALT: 358 U/L — ABNORMAL HIGH (ref 0–44)
ALT: 340 U/L — ABNORMAL HIGH (ref 0–44)
AST: 259 U/L — ABNORMAL HIGH (ref 15–41)
AST: 237 U/L — ABNORMAL HIGH (ref 15–41)
Albumin: 3.7 g/dL (ref 3.5–5.0)
Albumin: 3.5 g/dL (ref 3.5–5.0)
Alkaline Phosphatase: 133 U/L — ABNORMAL HIGH (ref 38–126)
Alkaline Phosphatase: 124 U/L (ref 38–126)
Anion gap: 8 (ref 5–15)
Anion gap: 7 (ref 5–15)
BUN: 29 mg/dL — ABNORMAL HIGH (ref 8–23)
BUN: 27 mg/dL — ABNORMAL HIGH (ref 8–23)
CO2: 27 mmol/L (ref 22–32)
CO2: 28 mmol/L (ref 22–32)
Calcium: 8.4 mg/dL — ABNORMAL LOW (ref 8.9–10.3)
Calcium: 8.4 mg/dL — ABNORMAL LOW (ref 8.9–10.3)
Chloride: 101 mmol/L (ref 98–111)
Chloride: 102 mmol/L (ref 98–111)
Creatinine, Ser: 2.12 mg/dL — ABNORMAL HIGH (ref 0.61–1.24)
Creatinine, Ser: 2.03 mg/dL — ABNORMAL HIGH (ref 0.61–1.24)
GFR, Estimated: 31 mL/min — ABNORMAL LOW (ref >60)
GFR, Estimated: 33 mL/min — ABNORMAL LOW (ref >60)
Glucose, Bld: 117 mg/dL — ABNORMAL HIGH (ref 70–99)
Glucose, Bld: 113 mg/dL — ABNORMAL HIGH (ref 70–99)
Potassium: 3.8 mmol/L (ref 3.5–5.1)
Potassium: 4.1 mmol/L (ref 3.5–5.1)
Sodium: 136 mmol/L (ref 135–145)
Sodium: 137 mmol/L (ref 135–145)
Total Bilirubin: 7.1 mg/dL — ABNORMAL HIGH (ref 0.3–1.2)
Total Bilirubin: 6.5 mg/dL — ABNORMAL HIGH (ref 0.3–1.2)
Total Protein: 6.4 g/dL — ABNORMAL LOW (ref 6.5–8.1)
Total Protein: 6.1 g/dL — ABNORMAL LOW (ref 6.5–8.1)

## 2021-08-28 LAB — CBC WITH DIFFERENTIAL/PLATELET
Abs Immature Granulocytes: 0.05 10*3/uL (ref 0.00–0.07)
Basophils Absolute: 0 10*3/uL (ref 0.0–0.1)
Basophils Relative: 0 %
Eosinophils Absolute: 0 10*3/uL (ref 0.0–0.5)
Eosinophils Relative: 0 %
HCT: 36.7 % — ABNORMAL LOW (ref 39.0–52.0)
Hemoglobin: 12.3 g/dL — ABNORMAL LOW (ref 13.0–17.0)
Immature Granulocytes: 1 %
Lymphocytes Relative: 6 %
Lymphs Abs: 0.5 10*3/uL — ABNORMAL LOW (ref 0.7–4.0)
MCH: 31.1 pg (ref 26.0–34.0)
MCHC: 33.5 g/dL (ref 30.0–36.0)
MCV: 92.7 fL (ref 80.0–100.0)
Monocytes Absolute: 0.6 10*3/uL (ref 0.1–1.0)
Monocytes Relative: 7 %
Neutro Abs: 6.5 10*3/uL (ref 1.7–7.7)
Neutrophils Relative %: 86 %
Platelets: 119 10*3/uL — ABNORMAL LOW (ref 150–400)
RBC: 3.96 MIL/uL — ABNORMAL LOW (ref 4.22–5.81)
RDW: 14.1 % (ref 11.5–15.5)
Smear Review: DECREASED
WBC: 7.6 10*3/uL (ref 4.0–10.5)
nRBC: 0 % (ref 0.0–0.2)

## 2021-08-28 LAB — BRAIN NATRIURETIC PEPTIDE: B Natriuretic Peptide: 459.6 pg/mL — ABNORMAL HIGH (ref 0.0–100.0)

## 2021-08-28 LAB — AMMONIA: Ammonia: 42 umol/L — ABNORMAL HIGH (ref 9–35)

## 2021-08-28 LAB — TROPONIN I (HIGH SENSITIVITY)
Troponin I (High Sensitivity): 280 ng/L (ref <18)
Troponin I (High Sensitivity): 390 ng/L (ref <18)
Troponin I (High Sensitivity): 508 ng/L (ref <18)

## 2021-08-28 LAB — LACTIC ACID, PLASMA: Lactic Acid, Venous: 1 mmol/L (ref 0.5–1.9)

## 2021-08-28 LAB — HEPATIC FUNCTION PANEL
ALT: 264 U/L — ABNORMAL HIGH (ref 0–44)
AST: 160 U/L — ABNORMAL HIGH (ref 15–41)
Albumin: 3.2 g/dL — ABNORMAL LOW (ref 3.5–5.0)
Alkaline Phosphatase: 114 U/L (ref 38–126)
Bilirubin, Direct: 3.2 mg/dL — ABNORMAL HIGH (ref 0.0–0.2)
Indirect Bilirubin: 2.4 mg/dL — ABNORMAL HIGH (ref 0.3–0.9)
Total Bilirubin: 5.6 mg/dL — ABNORMAL HIGH (ref 0.3–1.2)
Total Protein: 5.8 g/dL — ABNORMAL LOW (ref 6.5–8.1)

## 2021-08-28 LAB — CBG MONITORING, ED
Glucose-Capillary: 105 mg/dL — ABNORMAL HIGH (ref 70–99)
Glucose-Capillary: 108 mg/dL — ABNORMAL HIGH (ref 70–99)
Glucose-Capillary: 109 mg/dL — ABNORMAL HIGH (ref 70–99)
Glucose-Capillary: 117 mg/dL — ABNORMAL HIGH (ref 70–99)

## 2021-08-28 LAB — HEPATITIS PANEL, ACUTE
HCV Ab: NONREACTIVE
Hep A IgM: NONREACTIVE
Hep B C IgM: NONREACTIVE
Hepatitis B Surface Ag: NONREACTIVE

## 2021-08-28 LAB — PROCALCITONIN: Procalcitonin: 5.73 ng/mL

## 2021-08-28 LAB — APTT: aPTT: 36 seconds (ref 24–36)

## 2021-08-28 LAB — LIPASE, BLOOD: Lipase: 25 U/L (ref 11–51)

## 2021-08-28 LAB — PROTIME-INR
INR: 1.2 (ref 0.8–1.2)
Prothrombin Time: 15.2 seconds (ref 11.4–15.2)

## 2021-08-28 LAB — ACETAMINOPHEN LEVEL: Acetaminophen (Tylenol), Serum: <10 ug/mL — ABNORMAL LOW (ref 10–30)

## 2021-08-28 LAB — ETHANOL: Alcohol, Ethyl (B): <10 mg/dL (ref <10)

## 2021-08-28 LAB — D-DIMER, QUANTITATIVE: D-Dimer, Quant: 1.38 ug/mL-FEU — ABNORMAL HIGH (ref 0.00–0.50)

## 2021-08-28 LAB — TSH: TSH: 0.676 u[IU]/mL (ref 0.350–4.500)

## 2021-08-28 MED ORDER — TECHNETIUM TO 99M ALBUMIN AGGREGATED
4.1200 | Freq: Once | INTRAVENOUS | Status: AC | PRN
  Administered 2021-08-28: 11:00:00 4.12 via INTRAVENOUS

## 2021-08-28 MED ORDER — HALOPERIDOL LACTATE 5 MG/ML IJ SOLN
5.0000 mg | Freq: Four times a day (QID) | INTRAMUSCULAR | Status: DC | PRN
  Administered 2021-08-28 – 2021-08-30 (×5): 5 mg via INTRAMUSCULAR
  Filled 2021-08-28 (×4): qty 1

## 2021-08-28 MED ORDER — ONDANSETRON HCL 4 MG PO TABS: 4.0000 mg | ORAL_TABLET | Freq: Four times a day (QID) | ORAL | Status: DC | PRN

## 2021-08-28 MED ORDER — GABAPENTIN 100 MG PO CAPS
100.0000 mg | ORAL_CAPSULE | Freq: Three times a day (TID) | ORAL | Status: DC
  Administered 2021-08-28 – 2021-09-01 (×12): 100 mg via ORAL
  Filled 2021-08-28 (×12): qty 1

## 2021-08-28 MED ORDER — VANCOMYCIN HCL IN DEXTROSE 1-5 GM/200ML-% IV SOLN: 1000.0000 mg | Freq: Once | INTRAVENOUS | Status: DC

## 2021-08-28 MED ORDER — HEPARIN (PORCINE) 25000 UT/250ML-% IV SOLN
950.0000 [IU]/h | INTRAVENOUS | Status: DC
Start: 2021-08-28 — End: 2021-08-28
  Administered 2021-08-28: 07:00:00 950 [IU]/h via INTRAVENOUS
  Filled 2021-08-28: qty 250

## 2021-08-28 MED ORDER — SODIUM CHLORIDE 0.9 % IV SOLN
2.0000 g | Freq: Once | INTRAVENOUS | Status: AC
  Administered 2021-08-28: 05:00:00 2 g via INTRAVENOUS
  Filled 2021-08-28: qty 2

## 2021-08-28 MED ORDER — HEPARIN BOLUS VIA INFUSION
4000.0000 [IU] | Freq: Once | INTRAVENOUS | Status: AC
  Administered 2021-08-28: 07:00:00 4000 [IU] via INTRAVENOUS
  Filled 2021-08-28: qty 4000

## 2021-08-28 MED ORDER — LACTATED RINGERS IV SOLN: INTRAVENOUS | Status: AC

## 2021-08-28 MED ORDER — SODIUM CHLORIDE 0.9 % IV BOLUS (SEPSIS)
1000.0000 mL | Freq: Once | INTRAVENOUS | Status: AC
  Administered 2021-08-28: 04:00:00 1000 mL via INTRAVENOUS

## 2021-08-28 MED ORDER — MORPHINE SULFATE ER 30 MG PO TBCR
30.0000 mg | EXTENDED_RELEASE_TABLET | Freq: Two times a day (BID) | ORAL | Status: DC | PRN
  Administered 2021-08-28: 12:00:00 30 mg via ORAL
  Filled 2021-08-28: qty 1

## 2021-08-28 MED ORDER — METRONIDAZOLE 500 MG/100ML IV SOLN
500.0000 mg | Freq: Once | INTRAVENOUS | Status: AC
  Administered 2021-08-28: 06:00:00 500 mg via INTRAVENOUS
  Filled 2021-08-28: qty 100

## 2021-08-28 MED ORDER — SODIUM CHLORIDE 0.9 % IV SOLN
2.0000 g | Freq: Two times a day (BID) | INTRAVENOUS | Status: DC
  Administered 2021-08-28 – 2021-08-29 (×2): 2 g via INTRAVENOUS
  Filled 2021-08-28 (×2): qty 2

## 2021-08-28 MED ORDER — INSULIN ASPART 100 UNIT/ML IJ SOLN
0.0000 [IU] | Freq: Every day | INTRAMUSCULAR | Status: DC
  Filled 2021-08-28: qty 1

## 2021-08-28 MED ORDER — LORAZEPAM 2 MG/ML IJ SOLN
2.0000 mg | Freq: Once | INTRAMUSCULAR | Status: AC
  Administered 2021-08-28: 22:00:00 2 mg via INTRAVENOUS
  Filled 2021-08-28: qty 1

## 2021-08-28 MED ORDER — ENOXAPARIN SODIUM 40 MG/0.4ML IJ SOSY: 40.0000 mg | PREFILLED_SYRINGE | INTRAMUSCULAR | Status: DC

## 2021-08-28 MED ORDER — ONDANSETRON HCL 4 MG/2ML IJ SOLN: 4.0000 mg | Freq: Four times a day (QID) | INTRAMUSCULAR | Status: DC | PRN

## 2021-08-28 MED ORDER — LACTATED RINGERS IV BOLUS (SEPSIS)
1500.0000 mL | Freq: Once | INTRAVENOUS | Status: AC
  Administered 2021-08-28: 05:00:00 1500 mL via INTRAVENOUS

## 2021-08-28 MED ORDER — CLOPIDOGREL BISULFATE 75 MG PO TABS
75.0000 mg | ORAL_TABLET | Freq: Every day | ORAL | Status: DC
  Administered 2021-08-28 – 2021-09-01 (×4): 75 mg via ORAL
  Filled 2021-08-28 (×4): qty 1

## 2021-08-28 MED ORDER — VANCOMYCIN HCL 1750 MG/350ML IV SOLN: 1750.0000 mg | INTRAVENOUS | Status: DC

## 2021-08-28 MED ORDER — INSULIN ASPART 100 UNIT/ML IJ SOLN
0.0000 [IU] | Freq: Three times a day (TID) | INTRAMUSCULAR | Status: DC
  Administered 2021-08-29: 16:00:00 2 [IU] via SUBCUTANEOUS
  Administered 2021-08-30 – 2021-08-31 (×4): 1 [IU] via SUBCUTANEOUS
  Administered 2021-08-31: 17:00:00 3 [IU] via SUBCUTANEOUS
  Administered 2021-08-31: 09:00:00 2 [IU] via SUBCUTANEOUS
  Administered 2021-09-01: 08:00:00 1 [IU] via SUBCUTANEOUS
  Filled 2021-08-28 (×8): qty 1

## 2021-08-28 MED ORDER — SODIUM CHLORIDE 0.9 % IV SOLN: 2.0000 g | Freq: Two times a day (BID) | INTRAVENOUS | Status: DC

## 2021-08-28 MED ORDER — HEPARIN SODIUM (PORCINE) 5000 UNIT/ML IJ SOLN
5000.0000 [IU] | Freq: Three times a day (TID) | INTRAMUSCULAR | Status: DC
  Administered 2021-08-28 – 2021-08-29 (×3): 5000 [IU] via SUBCUTANEOUS
  Filled 2021-08-28 (×3): qty 1

## 2021-08-28 MED ORDER — ATORVASTATIN CALCIUM 20 MG PO TABS: 40.0000 mg | ORAL_TABLET | Freq: Every day | ORAL | Status: DC

## 2021-08-28 MED ORDER — METRONIDAZOLE 500 MG/100ML IV SOLN
500.0000 mg | Freq: Two times a day (BID) | INTRAVENOUS | Status: DC
  Administered 2021-08-28 – 2021-09-01 (×8): 500 mg via INTRAVENOUS
  Filled 2021-08-28 (×8): qty 100

## 2021-08-28 MED ORDER — ASPIRIN 81 MG PO CHEW
324.0000 mg | CHEWABLE_TABLET | Freq: Once | ORAL | Status: AC
  Administered 2021-08-28: 01:00:00 324 mg via ORAL
  Filled 2021-08-28: qty 4

## 2021-08-28 MED ORDER — LORAZEPAM 2 MG/ML IJ SOLN
2.0000 mg | INTRAMUSCULAR | Status: AC
  Administered 2021-08-28: 14:00:00 2 mg via INTRAVENOUS
  Filled 2021-08-28: qty 1

## 2021-08-28 MED ORDER — VANCOMYCIN HCL 1750 MG/350ML IV SOLN
1750.0000 mg | Freq: Once | INTRAVENOUS | Status: AC
  Administered 2021-08-28: 06:00:00 1750 mg via INTRAVENOUS
  Filled 2021-08-28: qty 350

## 2021-08-28 NOTE — ED Notes (Signed)
Pt sleeping at this time. Pt is in NAD.

## 2021-08-28 NOTE — ED Notes (Signed)
Pt attempting to leave, pt ripped out IV and is actively bleeding. Pt dressed himself in personal clothing and states "I need to get out of here". Katie RN, Claiborne Billings RN, and this RN attempted to redirect pt back to room. Pt given lunch tray and continued to walk down the hallway. Charge Nurse Aldona Bar and attending notified. See MAR for details.

## 2021-08-28 NOTE — ED Notes (Signed)
Phlebotomy at bedside.

## 2021-08-28 NOTE — ED Notes (Signed)
Per Micromedex, Flagyl and vancomycin are compatible. Medication will be run together

## 2021-08-28 NOTE — ED Notes (Signed)
Pt resting in bed asleep. Respirations even and unlabored. RN aware to let this Charge RN know of any changes. Cal bell within reach, bed alarm on and bed in lowest position.

## 2021-08-28 NOTE — Progress Notes (Signed)
PHARMACY -  BRIEF ANTIBIOTIC NOTE   Pharmacy has received consult(s) for Vancomycin , Cefepime from an ED provider.  The patient's profile has been reviewed for ht/wt/allergies/indication/available labs.    One time order(s) placed for Vancomycin 1750 mg IV X 1 and Cefepime 2 gm IV X 1  Further antibiotics/pharmacy consults should be ordered by admitting physician if indicated.                       Thank you, Amyjo Mizrachi D 08/28/2021  4:27 AM

## 2021-08-28 NOTE — ED Notes (Signed)
Pt returned from CT °

## 2021-08-28 NOTE — ED Notes (Signed)
Patient is resting comfortably with no acute distress.

## 2021-08-28 NOTE — ED Notes (Signed)
Pt ambulatory trial completed with pt on room air. Patients oxygen began at 98% on room air, when ambulating pt dropped down to 88%. When pt sits back down in bed oxygen drops to 75% and pt complains of shortness of breathe. Trial completed twice to verify results. Pts desats to 75% both times trial is completed. Provider notified of results

## 2021-08-28 NOTE — ED Notes (Signed)
Patient transported to CT 

## 2021-08-28 NOTE — ED Notes (Signed)
Pt is very anxious and repeatedly stating I'm ready, I'm ready, I'm ready.

## 2021-08-28 NOTE — ED Notes (Signed)
Ultrasound at bedside

## 2021-08-28 NOTE — Progress Notes (Addendum)
PROGRESS NOTE    Clifford Martin  VZC:[id number] DOB: 13-May-1941 DOA: 08/27/2021 PCP: [facility]    Brief Narrative:  Clifford Martin is a 80 year old male with past medical history significant for essential hypertension, type 2 diabetes mellitus, chronic systolic congestive heart failure, CAD s/p PCI/CABG who presented to [facility] ED on 12/22 via EMS with confusion.  Wife contributed to HPI and states patient was found trying to clean out the kitchen and remove the countertops.  Patient did report discolored urine recently.  He reported history of chronic shortness of breath related to his CHF. Patient denied cough, no fever/chills, no abdominal pain, no diarrhea.  On EMS arrival, patient was found to be hypoxic with SPO2 in the high 80s and placed on supplemental oxygen.  In the ED, temperature 100.1 F, HR 91, RR 27, BP 99/52, SPO2 94% on 3 L nasal cannula.  WBC 7.6, hemoglobin 13.3, platelets 119.  Sodium 136, potassium 3.8, chloride 101, CO2 27, glucose 117, BUN 29, creatinine 2.12, AST 259, ALT 358, total bilirubin 7.1.  BNP 459.6.  Lactic acid 1.0, procalcitonin 5.73.  High sensitive troponin 280> 508> 390.  COVID-19 PCR negative.  Influenza A/B PCR negative.  Urinalysis with amber-colored urine, 100 glucose, large bilirubin, 100 protein; no bacteria and 11-20 WBCs.  EtOH level less than 10.  Acetaminophen level less than 10.  Chest x-ray with mild vascular congestion without edema.  CT head without contrast with mild atrophic changes without acute abnormality.  CT chest/abdomen/pelvis with no acute intrathoracic or intra-abdominal pathology, notable for moderate emphysema with multiple pulmonary nodules most severe 4 mm right solid pulmonary nodule and No intra/extrahepatic biliary ductal dilation.  Given patient's confusion, hypertension, low-grade fever; patient was started on empiric antibiotics for sepsis of unclear etiology.  Hospitalist service consulted for further  evaluation management.   Assessment & Plan:   Principal Problem:   Acute metabolic encephalopathy Active Problems:   Hypertension   Diabetes mellitus without complication (HCC)   AKI (acute kidney injury) ([place])   Pulmonary nodules   Cholelithiasis   Abnormal LFTs   Elevated troponin   Acute respiratory failure with hypoxia (HCC)   Severe sepsis (HCC)   Acute metabolic encephalopathy, POA: Improving Patient presenting with confusion, normally alert and oriented per family.  No leukocytosis, lactic acid within normal limits.  Elevated procalcitonin level.  Unclear etiology but suspicion for infectious process versus hypotension versus toxic effects with liver failure. --Appears now back close to his normal baseline --Check UDS and ammonia level --Continue treatment as below  Sepsis, POA Patient met sepsis criteria on admission with tachypnea, tachycardia, fever, elevated procalcitonin in the setting of end organ damage with acute renal failure, acute liver failure, acute metabolic encephalopathy and MAP <70.  Chest x-ray with mild pulmonary vascular congestion, otherwise unrevealing.  CT chest/abdomen/pelvis with no acute intrathoracic or intra-abdominal findings. --Unclear etiology at this point --Blood cultures x2: Pending --Urine culture: Pending --Continue vancomycin, pharmacy consulted for dosing/monitoring --Cefepime --Metronidazole --CBC daily --Trend procalcitonin --Check MRSA PCR, if negative will discontinue vancomycin --Supportive care  Hypotension Hx hypertension Patient was notably hypotensive on presentation.  Unclear etiology, sepsis as above versus overly controlled BP. --Continue to hold home hypertensives for now --Monitor blood pressure closely  Elevated troponin likely secondary to type II demand ischemia High sensitive troponin 280>508>390.  EKG with normal sinus rhythm without dynamic changes.  Denies chest pain.  Initially was started on a heparin  drip, will discontinue as this  is likely related to type II demand ischemia in the setting of hypotension which is improving. --Continue monitor on telemetry  Acute liver injury with elevated bilirubin Patient presenting with acute confusion as above.  Was noted to have elevated LFTs on presentation with elevated total bilirubin.  CT abdomen/pelvis with no intrahepatic extrahepatic biliary ductal dilation and right upper quadrant ultrasound unrevealing.  EtOH level less than 10, Tylenol level less than 10.  Unclear etiology but suspect possible mild shock liver from hypotension. --AST 259>237 --ALT 358>340 --Tbili 7.1>6.5 --Acute hepatitis panel negative. --Avoid hepatotoxins; holding home atorvastatin --Holding antihypertensives as above --Check ammonia level --Check MRCP to ensure no pancreatic lesion causing elevated bilirubin level --Monitor LFTs daily  Acute renal failure Baseline creatinine 0.98 on 04/22/2021.  Creatinine elevated 2.12 on presentation.  Suspect prerenal azotemia from dehydration versus ATN from hypotension. --Cr 2.12>2.03 --Continue IV fluid hydration --Holding home lisinopril --Avoid nephrotoxins, renally dose all medications --Repeat BMP in a.m.  Elevated D-dimer Patient was found hypoxic by EMS and placed on supplemental oxygen. --Nuclear medicine VQ scan: Pending  Chronic systolic/diastolic congestive heart failure Follows with cardiology at [facility].  Recent TTE 04/21/2021 with LVEF 40-45%, LV mildly dilated, LV systolic function moderately decreased, grade 2 diastolic dysfunction, moderate MR, LA/RA mildly dilated, RV mildly dilated.  Home medication regimen includes metoprolol tartrate 50 mg p.o. twice daily, lisinopril 5 mg p.o. twice daily, furosemide 20 mg p.o. daily.  Chest x-ray on admission with mild pulmonary edema and was notably hypotensive. --Hold home and hypertensive regimen and watch BP closely --Strict I's and O's Daily weights  Hyperlipidemia:  Atorvastatin 40 mg p.o. daily  Type 2 diabetes mellitus On metformin outpatient. --SSI for coverage --CBGs qAC/HS  CAD s/p PCI/CABG --Continue Plavix  -- Holding statin as above due to acute liver injury   DVT prophylaxis:   Heparin   Code Status: Full Code Family Communication: No family present at bedside, attempted to update patient's spouse, Clifford Martin via telephone unsuccessful with no answer.  Disposition Plan:  Level of care: Progressive Status is: Inpatient  Remains inpatient appropriate because: Acute metabolic encephalopathy, IV antibiotics, further need of diagnostic testing    Consultants:  None  Procedures:  None  Antimicrobials:  Vancomycin 12/23>> Cefepime 12/23>> Metronidazole 12/23>>    Subjective: Patient seen examined bedside, resting comfortably.  Remains in ED holding area.  No family present at bedside.  Confusion appears to be now resolved as he is alert and oriented.  No complaints or concerns this morning; and recent development of dark urine.  Denies headache, no dizziness, no chest pain, no palpitations, no shortness of breath, no abdominal pain, no weakness, no fatigue, no paresthesias.  No acute events overnight per nursing staff.  Objective: Vitals:   08/28/21 0502 08/28/21 0530 08/28/21 0600 08/28/21 0645  BP: (!) 110/56 (!) 119/46 (!) 119/53 (!) 127/45  Pulse:  (!) 101 94   Resp:      Temp:      TempSrc:      SpO2:  96% 94%   Weight:      Height:        Intake/Output Summary (Last 24 hours) at 08/28/2021 1024 Last data filed at 08/28/2021 2500 Gross per 24 hour  Intake 1684.63 ml  Output --  Net 1684.63 ml   Filed Weights   08/28/21 0302  Weight: 79.4 kg    Examination:  General exam: Appears calm and comfortable  Respiratory system: Clear to auscultation. Respiratory effort normal.  On 2  L nasal cannula Cardiovascular system: S1 & S2 heard, RRR. No JVD, murmurs, rubs, gallops or clicks. No pedal edema. Gastrointestinal  system: Abdomen is nondistended, soft and nontender. No organomegaly or masses felt. Normal bowel sounds heard. Central nervous system: Alert and oriented to person/place/time and situation. No focal neurological deficits. Extremities: Symmetric 5 x 5 power. Skin: Slightly jaundiced skin, otherwise no rashes, lesions or ulcers Psychiatry: Judgement and insight appear normal. Mood & affect appropriate.     Data Reviewed: I have personally reviewed following labs and imaging studies  CBC: Recent Labs  Lab 08/27/21 2355 08/28/21 0522  WBC 7.6 7.3  NEUTROABS 6.5  --   HGB 12.3* 12.3*  HCT 36.7* 35.9*  MCV 92.7 91.6  PLT 119* 413*   Basic Metabolic Panel: Recent Labs  Lab 08/28/21 0101 08/28/21 0145  NA 136 137  K 3.8 4.1  CL 101 102  CO2 27 28  GLUCOSE 117* 113*  BUN 29* 27*  CREATININE 2.12* 2.03*  CALCIUM 8.4* 8.4*   GFR: Estimated Creatinine Clearance: 31 mL/min (A) (by C-G formula based on SCr of 2.03 mg/dL (H)). Liver Function Tests: Recent Labs  Lab 08/28/21 0101 08/28/21 0145  AST 259* 237*  ALT 358* 340*  ALKPHOS 133* 124  BILITOT 7.1* 6.5*  PROT 6.4* 6.1*  ALBUMIN 3.7 3.5   Recent Labs  Lab 08/28/21 0351  LIPASE 25   No results for input(s): AMMONIA in the last 168 hours. Coagulation Profile: Recent Labs  Lab 08/28/21 0632  INR 1.2   Cardiac Enzymes: No results for input(s): CKTOTAL, CKMB, CKMBINDEX, TROPONINI in the last 168 hours. BNP (last 3 results) No results for input(s): PROBNP in the last 8760 hours. HbA1C: No results for input(s): HGBA1C in the last 72 hours. CBG: Recent Labs  Lab 08/28/21 0017  GLUCAP 105*   Lipid Profile: No results for input(s): CHOL, HDL, LDLCALC, TRIG, CHOLHDL, LDLDIRECT in the last 72 hours. Thyroid Function Tests: Recent Labs    08/28/21 0351  TSH 0.676   Anemia Panel: No results for input(s): VITAMINB12, FOLATE, FERRITIN, TIBC, IRON, RETICCTPCT in the last 72 hours. Sepsis Labs: Recent Labs   Lab 08/28/21 0351  PROCALCITON 5.73  LATICACIDVEN 1.0    Recent Results (from the past 240 hour(s))  Resp Panel by RT-PCR (Flu A&B, Covid) Nasopharyngeal Swab     Status: None   Collection Time: 08/28/21 12:20 AM   Specimen: Nasopharyngeal Swab; Nasopharyngeal(NP) swabs in vial transport medium  Result Value Ref Range Status   SARS Coronavirus 2 by RT PCR NEGATIVE NEGATIVE Final    Comment: (NOTE) SARS-CoV-2 target nucleic acids are NOT DETECTED.  The SARS-CoV-2 RNA is generally detectable in upper respiratory specimens during the acute phase of infection. The lowest concentration of SARS-CoV-2 viral copies this assay can detect is 138 copies/mL. A negative result does not preclude SARS-Cov-2 infection and should not be used as the sole basis for treatment or other patient management decisions. A negative result may occur with  improper specimen collection/handling, submission of specimen other than nasopharyngeal swab, presence of viral mutation(s) within the areas targeted by this assay, and inadequate number of viral copies(<138 copies/mL). A negative result must be combined with clinical observations, patient history, and epidemiological information. The expected result is Negative.  Fact Sheet for Patients:  EntrepreneurPulse.com.au  Fact Sheet for Healthcare Providers:  IncredibleEmployment.be  This test is no t yet approved or cleared by the [place] FDA and  has been authorized for detection and/or  diagnosis of SARS-CoV-2 by FDA under an Emergency Use Authorization (EUA). This EUA will remain  in effect (meaning this test can be used) for the duration of the COVID-19 declaration under Section 564(b)(1) of the Act, 21 U.S.C.section 360bbb-3(b)(1), unless the authorization is terminated  or revoked sooner.       Influenza A by PCR NEGATIVE NEGATIVE Final   Influenza B by PCR NEGATIVE NEGATIVE Final    Comment: (NOTE) The  Xpert Xpress SARS-CoV-2/FLU/RSV plus assay is intended as an aid in the diagnosis of influenza from Nasopharyngeal swab specimens and should not be used as a sole basis for treatment. Nasal washings and aspirates are unacceptable for Xpert Xpress SARS-CoV-2/FLU/RSV testing.  Fact Sheet for Patients: EntrepreneurPulse.com.au  Fact Sheet for Healthcare Providers: IncredibleEmployment.be  This test is not yet approved or cleared by the [place] FDA and has been authorized for detection and/or diagnosis of SARS-CoV-2 by FDA under an Emergency Use Authorization (EUA). This EUA will remain in effect (meaning this test can be used) for the duration of the COVID-19 declaration under Section 564(b)(1) of the Act, 21 U.S.C. section 360bbb-3(b)(1), unless the authorization is terminated or revoked.  Performed at [facility], [address] [place], [place] [address]          Radiology Studies: CT HEAD WO CONTRAST (5MM)  Result Date: 08/28/2021 CLINICAL DATA:  Altered mental status, possible UTI EXAM: CT HEAD WITHOUT CONTRAST TECHNIQUE: Contiguous axial images were obtained from the base of the skull through the vertex without intravenous contrast. COMPARISON:  None. FINDINGS: Brain: No evidence of acute infarction, hemorrhage, hydrocephalus, extra-axial collection or mass lesion/mass effect. Mild atrophic changes are noted commensurate with the patient's given age. Vascular: No hyperdense vessel or unexpected calcification. Skull: Normal. Negative for fracture or focal lesion. Sinuses/Orbits: No acute finding. Other: None. IMPRESSION: Mild atrophic changes without acute abnormality. Electronically Signed   By: Inez Catalina M.D.   On: 08/28/2021 00:15   DG Chest Portable 1 View  Result Date: 08/28/2021 CLINICAL DATA:  Altered mental status EXAM: PORTABLE CHEST 1 VIEW COMPARISON:  None. FINDINGS: Cardiac shadow is mildly prominent.  Postsurgical changes are noted. Mild vascular congestion is seen. No edema is noted. No focal infiltrate is noted. No bony abnormality is seen. IMPRESSION: Mild vascular congestion without edema. Electronically Signed   By: Inez Catalina M.D.   On: 08/28/2021 00:32   CT CHEST ABDOMEN PELVIS WO CONTRAST  Result Date: 08/28/2021 CLINICAL DATA:  Chronic dyspnea EXAM: CT CHEST, ABDOMEN AND PELVIS WITHOUT CONTRAST TECHNIQUE: Multidetector CT imaging of the chest, abdomen and pelvis was performed following the standard protocol without IV contrast. COMPARISON:  None. FINDINGS: CT CHEST FINDINGS Cardiovascular: Coronary artery bypass grafting has been performed. Cardiac size within normal limits. No pericardial effusion. Central pulmonary arteries are enlarged in keeping with changes of pulmonary arterial hypertension. Moderate atherosclerotic calcification within the thoracic aorta. No aortic aneurysm. Mediastinum/Nodes: No enlarged mediastinal, hilar, or axillary lymph nodes. Thyroid gland, trachea, and esophagus demonstrate no significant findings. Lungs/Pleura: Moderate emphysema. Right basilar pleural thickening noted posteriorly with associated small focus of rounded atelectasis within the posterior basal right lower lobe. Two 4 mm noncalcified pulmonary nodules are seen within the subpleural right middle lobe, axial image # [id number], series 4, indeterminate. No focal pulmonary infiltrate. No pneumothorax or pleural effusion. Musculoskeletal: No chest wall mass or suspicious bone lesions identified. CT ABDOMEN PELVIS FINDINGS Hepatobiliary: Cholelithiasis without pericholecystic inflammatory change. Liver unremarkable. No intra or extrahepatic biliary ductal dilation.  Pancreas: Unremarkable Spleen: Mild splenomegaly is present with the spleen measuring 15.9 cm in greatest dimension. No intrasplenic lesions are seen. Adrenals/Urinary Tract: Adrenal glands are unremarkable. Kidneys are normal, without renal calculi,  focal lesion, or hydronephrosis. Bladder is unremarkable. Stomach/Bowel: Moderate to severe descending and sigmoid colonic diverticulosis without superimposed acute inflammatory change. Stomach, small bowel, and large bowel are otherwise unremarkable. Appendix normal. No free intraperitoneal gas or fluid. Vascular/Lymphatic: Moderate aortoiliac atherosclerotic calcification. No aortic aneurysm. No pathologic adenopathy within the abdomen and pelvis. Reproductive: Prostate is unremarkable. Other: Bilateral inguinal hernia repair with mesh has been performed. No recurrent abdominal wall hernia. Musculoskeletal: No acute bone abnormality. No lytic or blastic bone lesion. IMPRESSION: No acute intrathoracic or intra-abdominal pathology identified. Moderate emphysema. Multiple pulmonary nodules. Most severe: 4 mm right solid pulmonary nodule. No routine follow-up imaging is recommended per Fleischner Society Guidelines. These guidelines do not apply to immunocompromised patients and patients with cancer. Follow up in patients with significant comorbidities as clinically warranted. For lung cancer screening, adhere to Lung-RADS guidelines. Reference: Radiology. 2017; 284(1):228-43. Morphologic changes in keeping with pulmonary arterial hypertension. Cholelithiasis. Mild splenomegaly. Moderate to severe distal colonic diverticulosis. No superimposed acute inflammatory change. Aortic Atherosclerosis (ICD10-I70.0) and Emphysema (ICD10-J43.9). Electronically Signed   By: Fidela Salisbury M.D.   On: 08/28/2021 03:19   US ABDOMEN LIMITED RUQ (LIVER/GB)  Result Date: 08/28/2021 CLINICAL DATA:  Elevated liver enzymes EXAM: ULTRASOUND ABDOMEN LIMITED RIGHT UPPER QUADRANT COMPARISON:  08/28/2021 FINDINGS: Gallbladder: Cholelithiasis. No gallbladder wall thickening or pericholecystic fluid. Largest gallstone measures 8.5 mm. Negative sonographic Murphy sign. Common bile duct: Diameter: 6 mm Liver: No focal lesion identified.  Within normal limits in parenchymal echogenicity. Portal vein is patent on color Doppler imaging with normal direction of blood flow towards the liver. Other: None. IMPRESSION: Cholelithiasis without sonographic evidence of acute cholecystitis. Electronically Signed   By: Kathreen Devoid M.D.   On: 08/28/2021 07:37        Scheduled Meds: Continuous Infusions:  ceFEPime (MAXIPIME) IV     heparin 950 Units/hr (08/28/21 0823)   lactated ringers 75 mL/hr at 08/28/21 0825   metronidazole     [START ON 08/30/2021] vancomycin       LOS: 0 days    Time spent: 49 minutes spent on chart review, discussion with nursing staff, consultants, updating family and interview/physical exam; more than 50% of that time was spent in counseling and/or coordination of care.    Kennita Pavlovich J [place] DO [facility] Available via Epic secure chat 7am-7pm After these hours, please refer to coverage provider listed on [email or web address] 08/28/2021, 10:24 AM

## 2021-08-28 NOTE — Progress Notes (Signed)
Pharmacy Antibiotic Note  Clifford Martin is a 80 y.o. male admitted on 08/27/2021 with sepsis.  Pharmacy has been consulted for Cefepime, Vancomycin dosing.  Plan: Cefepime 2 gm IV X 1 given on 12/23 @ 0506. Cefepime 2 gm IV Q12H ordered to continue on 12/23 @ 1700.   Vancomycin 1750 mg IV X 1 ordered for 12/23 @ ~ 0700. Vancomycin 1750 mg IV Q48H ordered to continue on 12/25 @ 0700.  AUC = 508.5 Vanc trough = 10 mcg/mL   Height: 5\' 10"  (177.8 cm) Weight: 79.4 kg (175 lb) IBW/kg (Calculated) : 73  Temp (24hrs), Avg:99.4 F (37.4 C), Min:98.6 F (37 C), Max:100.1 F (37.8 C)  Recent Labs  Lab 08/27/21 2355 08/28/21 0101 08/28/21 0145  WBC 7.6  --   --   CREATININE  --  2.12* 2.03*    Estimated Creatinine Clearance: 31 mL/min (A) (by C-G formula based on SCr of 2.03 mg/dL (H)).    No Known Allergies  Antimicrobials this admission:   >>    >>   Dose adjustments this admission:   Microbiology results:  BCx:   UCx:    Sputum:    MRSA PCR:   Thank you for allowing pharmacy to be a part of this patients care.  Clifford Martin D 08/28/2021 5:31 AM

## 2021-08-28 NOTE — Progress Notes (Signed)
ANTICOAGULATION CONSULT NOTE - Initial Consult  Pharmacy Consult for Heparin  Indication: chest pain/ACS  No Known Allergies  Patient Measurements: Height: 5\' 10"  (177.8 cm) Weight: 79.4 kg (175 lb) IBW/kg (Calculated) : 73 Heparin Dosing Weight: 79.4 kg   Vital Signs: Temp: 100.1 F (37.8 C) (12/23 0357) Temp Source: Rectal (12/23 0357) BP: 119/46 (12/23 0530) Pulse Rate: 101 (12/23 0530)  Labs: Recent Labs    08/27/21 2355 08/28/21 0101 08/28/21 0145 08/28/21 0419 08/28/21 0522  HGB 12.3*  --   --   --  12.3*  HCT 36.7*  --   --   --  35.9*  PLT 119*  --   --   --  120*  CREATININE  --  2.12* 2.03*  --   --   TROPONINIHS 280*  --   --  508*  --     Estimated Creatinine Clearance: 31 mL/min (A) (by C-G formula based on SCr of 2.03 mg/dL (H)).   Medical History: Past Medical History:  Diagnosis Date   CHF (congestive heart failure) ([place])    Diabetes mellitus without complication (HCC)    Hyperlipidemia    Hypertension     Medications:  (Not in a hospital admission)   Assessment: Pharmacy consulted to dose heparin in this 80 year old male admitted with ACS/NSTEMI.  No prior anticoag noted. CrCl = 31 ml/min   Goal of Therapy:  Heparin level 0.3-0.7 units/ml Monitor platelets by anticoagulation protocol: Yes   Plan:  Give 4000 units bolus x 1 Start heparin infusion at 950 units/hr Check anti-Xa level in 8 hours and daily while on heparin Continue to monitor H&H and platelets  Loranzo Desha D 08/28/2021,6:12 AM

## 2021-08-28 NOTE — ED Notes (Addendum)
Pt is addiment about leaving. Pts' pulling at IV site trying to remove. Pt states he is exhausted. NT suggested pt to relax and close eyes. Pt tapping feet on floor and states he need to contact family to take him somewhere.

## 2021-08-28 NOTE — ED Notes (Signed)
Pt states he wants to leave this place. Pt states we are holding him hostage.

## 2021-08-28 NOTE — Progress Notes (Signed)
CODE SEPSIS - PHARMACY COMMUNICATION  **Broad Spectrum Antibiotics should be administered within 1 hour of Sepsis diagnosis**  Time Code Sepsis Called/Page Received: 12/23 @ 0424  Antibiotics Ordered: Cefepime, Vanc, metronidazole  Time of 1st antibiotic administration: Cefepime 2 gm IV X 1 on 12/23 @ 0506  Additional action taken by pharmacy:   If necessary, Name of Provider/Nurse Contacted:     Cheronda Erck D ,PharmD Clinical Pharmacist  08/28/2021  5:25 AM

## 2021-08-28 NOTE — Sepsis Progress Note (Signed)
[facility]link following Code Sepsis. 

## 2021-08-28 NOTE — H&P (Signed)
History and Physical    Clifford Martin OFB:[id number] DOB: 11/21/1940 DOA: 08/27/2021  PCP: [facility], [place]   Patient coming from: home  I have personally briefly reviewed patient's relevant medical records in [facility]  Chief Complaint: confusion, vomiting  HPI: Clifford Martin is a 80 y.o. male with medical history significant for HTN, DM,  HFrEF, CAD s/p PCI and CABG who presents to the ED with generalized malaise upon awakening associated with an episode of vomiting and later on he was noted to be confused, trying to drink out of his phone and stating he was going to remove the kitchen countertops.  He denied abdominal pain or diarrhea.  Denied cough .fever or chills.  Patient notes that he has chronic shortness of breath related to his CHF and noted that he has had increased dyspnea on exertion over the past several months.  He denies chest pain EMS found him to have O2 sats in the 80s on room air and he was placed on 3 L  ED course: Hypotensive to 99/50 with pulse of 91, T-max 100.1, O2 sat 100% on 3 L.  Respiratory rate 22-27 Blood work with multiple abnormalities WBC normal, lactic acid pending Hemoglobin 12.3 with platelets 119 Creatinine 2.12, AST/ALT 259/358 with alk phos of 133 and total bili 7.1 Troponin 280 and BNP 459 D-dimer 1.38 COVID and flu negative  EKG, personally viewed and interpreted: NSR at 87 with nonspecific ST-T wave changes  Imaging: CT chest abdomen pelvis cholelithiasis with mild splenomegaly, multiple pulmonary nodules, no acute intrathoracic or intra-abdominal pathology V/Q ordered: Pending  Patient was started on broad-spectrum antibiotics for concern for severe sepsis with multiorgan failure as well as IV fluid bolus.  Hospitalist consulted for admission.   Review of Systems: As per HPI otherwise all other systems on review of systems negative.   Assessment/Plan    Acute metabolic encephalopathy -Etiology uncertain but  working diagnosis of severe sepsis - Neurologic checks with fall and aspiration precautions - Treat acute sepsis  Possible severe sepsis, suspecting intra-abdominal/biliary source Cholelithiasis on CT Abnormal LFTs with  hyperbilirubinemia Painless/obstructive jaundice - Cefepime Flagyl and vancomycin given uncertain source - Follow-up RUQ sonogram and MRCP if negative - IV sepsis fluids - Consider GI evaluation    AKI (acute kidney injury) ([place]) - IV hydration and monitor renal function    Pulmonary nodules - Surveillance as recommended per radiology  Elevated troponin, possible NSTEMI HFrEF CAD s/p CABG - Denies chest pain and EKG nonacute - Troponin 280 and BNP 459 but chest x-ray nonacute and does not appear fluid overloaded - Consider cardiology consult in the a.m. - Daily weights  Hypotension with history of hypertension - Hold home antihypertensives    Diabetes mellitus without complication (HCC) - Sliding scale insulin coverage    Acute respiratory failure with hypoxia ([place]) - Follow-up V/Q to evaluate for acute PE -Supplemental O2 to keep sats over 92%   DVT prophylaxis: Lovenox  Code Status: full code  Family Communication:  none  Disposition Plan: Back to previous home environment Consults called: none  Status:At the time of admission, it appears that the appropriate admission status for this patient is INPATIENT. This is judged to be reasonable and necessary in order to provide the required intensity of service to ensure the patient's safety given the presenting symptoms, physical exam findings, and initial radiographic and laboratory data in the context of their  Comorbid conditions.   Patient requires inpatient status due to  high intensity of service, high risk for further deterioration and high frequency of surveillance required.   I certify that at the point of admission it is my clinical judgment that the patient will require inpatient hospital care  spanning beyond 2 midnights     Physical Exam: Vitals:   08/28/21 0245 08/28/21 0302 08/28/21 0315 08/28/21 0357  BP:      Pulse: 90  88   Resp: 18     Temp:    100.1 F (37.8 C)  TempSrc:    Rectal  SpO2: 95%  97%   Weight:  79.4 kg    Height:  '5\' 10"'  (1.778 m)     Constitutional: Alert, oriented x 3 . Not in any apparent distres HEENT:      Head: Normocephalic and atraumatic.         Eyes: PERLA, EOMI, Conjunctivae are normal. Sclera is icteric.       Mouth/Throat: Mucous membranes are moist.       Neck: Supple with no signs of meningismus. Cardiovascular: Regular rate and rhythm. No murmurs, gallops, or rubs. 2+ symmetrical distal pulses are present . No JVD. No  LE edema Respiratory: Respiratory effort normal .Lungs sounds clear bilaterally. No wheezes, crackles, or rhonchi.  Gastrointestinal: Soft, non tender, non distended. Positive bowel sounds.  Genitourinary: No CVA tenderness. Musculoskeletal: Nontender with normal range of motion in all extremities. No cyanosis, or erythema of extremities. Neurologic:  Face is symmetric. Moving all extremities. No gross focal neurologic deficits . Skin: Skin is warm, dry.  No rash or ulcers Psychiatric: Mood and affect are appropriate     Past Medical History:  Diagnosis Date   CHF (congestive heart failure) ([place])    Diabetes mellitus without complication ([place])    Hyperlipidemia    Hypertension     History reviewed. No pertinent surgical history.  History of CABG   has no history on file for tobacco use, alcohol use, and drug use.  No tobacco or alcohol use  No Known Allergies  History reviewed. No pertinent family history.    Prior to Admission medications   Not on File      Labs on Admission: I have personally reviewed following labs and imaging studies  CBC: Recent Labs  Lab 08/27/21 2355  WBC 7.6  NEUTROABS 6.5  HGB 12.3*  HCT 36.7*  MCV 92.7  PLT 191*   Basic Metabolic Panel: Recent Labs  Lab  08/28/21 0101 08/28/21 0145  NA 136 137  K 3.8 4.1  CL 101 102  CO2 27 28  GLUCOSE 117* 113*  BUN 29* 27*  CREATININE 2.12* 2.03*  CALCIUM 8.4* 8.4*   GFR: Estimated Creatinine Clearance: 31 mL/min (A) (by C-G formula based on SCr of 2.03 mg/dL (H)). Liver Function Tests: Recent Labs  Lab 08/28/21 0101 08/28/21 0145  AST 259* 237*  ALT 358* 340*  ALKPHOS 133* 124  BILITOT 7.1* 6.5*  PROT 6.4* 6.1*  ALBUMIN 3.7 3.5   No results for input(s): LIPASE, AMYLASE in the last 168 hours. No results for input(s): AMMONIA in the last 168 hours. Coagulation Profile: No results for input(s): INR, PROTIME in the last 168 hours. Cardiac Enzymes: No results for input(s): CKTOTAL, CKMB, CKMBINDEX, TROPONINI in the last 168 hours. BNP (last 3 results) No results for input(s): PROBNP in the last 8760 hours. HbA1C: No results for input(s): HGBA1C in the last 72 hours. CBG: Recent Labs  Lab 08/28/21 0017  GLUCAP 105*   Lipid Profile:  No results for input(s): CHOL, HDL, LDLCALC, TRIG, CHOLHDL, LDLDIRECT in the last 72 hours. Thyroid Function Tests: No results for input(s): TSH, T4TOTAL, FREET4, T3FREE, THYROIDAB in the last 72 hours. Anemia Panel: No results for input(s): VITAMINB12, FOLATE, FERRITIN, TIBC, IRON, RETICCTPCT in the last 72 hours. Urine analysis:    Component Value Date/Time   COLORURINE AMBER (A) 08/28/2021 0020   APPEARANCEUR HAZY (A) 08/28/2021 0020   LABSPEC 1.020 08/28/2021 0020   PHURINE 5.5 08/28/2021 0020   GLUCOSEU 100 (A) 08/28/2021 0020   HGBUR NEGATIVE 08/28/2021 0020   BILIRUBINUR LARGE (A) 08/28/2021 0020   KETONESUR 15 (A) 08/28/2021 0020   PROTEINUR 100 (A) 08/28/2021 0020   NITRITE NEGATIVE 08/28/2021 0020   LEUKOCYTESUR NEGATIVE 08/28/2021 0020    Radiological Exams on Admission: CT HEAD WO CONTRAST (5MM)  Result Date: 08/28/2021 CLINICAL DATA:  Altered mental status, possible UTI EXAM: CT HEAD WITHOUT CONTRAST TECHNIQUE: Contiguous axial  images were obtained from the base of the skull through the vertex without intravenous contrast. COMPARISON:  None. FINDINGS: Brain: No evidence of acute infarction, hemorrhage, hydrocephalus, extra-axial collection or mass lesion/mass effect. Mild atrophic changes are noted commensurate with the patient's given age. Vascular: No hyperdense vessel or unexpected calcification. Skull: Normal. Negative for fracture or focal lesion. Sinuses/Orbits: No acute finding. Other: None. IMPRESSION: Mild atrophic changes without acute abnormality. Electronically Signed   By: Inez Catalina M.D.   On: 08/28/2021 00:15   DG Chest Portable 1 View  Result Date: 08/28/2021 CLINICAL DATA:  Altered mental status EXAM: PORTABLE CHEST 1 VIEW COMPARISON:  None. FINDINGS: Cardiac shadow is mildly prominent. Postsurgical changes are noted. Mild vascular congestion is seen. No edema is noted. No focal infiltrate is noted. No bony abnormality is seen. IMPRESSION: Mild vascular congestion without edema. Electronically Signed   By: Inez Catalina M.D.   On: 08/28/2021 00:32   CT CHEST ABDOMEN PELVIS WO CONTRAST  Result Date: 08/28/2021 CLINICAL DATA:  Chronic dyspnea EXAM: CT CHEST, ABDOMEN AND PELVIS WITHOUT CONTRAST TECHNIQUE: Multidetector CT imaging of the chest, abdomen and pelvis was performed following the standard protocol without IV contrast. COMPARISON:  None. FINDINGS: CT CHEST FINDINGS Cardiovascular: Coronary artery bypass grafting has been performed. Cardiac size within normal limits. No pericardial effusion. Central pulmonary arteries are enlarged in keeping with changes of pulmonary arterial hypertension. Moderate atherosclerotic calcification within the thoracic aorta. No aortic aneurysm. Mediastinum/Nodes: No enlarged mediastinal, hilar, or axillary lymph nodes. Thyroid gland, trachea, and esophagus demonstrate no significant findings. Lungs/Pleura: Moderate emphysema. Right basilar pleural thickening noted posteriorly  with associated small focus of rounded atelectasis within the posterior basal right lower lobe. Two 4 mm noncalcified pulmonary nodules are seen within the subpleural right middle lobe, axial image # [id number], series 4, indeterminate. No focal pulmonary infiltrate. No pneumothorax or pleural effusion. Musculoskeletal: No chest wall mass or suspicious bone lesions identified. CT ABDOMEN PELVIS FINDINGS Hepatobiliary: Cholelithiasis without pericholecystic inflammatory change. Liver unremarkable. No intra or extrahepatic biliary ductal dilation. Pancreas: Unremarkable Spleen: Mild splenomegaly is present with the spleen measuring 15.9 cm in greatest dimension. No intrasplenic lesions are seen. Adrenals/Urinary Tract: Adrenal glands are unremarkable. Kidneys are normal, without renal calculi, focal lesion, or hydronephrosis. Bladder is unremarkable. Stomach/Bowel: Moderate to severe descending and sigmoid colonic diverticulosis without superimposed acute inflammatory change. Stomach, small bowel, and large bowel are otherwise unremarkable. Appendix normal. No free intraperitoneal gas or fluid. Vascular/Lymphatic: Moderate aortoiliac atherosclerotic calcification. No aortic aneurysm. No pathologic adenopathy within the abdomen and pelvis.  Reproductive: Prostate is unremarkable. Other: Bilateral inguinal hernia repair with mesh has been performed. No recurrent abdominal wall hernia. Musculoskeletal: No acute bone abnormality. No lytic or blastic bone lesion. IMPRESSION: No acute intrathoracic or intra-abdominal pathology identified. Moderate emphysema. Multiple pulmonary nodules. Most severe: 4 mm right solid pulmonary nodule. No routine follow-up imaging is recommended per Fleischner Society Guidelines. These guidelines do not apply to immunocompromised patients and patients with cancer. Follow up in patients with significant comorbidities as clinically warranted. For lung cancer screening, adhere to Lung-RADS guidelines.  Reference: Radiology. 2017; 284(1):228-43. Morphologic changes in keeping with pulmonary arterial hypertension. Cholelithiasis. Mild splenomegaly. Moderate to severe distal colonic diverticulosis. No superimposed acute inflammatory change. Aortic Atherosclerosis (ICD10-I70.0) and Emphysema (ICD10-J43.9). Electronically Signed   By: Fidela Salisbury M.D.   On: 08/28/2021 03:19       Athena Masse MD [facility]   08/28/2021, 5:10 AM

## 2021-08-28 NOTE — ED Notes (Signed)
Pt wife sitting at bedside, pt is currently off unit having VQ scan done

## 2021-08-28 NOTE — ED Notes (Signed)
Vancomycin ordered from pharmacy  °

## 2021-08-28 NOTE — ED Notes (Signed)
X-ray at bedside

## 2021-08-28 NOTE — ED Notes (Signed)
Pharmacy, LR and Flagyl are compatible. Will be run together
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
## 2021-08-28 NOTE — ED Notes (Signed)
Pt updated on need for urine sample. Pt denies any need to go at this time. Will let me know when they are able to do so. Urinal provided at bedside

## 2021-08-28 NOTE — ED Notes (Signed)
Dr. [place] sent secure chat regarding pts change in mental status after receiving Ativan for MRI. Pt is now climbing out of bed and not making sense when he is talking. Pt pulling at IV line. Aldona Bar, charge RN informed of need for Air cabin crew.

## 2021-08-29 ENCOUNTER — Inpatient Hospital Stay: Payer: PPO

## 2021-08-29 ENCOUNTER — Encounter: Payer: Self-pay | Admitting: Internal Medicine

## 2021-08-29 DIAGNOSIS — A419 Sepsis, unspecified organism: Secondary | ICD-10-CM | POA: Diagnosis present

## 2021-08-29 DIAGNOSIS — R0602 Shortness of breath: Secondary | ICD-10-CM | POA: Diagnosis not present

## 2021-08-29 DIAGNOSIS — S0990XA Unspecified injury of head, initial encounter: Secondary | ICD-10-CM | POA: Diagnosis not present

## 2021-08-29 DIAGNOSIS — I517 Cardiomegaly: Secondary | ICD-10-CM | POA: Diagnosis not present

## 2021-08-29 DIAGNOSIS — R4182 Altered mental status, unspecified: Secondary | ICD-10-CM | POA: Diagnosis not present

## 2021-08-29 DIAGNOSIS — J3489 Other specified disorders of nose and nasal sinuses: Secondary | ICD-10-CM | POA: Diagnosis not present

## 2021-08-29 LAB — BLOOD CULTURE ID PANEL (REFLEXED) - BCID2

## 2021-08-29 LAB — HEPATIC FUNCTION PANEL
ALT: 190 U/L — ABNORMAL HIGH (ref 0–44)
AST: 88 U/L — ABNORMAL HIGH (ref 15–41)
Albumin: 3.4 g/dL — ABNORMAL LOW (ref 3.5–5.0)
Alkaline Phosphatase: 109 U/L (ref 38–126)
Bilirubin, Direct: 1.8 mg/dL — ABNORMAL HIGH (ref 0.0–0.2)
Indirect Bilirubin: 2 mg/dL — ABNORMAL HIGH (ref 0.3–0.9)
Total Bilirubin: 3.8 mg/dL — ABNORMAL HIGH (ref 0.3–1.2)
Total Protein: 6.3 g/dL — ABNORMAL LOW (ref 6.5–8.1)

## 2021-08-29 LAB — CBC
HCT: 36.5 % — ABNORMAL LOW (ref 39.0–52.0)
HCT: 37.9 % — ABNORMAL LOW (ref 39.0–52.0)
Hemoglobin: 12.7 g/dL — ABNORMAL LOW (ref 13.0–17.0)
Hemoglobin: 13 g/dL (ref 13.0–17.0)
MCH: 30.9 pg (ref 26.0–34.0)
MCH: 30.2 pg (ref 26.0–34.0)
MCHC: 34.8 g/dL (ref 30.0–36.0)
MCHC: 34.3 g/dL (ref 30.0–36.0)
MCV: 88.8 fL (ref 80.0–100.0)
MCV: 88.1 fL (ref 80.0–100.0)
Platelets: 138 10*3/uL — ABNORMAL LOW (ref 150–400)
Platelets: 137 10*3/uL — ABNORMAL LOW (ref 150–400)
RBC: 4.11 MIL/uL — ABNORMAL LOW (ref 4.22–5.81)
RBC: 4.3 MIL/uL (ref 4.22–5.81)
RDW: 13.6 % (ref 11.5–15.5)
RDW: 13.6 % (ref 11.5–15.5)
WBC: 5.8 10*3/uL (ref 4.0–10.5)
WBC: 5.1 10*3/uL (ref 4.0–10.5)
nRBC: 0 % (ref 0.0–0.2)
nRBC: 0 % (ref 0.0–0.2)

## 2021-08-29 LAB — GLUCOSE, CAPILLARY
Glucose-Capillary: 120 mg/dL — ABNORMAL HIGH (ref 70–99)
Glucose-Capillary: 127 mg/dL — ABNORMAL HIGH (ref 70–99)
Glucose-Capillary: 110 mg/dL — ABNORMAL HIGH (ref 70–99)

## 2021-08-29 LAB — BASIC METABOLIC PANEL
Anion gap: 7 (ref 5–15)
BUN: 21 mg/dL (ref 8–23)
CO2: 28 mmol/L (ref 22–32)
Calcium: 8.6 mg/dL — ABNORMAL LOW (ref 8.9–10.3)
Chloride: 105 mmol/L (ref 98–111)
Creatinine, Ser: 0.99 mg/dL (ref 0.61–1.24)
GFR, Estimated: >60 mL/min (ref >60)
Glucose, Bld: 131 mg/dL — ABNORMAL HIGH (ref 70–99)
Potassium: 3.6 mmol/L (ref 3.5–5.1)
Sodium: 140 mmol/L (ref 135–145)

## 2021-08-29 LAB — URINE CULTURE: Culture: <10000 — AB

## 2021-08-29 LAB — CREATININE, SERUM
Creatinine, Ser: 0.91 mg/dL (ref 0.61–1.24)
GFR, Estimated: >60 mL/min (ref >60)

## 2021-08-29 LAB — PROTIME-INR
INR: 1.1 (ref 0.8–1.2)
Prothrombin Time: 13.9 seconds (ref 11.4–15.2)

## 2021-08-29 LAB — MRSA NEXT GEN BY PCR, NASAL: MRSA by PCR Next Gen: NOT DETECTED

## 2021-08-29 LAB — PROCALCITONIN: Procalcitonin: 1.82 ng/mL

## 2021-08-29 LAB — CORTISOL-AM, BLOOD: Cortisol - AM: 25.8 ug/dL — ABNORMAL HIGH (ref 6.7–22.6)

## 2021-08-29 MED ORDER — SODIUM CHLORIDE 0.9 % IV SOLN
2.0000 g | Freq: Three times a day (TID) | INTRAVENOUS | Status: DC
  Filled 2021-08-29 (×2): qty 2

## 2021-08-29 MED ORDER — METOPROLOL TARTRATE 5 MG/5ML IV SOLN
5.0000 mg | Freq: Once | INTRAVENOUS | Status: DC
Start: 2021-08-29 — End: 2021-08-29

## 2021-08-29 MED ORDER — HALOPERIDOL LACTATE 5 MG/ML IJ SOLN
2.0000 mg | Freq: Once | INTRAMUSCULAR | Status: AC
  Administered 2021-08-29: 01:00:00 2 mg via INTRAVENOUS

## 2021-08-29 MED ORDER — ZIPRASIDONE MESYLATE 20 MG IM SOLR
10.0000 mg | INTRAMUSCULAR | Status: DC | PRN
  Administered 2021-08-29 – 2021-08-30 (×6): 10 mg via INTRAMUSCULAR
  Filled 2021-08-29 (×8): qty 20

## 2021-08-29 MED ORDER — LORAZEPAM 2 MG/ML IJ SOLN
2.0000 mg | Freq: Once | INTRAMUSCULAR | Status: AC
  Administered 2021-08-29: 02:00:00 2 mg via INTRAVENOUS
  Filled 2021-08-29: qty 1

## 2021-08-29 MED ORDER — METOPROLOL TARTRATE 5 MG/5ML IV SOLN
5.0000 mg | Freq: Four times a day (QID) | INTRAVENOUS | Status: DC | PRN
  Administered 2021-08-30: 09:00:00 5 mg via INTRAVENOUS
  Filled 2021-08-29: qty 5

## 2021-08-29 MED ORDER — GUAIFENESIN ER 600 MG PO TB12
1200.0000 mg | ORAL_TABLET | Freq: Two times a day (BID) | ORAL | Status: DC
  Administered 2021-08-29 – 2021-09-01 (×7): 1200 mg via ORAL
  Filled 2021-08-29 (×7): qty 2

## 2021-08-29 MED ORDER — LORAZEPAM 2 MG/ML IJ SOLN
2.0000 mg | Freq: Once | INTRAMUSCULAR | Status: AC
  Administered 2021-08-29: 06:00:00 2 mg via INTRAVENOUS
  Filled 2021-08-29: qty 1

## 2021-08-29 MED ORDER — VANCOMYCIN HCL 1750 MG/350ML IV SOLN
1750.0000 mg | INTRAVENOUS | Status: DC
  Filled 2021-08-29: qty 350

## 2021-08-29 MED ORDER — IPRATROPIUM-ALBUTEROL 0.5-2.5 (3) MG/3ML IN SOLN
3.0000 mL | Freq: Four times a day (QID) | RESPIRATORY_TRACT | Status: DC
  Administered 2021-08-29 – 2021-08-30 (×3): 3 mL via RESPIRATORY_TRACT
  Filled 2021-08-29 (×3): qty 3

## 2021-08-29 MED ORDER — SODIUM CHLORIDE 0.9 % IV SOLN
2.0000 g | INTRAVENOUS | Status: DC
  Administered 2021-08-29 – 2021-08-31 (×3): 2 g via INTRAVENOUS
  Filled 2021-08-29: qty 2
  Filled 2021-08-29 (×3): qty 20

## 2021-08-29 MED ORDER — HALOPERIDOL LACTATE 5 MG/ML IJ SOLN
2.0000 mg | Freq: Once | INTRAMUSCULAR | Status: DC
  Filled 2021-08-29: qty 1

## 2021-08-29 MED ORDER — FUROSEMIDE 10 MG/ML IJ SOLN
40.0000 mg | Freq: Once | INTRAMUSCULAR | Status: AC
  Administered 2021-08-29: 15:00:00 40 mg via INTRAVENOUS
  Filled 2021-08-29: qty 4

## 2021-08-29 MED ORDER — ACETYLCYSTEINE 10% NICU INHALATION SOLUTION
4.0000 mL | Freq: Two times a day (BID) | RESPIRATORY_TRACT | Status: DC
  Filled 2021-08-29 (×4): qty 4

## 2021-08-29 MED ORDER — ZIPRASIDONE MESYLATE 20 MG IM SOLR
10.0000 mg | Freq: Once | INTRAMUSCULAR | Status: AC
  Administered 2021-08-29: 22:00:00 10 mg via INTRAMUSCULAR
  Filled 2021-08-29: qty 20

## 2021-08-29 MED ORDER — LACTATED RINGERS IV SOLN: INTRAVENOUS | Status: DC

## 2021-08-29 MED ORDER — ZIPRASIDONE MESYLATE 20 MG IM SOLR
20.0000 mg | Freq: Once | INTRAMUSCULAR | Status: AC
  Administered 2021-08-29: 01:00:00 20 mg via INTRAMUSCULAR
  Filled 2021-08-29: qty 20

## 2021-08-29 MED ORDER — ENOXAPARIN SODIUM 40 MG/0.4ML IJ SOSY
40.0000 mg | PREFILLED_SYRINGE | INTRAMUSCULAR | Status: DC
  Administered 2021-08-29 – 2021-08-31 (×3): 40 mg via SUBCUTANEOUS
  Filled 2021-08-29 (×4): qty 0.4

## 2021-08-29 NOTE — Progress Notes (Signed)
Patient started on Lasix and mucinex. Denies shortness of breath. Sats 98% on 3 liters O2.

## 2021-08-29 NOTE — Progress Notes (Signed)
Patient climbed out of bed over side rail with sitter at bedside. Walked out of room to the nursing station. Staff assisted to get patient back in room. PRN medication given.

## 2021-08-29 NOTE — ED Notes (Signed)
RN to bedside. Pt is resting. Sitter at bedside.

## 2021-08-29 NOTE — Progress Notes (Signed)
PHARMACY - PHYSICIAN COMMUNICATION CRITICAL VALUE ALERT - BLOOD CULTURE IDENTIFICATION (BCID)  Clifford Martin is an 80 y.o. male who presented to [facility] on 08/27/2021 with a chief complaint of sepsis of unknown source  Assessment:  per BCID, recommended de-escalation of ABX to ceftriaxone 2gm daily.  Name of physician (or Provider) Contacted: Priscella Mann  Current antibiotics: Cefepime, vanc, flagyl  Changes to prescribed antibiotics recommended:  Provider accepted de-escalation of cefepime to ceftriaxone, D/C vanc if PCR negative, but continue Flagyl for now.  Will monitor results of PCR for possible D/C of vanc.  Results for orders placed or performed during the hospital encounter of 08/27/21  Blood Culture ID Panel (Reflexed) (Collected: 08/28/2021  3:51 AM)  Result Value Ref Range   Enterococcus faecalis NOT DETECTED NOT DETECTED   Enterococcus Faecium NOT DETECTED NOT DETECTED   Listeria monocytogenes NOT DETECTED NOT DETECTED   Staphylococcus species NOT DETECTED NOT DETECTED   Staphylococcus aureus (BCID) NOT DETECTED NOT DETECTED   Staphylococcus epidermidis NOT DETECTED NOT DETECTED   Staphylococcus lugdunensis NOT DETECTED NOT DETECTED   Streptococcus species NOT DETECTED NOT DETECTED   Streptococcus agalactiae NOT DETECTED NOT DETECTED   Streptococcus pneumoniae NOT DETECTED NOT DETECTED   Streptococcus pyogenes NOT DETECTED NOT DETECTED   A.calcoaceticus-baumannii NOT DETECTED NOT DETECTED   Bacteroides fragilis NOT DETECTED NOT DETECTED   Enterobacterales DETECTED (A) NOT DETECTED   Enterobacter cloacae complex NOT DETECTED NOT DETECTED   Escherichia coli DETECTED (A) NOT DETECTED   Klebsiella aerogenes NOT DETECTED NOT DETECTED   Klebsiella oxytoca NOT DETECTED NOT DETECTED   Klebsiella pneumoniae NOT DETECTED NOT DETECTED   Proteus species NOT DETECTED NOT DETECTED   Salmonella species NOT DETECTED NOT DETECTED   Serratia marcescens NOT DETECTED NOT DETECTED    Haemophilus influenzae NOT DETECTED NOT DETECTED   Neisseria meningitidis NOT DETECTED NOT DETECTED   Pseudomonas aeruginosa NOT DETECTED NOT DETECTED   Stenotrophomonas maltophilia NOT DETECTED NOT DETECTED   Candida albicans NOT DETECTED NOT DETECTED   Candida auris NOT DETECTED NOT DETECTED   Candida glabrata NOT DETECTED NOT DETECTED   Candida krusei NOT DETECTED NOT DETECTED   Candida parapsilosis NOT DETECTED NOT DETECTED   Candida tropicalis NOT DETECTED NOT DETECTED   Cryptococcus neoformans/gattii NOT DETECTED NOT DETECTED   CTX-M ESBL NOT DETECTED NOT DETECTED   Carbapenem resistance IMP NOT DETECTED NOT DETECTED   Carbapenem resistance KPC NOT DETECTED NOT DETECTED   Carbapenem resistance NDM NOT DETECTED NOT DETECTED   Carbapenem resist OXA 48 LIKE NOT DETECTED NOT DETECTED   Carbapenem resistance VIM NOT DETECTED NOT DETECTED    Berta Minor 08/29/2021  10:47 AM

## 2021-08-29 NOTE — Progress Notes (Signed)
°  CROSS COVER Patient  with severe agitation and combativeness . Per report, and chart review, this is not new this admit. Has had to have security at bedside multiple times tonight. Was given multiple haldol doses prior to me being made aware of patient situation.  Geodon followed by ativan was ordered and effective for calming. CT head completed secondary to patient bouncing his head off the paper towel dispenser.  Head CT with chronic microvascular changes and atrophy, no hemorrhage

## 2021-08-29 NOTE — ED Notes (Signed)
Pt has more agitation at this time.

## 2021-08-29 NOTE — ED Notes (Signed)
NP at bedside to evaluate pt. See MAR for new orders.

## 2021-08-29 NOTE — ED Notes (Signed)
Patient is resting comfortably. 

## 2021-08-29 NOTE — Plan of Care (Signed)

## 2021-08-29 NOTE — ED Notes (Addendum)
Pt appears restless asking "why are you doing this to me, I have talked to you and have not done anything to hurt you" Writer tells Pt to try to lay back and relax and tells him we are trying to help him to feel better. Pt lays back and closes eyes.
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
## 2021-08-29 NOTE — TOC CM/SW Note (Signed)
°  Transition of Care (TOC) Screening Note   Patient Details  Name: Clifford Martin Date of Birth: [birth year]   Transition of Care [facility]) CM/SW Contact:    Magnus Ivan, LCSW Phone Number: 08/29/2021, 12:15 PM    Transition of Care Department [facility]) has reviewed patient and no TOC needs have been identified at this time. We will continue to monitor patient advancement through interdisciplinary progression rounds. If new patient transition needs arise, please place a TOC consult.

## 2021-08-29 NOTE — Progress Notes (Signed)
Pharmacy Antibiotic Note  Clifford Martin is a 80 y.o. male admitted on 08/27/2021 with sepsis, unknown source.  Pharmacy has been consulted for Cefepime, Vancomycin dosing.  -Blood cx 1 of 4 bottles (aerobic): GNR, ?BCID -Ucx: Insignificant growth -MRSA PCR pending   Plan: Scr improved 2.03>>0.99  -Will adjust Cefepime from 2 gm IV Q12H to Q8h. Follow for CNS effects and/or source determination  -Will adjust from Vancomycin 1750 mg IV Q48H to Q24h  AUC = 536 Vanc trough = 11.7 mcg/mL  Scr 0.99   Height: 5\' 10"  (177.8 cm) Weight: 79.4 kg (175 lb) IBW/kg (Calculated) : 73  No data recorded.  Recent Labs  Lab 08/27/21 2355 08/28/21 0101 08/28/21 0145 08/28/21 0351 08/28/21 0522 08/29/21 0716  WBC 7.6  --   --   --  7.3 5.8  CREATININE  --  2.12* 2.03*  --   --  0.99  LATICACIDVEN  --   --   --  1.0  --   --      Estimated Creatinine Clearance: 63.5 mL/min (by C-G formula based on SCr of 0.99 mg/dL).    No Known Allergies  Antimicrobials this admission: -12/23 vancomycin >>  -12/23 flagyl >>  -12/23 cefepime >>   Dose adjustments this admission:   Microbiology results:  12/23 BCx: 1 of 4 GNR  ??BCID 12/23 UCx:  <10,000 COLONIES/mL INSIGNIFICANT GROWTH  Sputum:    12/23 MRSA LTE:IHDTPN but not completed, reordered 12/24  Thank you for allowing pharmacy to be a part of this patients care.  Winner Valeriano A 08/29/2021 9:36 AM
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
## 2021-08-29 NOTE — Progress Notes (Signed)
Pt is very agitated, restless and combative. Geodon IM given.

## 2021-08-29 NOTE — ED Notes (Signed)
Complete linen change; new adult brief; gown; warm blanket

## 2021-08-29 NOTE — ED Notes (Addendum)
Sharion Settler messaged about patient behavior; awaiting new orders (patient still very restless; non-directable; attempting to exit bed after geodon & haldol; please help)

## 2021-08-29 NOTE — Progress Notes (Signed)
PROGRESS NOTE    Clifford Martin  UXL:[id number] DOB: March 30, 1941 DOA: 08/27/2021 PCP: [place]    Brief Narrative:  80 year old male with past medical history significant for essential hypertension, type 2 diabetes mellitus, chronic systolic congestive heart failure, CAD s/p PCI/CABG who presented to [facility] ED on 12/22 via EMS with confusion.  Wife contributed to HPI and states patient was found trying to clean out the kitchen and remove the countertops.  Patient did report discolored urine recently.  He reported history of chronic shortness of breath related to his CHF. Patient denied cough, no fever/chills, no abdominal pain, no diarrhea.  On EMS arrival, patient was found to be hypoxic with SPO2 in the high 80s and placed on supplemental oxygen.   In the ED, temperature 100.1 F, HR 91, RR 27, BP 99/52, SPO2 94% on 3 L nasal cannula.  WBC 7.6, hemoglobin 13.3, platelets 119.  Sodium 136, potassium 3.8, chloride 101, CO2 27, glucose 117, BUN 29, creatinine 2.12, AST 259, ALT 358, total bilirubin 7.1.  BNP 459.6.  Lactic acid 1.0, procalcitonin 5.73.  High sensitive troponin 280> 508> 390.  COVID-19 PCR negative.  Influenza A/B PCR negative.  Urinalysis with amber-colored urine, 100 glucose, large bilirubin, 100 protein; no bacteria and 11-20 WBCs.  EtOH level less than 10.  Acetaminophen level less than 10.  Chest x-ray with mild vascular congestion without edema.  CT head without contrast with mild atrophic changes without acute abnormality.  CT chest/abdomen/pelvis with no acute intrathoracic or intra-abdominal pathology, notable for moderate emphysema with multiple pulmonary nodules most severe 4 mm right solid pulmonary nodule and No intra/extrahepatic biliary ductal dilation.  Given patient's confusion, hypertension, low-grade fever; patient was started on empiric antibiotics for sepsis of unclear etiology.  Hospitalist service consulted for further evaluation  management.  Patient has been intermittently agitated.  Benzodiazepines seem to have a paradoxical effect.  Responds appropriately to administration of intramuscular antipsychotics.   Assessment & Plan:   Principal Problem:   Acute metabolic encephalopathy Active Problems:   Hypertension   Diabetes mellitus without complication (HCC)   AKI (acute kidney injury) ([place])   Pulmonary nodules   Cholelithiasis   Abnormal LFTs   Elevated troponin   Acute respiratory failure with hypoxia (HCC)   Severe sepsis (HCC)   Sepsis (HCC)  Acute metabolic encephalopathy, POA: Improving Patient presenting with confusion, normally alert and oriented per family.  No leukocytosis, lactic acid within normal limits.  Elevated procalcitonin level.  Unclear etiology but suspicion for infectious process versus hypotension versus toxic effects with liver failure. Encephalopathy has been waxing and waning UDS negative, ammonia 42 Plan: Avoid benzodiazepines Use atypical antipsychotics for agitation Frequent reorienting measures Treatment for presumed intra-abdominal sepsis as below   Severe sepsis, POA Patient met sepsis criteria on admission with tachypnea, tachycardia, fever, elevated procalcitonin in the setting of end organ damage with acute renal failure, acute liver failure, acute metabolic encephalopathy and MAP <70.   Chest x-ray with mild pulmonary vascular congestion, otherwise unrevealing.   CT chest/abdomen/pelvis with no acute intrathoracic or intra-abdominal findings. Renal and hepatic indices appear to be improving PCI D1/4 E. coli, no resistance Plan: De-escalate cefepime to Rocephin Continue Flagyl Continue vancomycin, check MRSA screen.  If negative can DC Vanco Monitor vitals and fever curve MRCP abdomen rule out biliary pathology   Hypotension Hx hypertension Patient was notably hypotensive on presentation.   Unclear etiology, sepsis as above versus overly controlled  BP. Plan: Hold home  antihypertensives Monitor BP and restart as appropriate   Elevated troponin likely secondary to type II demand ischemia High sensitive troponin 280>508>390.   EKG with normal sinus rhythm without dynamic changes.   Denies chest pain.  Initially was started on a heparin drip, currently discontinued  Suspect supply demand ischemia  Plan:  Telemetry monitoring  EKG as needed chest pain    Acute liver injury with elevated bilirubin Patient presenting with acute confusion as above.   Was noted to have elevated LFTs on presentation with elevated total bilirubin.   CT abdomen/pelvis with no intrahepatic extrahepatic biliary ductal dilation and right upper quadrant ultrasound unrevealing.   EtOH level less than 10, Tylenol level less than 10.   Unclear etiology but suspect possible mild shock liver from hypotension. LFTs downtrending --AST 259>237>160>88 --ALT 358>340>264>190 --Tbili 7.1>6.5>5.6>3.8 --Acute hepatitis panel negative. Ammonia level 42 Plan: Avoid hepatotoxins Hold home statin Follow-up MRCP Daily LFTs  Acute renal failure, improved Baseline creatinine 0.98 on 04/22/2021.   Creatinine elevated 2.12 on presentation.  Suspect prerenal azotemia from dehydration versus ATN from hypotension. --Cr 2.12>2.03>0.99 Plan: Continue IVF for now Home lisinopril on hold Avoid nephrotoxins Monitor BMP    Elevated D-dimer Patient was found hypoxic by EMS and placed on supplemental oxygen. --Nuclear medicine VQ scan: Negative   Chronic systolic/diastolic congestive heart failure Follows with cardiology at [facility].  Recent TTE 04/21/2021 with LVEF 40-45%, LV mildly dilated, LV systolic function moderately decreased, grade 2 diastolic dysfunction, moderate MR, LA/RA mildly dilated, RV mildly dilated.  Home medication regimen includes metoprolol tartrate 50 mg p.o. twice daily, lisinopril 5 mg p.o. twice daily, furosemide 20 mg p.o. daily.  Chest x-ray on admission  with mild pulmonary edema and was notably hypotensive. Plan: --Hold home and hypertensive regimen and watch BP closely --Strict I's and O's Daily weights   Hyperlipidemia: Atorvastatin 40 mg p.o. daily, on hold   Type 2 diabetes mellitus On metformin outpatient. --SSI for coverage --CBGs qAC/HS   CAD s/p PCI/CABG --Continue Plavix  -- Holding statin as above due to acute liver injury   DVT prophylaxis: SQ Lovenox Code Status: Full Family Communication: Attempted to call spouse Yeshaya Vath [phone] on 12/24.  No answer.  Voicemail not left on generic message Disposition Plan: Status is: Inpatient  Remains inpatient appropriate because: Sepsis, acute metabolic encephalopathy.  Disposition plan pending.       Level of care: Telemetry Medical  Consultants:  None  Procedures:  None  Antimicrobials: Ceftriaxone Metronidazole Vancomycin   Subjective: Seen and examined.  Lethargic.  Unable to provide history.  Objective: Vitals:   08/29/21 0703 08/29/21 1000 08/29/21 1045 08/29/21 1049  BP: 132/75 130/68 119/89   Pulse: 91 100 (!) 108 100  Resp: _0 Temp:  100 F (37.8 C) 97.8 F (36.6 C)   TempSrc:  Axillary    SpO2: 98% 98% 94%   Weight:      Height:        Intake/Output Summary (Last 24 hours) at 08/29/2021 1118 Last data filed at 08/29/2021 1048 Gross per 24 hour  Intake --  Output 600 ml  Net -600 ml   Filed Weights   08/28/21 0302  Weight: 79.4 kg    Examination:  General exam: Lethargic Respiratory system: Scattered crackles.  Normal work of breathing.  2 L Cardiovascular system: S1-S2, RRR, no murmurs, no pedal edema Gastrointestinal system: Soft, NT/ND, normal bowel sounds Central nervous system: Lethargic.  Oriented x0 Extremities: Unable to assess  power.  No BLE edema Skin: No rashes, lesions or ulcers Psychiatry: Judgement and insight appear impaired. Mood & affect flattened.     Data Reviewed: I have personally  reviewed following labs and imaging studies  CBC: Recent Labs  Lab 08/27/21 2355 08/28/21 0522 08/29/21 0716  WBC 7.6 7.3 5.8  NEUTROABS 6.5  --   --   HGB 12.3* 12.3* 12.7*  HCT 36.7* 35.9* 36.5*  MCV 92.7 91.6 88.8  PLT 119* 120* 426*   Basic Metabolic Panel: Recent Labs  Lab 08/28/21 0101 08/28/21 0145 08/29/21 0716  NA 136 137 140  K 3.8 4.1 3.6  CL 101 102 105  CO2 _0 GLUCOSE 117* 113* 131*  BUN 29* 27* 21  CREATININE 2.12* 2.03* 0.99  CALCIUM 8.4* 8.4* 8.6*   GFR: Estimated Creatinine Clearance: 63.5 mL/min (by C-G formula based on SCr of 0.99 mg/dL). Liver Function Tests: Recent Labs  Lab 08/28/21 0101 08/28/21 0145 08/28/21 0954 08/29/21 0716  AST 259* 237* 160* 88*  ALT 358* 340* 264* 190*  ALKPHOS 133* 124 114 109  BILITOT 7.1* 6.5* 5.6* 3.8*  PROT 6.4* 6.1* 5.8* 6.3*  ALBUMIN 3.7 3.5 3.2* 3.4*   Recent Labs  Lab 08/28/21 0351  LIPASE 25   Recent Labs  Lab 08/28/21 0954  AMMONIA 42*   Coagulation Profile: Recent Labs  Lab 08/28/21 0632 08/29/21 0716  INR 1.2 1.1   Cardiac Enzymes: No results for input(s): CKTOTAL, CKMB, CKMBINDEX, TROPONINI in the last 168 hours. BNP (last 3 results) No results for input(s): PROBNP in the last 8760 hours. HbA1C: No results for input(s): HGBA1C in the last 72 hours. CBG: Recent Labs  Lab 08/28/21 0017 08/28/21 1147 08/28/21 1957 08/28/21 2146 08/29/21 1108  GLUCAP 105* 108* 117* 109* 120*   Lipid Profile: No results for input(s): CHOL, HDL, LDLCALC, TRIG, CHOLHDL, LDLDIRECT in the last 72 hours. Thyroid Function Tests: Recent Labs    08/28/21 0351  TSH 0.676   Anemia Panel: No results for input(s): VITAMINB12, FOLATE, FERRITIN, TIBC, IRON, RETICCTPCT in the last 72 hours. Sepsis Labs: Recent Labs  Lab 08/28/21 0351 08/29/21 0716  PROCALCITON 5.73 1.82  LATICACIDVEN 1.0  --     Recent Results (from the past 240 hour(s))  Resp Panel by RT-PCR (Flu A&B, Covid)  Nasopharyngeal Swab     Status: None   Collection Time: 08/28/21 12:20 AM   Specimen: Nasopharyngeal Swab; Nasopharyngeal(NP) swabs in vial transport medium  Result Value Ref Range Status   SARS Coronavirus 2 by RT PCR NEGATIVE NEGATIVE Final    Comment: (NOTE) SARS-CoV-2 target nucleic acids are NOT DETECTED.  The SARS-CoV-2 RNA is generally detectable in upper respiratory specimens during the acute phase of infection. The lowest concentration of SARS-CoV-2 viral copies this assay can detect is 138 copies/mL. A negative result does not preclude SARS-Cov-2 infection and should not be used as the sole basis for treatment or other patient management decisions. A negative result may occur with  improper specimen collection/handling, submission of specimen other than nasopharyngeal swab, presence of viral mutation(s) within the areas targeted by this assay, and inadequate number of viral copies(<138 copies/mL). A negative result must be combined with clinical observations, patient history, and epidemiological information. The expected result is Negative.  Fact Sheet for Patients:  EntrepreneurPulse.com.au  Fact Sheet for Healthcare Providers:  IncredibleEmployment.be  This test is no t yet approved or cleared by the [place] FDA and  has been authorized for detection and/or  diagnosis of SARS-CoV-2 by FDA under an Emergency Use Authorization (EUA). This EUA will remain  in effect (meaning this test can be used) for the duration of the COVID-19 declaration under Section 564(b)(1) of the Act, 21 U.S.C.section 360bbb-3(b)(1), unless the authorization is terminated  or revoked sooner.       Influenza A by PCR NEGATIVE NEGATIVE Final   Influenza B by PCR NEGATIVE NEGATIVE Final    Comment: (NOTE) The Xpert Xpress SARS-CoV-2/FLU/RSV plus assay is intended as an aid in the diagnosis of influenza from Nasopharyngeal swab specimens and should not be  used as a sole basis for treatment. Nasal washings and aspirates are unacceptable for Xpert Xpress SARS-CoV-2/FLU/RSV testing.  Fact Sheet for Patients: EntrepreneurPulse.com.au  Fact Sheet for Healthcare Providers: IncredibleEmployment.be  This test is not yet approved or cleared by the [place] FDA and has been authorized for detection and/or diagnosis of SARS-CoV-2 by FDA under an Emergency Use Authorization (EUA). This EUA will remain in effect (meaning this test can be used) for the duration of the COVID-19 declaration under Section 564(b)(1) of the Act, 21 U.S.C. section 360bbb-3(b)(1), unless the authorization is terminated or revoked.  Performed at [facility], [address], [place], [place] [address]   Urine Culture     Status: Abnormal   Collection Time: 08/28/21 12:20 AM   Specimen: Urine, Random  Result Value Ref Range Status   Specimen Description   Final    URINE, RANDOM Performed at [facility], [address] [place], [place] [address]    Special Requests   Final    NONE Performed at [facility], [address] [place], [place] [address]    Culture (A)  Final    <10,000 COLONIES/mL INSIGNIFICANT GROWTH Performed at [facility], [place] [address] [place], [place] [address]    Report Status 08/29/2021 FINAL  Final  Culture, blood (Routine X 2) w Reflex to ID Panel     Status: None (Preliminary result)   Collection Time: 08/28/21  3:51 AM   Specimen: BLOOD  Result Value Ref Range Status   Specimen Description BLOOD  Final   Special Requests   Final    BOTTLES DRAWN AEROBIC AND ANAEROBIC Blood Culture adequate volume   Culture  Setup Time   Final    AEROBIC BOTTLE ONLY GRAM NEGATIVE RODS Organism ID to follow CRITICAL RESULT CALLED TO, READ BACK BY AND VERIFIED WITH: SUSAN WATSON _0  08/29/21 MJU Performed at [facility], [address] [place], [place]  [address]    Culture GRAM NEGATIVE RODS  Final   Report Status PENDING  Incomplete  Blood Culture ID Panel (Reflexed)     Status: Abnormal   Collection Time: 08/28/21  3:51 AM  Result Value Ref Range Status   Enterococcus faecalis NOT DETECTED NOT DETECTED Final   Enterococcus Faecium NOT DETECTED NOT DETECTED Final   Listeria monocytogenes NOT DETECTED NOT DETECTED Final   Staphylococcus species NOT DETECTED NOT DETECTED Final   Staphylococcus aureus (BCID) NOT DETECTED NOT DETECTED Final   Staphylococcus epidermidis NOT DETECTED NOT DETECTED Final   Staphylococcus lugdunensis NOT DETECTED NOT DETECTED Final   Streptococcus species NOT DETECTED NOT DETECTED Final   Streptococcus agalactiae NOT DETECTED NOT DETECTED Final   Streptococcus pneumoniae NOT DETECTED NOT DETECTED Final   Streptococcus pyogenes NOT DETECTED NOT DETECTED Final   A.calcoaceticus-baumannii NOT DETECTED NOT DETECTED Final   Bacteroides fragilis NOT DETECTED NOT DETECTED Final   Enterobacterales  DETECTED (A) NOT DETECTED Final    Comment: Enterobacterales represent a large order of gram negative bacteria, not a single organism. CRITICAL RESULT CALLED TO, READ BACK BY AND VERIFIED WITH: SUSAN WATSON _0  08/29/21 MJU    Enterobacter cloacae complex NOT DETECTED NOT DETECTED Final   Escherichia coli DETECTED (A) NOT DETECTED Final    Comment: CRITICAL RESULT CALLED TO, READ BACK BY AND VERIFIED WITH: SUSAN WATSON _1  08/29/21 MJU    Klebsiella aerogenes NOT DETECTED NOT DETECTED Final   Klebsiella oxytoca NOT DETECTED NOT DETECTED Final   Klebsiella pneumoniae NOT DETECTED NOT DETECTED Final   Proteus species NOT DETECTED NOT DETECTED Final   Salmonella species NOT DETECTED NOT DETECTED Final   Serratia marcescens NOT DETECTED NOT DETECTED Final   Haemophilus influenzae NOT DETECTED NOT DETECTED Final   Neisseria meningitidis NOT DETECTED NOT DETECTED Final   Pseudomonas aeruginosa NOT DETECTED NOT DETECTED  Final   Stenotrophomonas maltophilia NOT DETECTED NOT DETECTED Final   Candida albicans NOT DETECTED NOT DETECTED Final   Candida auris NOT DETECTED NOT DETECTED Final   Candida glabrata NOT DETECTED NOT DETECTED Final   Candida krusei NOT DETECTED NOT DETECTED Final   Candida parapsilosis NOT DETECTED NOT DETECTED Final   Candida tropicalis NOT DETECTED NOT DETECTED Final   Cryptococcus neoformans/gattii NOT DETECTED NOT DETECTED Final   CTX-M ESBL NOT DETECTED NOT DETECTED Final   Carbapenem resistance IMP NOT DETECTED NOT DETECTED Final   Carbapenem resistance KPC NOT DETECTED NOT DETECTED Final   Carbapenem resistance NDM NOT DETECTED NOT DETECTED Final   Carbapenem resist OXA 48 LIKE NOT DETECTED NOT DETECTED Final   Carbapenem resistance VIM NOT DETECTED NOT DETECTED Final    Comment: Performed at [facility], [address] [place], [place] [address]  Culture, blood (Routine X 2) w Reflex to ID Panel     Status: None (Preliminary result)   Collection Time: 08/28/21  3:54 AM   Specimen: BLOOD  Result Value Ref Range Status   Specimen Description BLOOD  Final   Special Requests   Final    BOTTLES DRAWN AEROBIC AND ANAEROBIC Blood Culture adequate volume   Culture   Final    NO GROWTH 1 DAY Performed at [facility], [address], [place], [place] [address]    Report Status PENDING  Incomplete         Radiology Studies: CT HEAD WO CONTRAST (5MM)  Result Date: 08/29/2021 CLINICAL DATA:  Head trauma. EXAM: CT HEAD WITHOUT CONTRAST TECHNIQUE: Contiguous axial images were obtained from the base of the skull through the vertex without intravenous contrast. COMPARISON:  Head CT dated 08/28/2021. FINDINGS: Brain: Mild age-related atrophy and chronic microvascular ischemic changes. There is no acute intracranial hemorrhage. No mass effect or midline shift. No extra-axial fluid collection. Vascular: No hyperdense vessel or unexpected calcification. Skull:  Normal. Negative for fracture or focal lesion. Sinuses/Orbits: Mild mucoperiosteal thickening of paranasal sinuses. No air-fluid level. Mastoid air cells are clear. Other: None IMPRESSION: 1. No acute intracranial pathology. 2. Mild age-related atrophy and chronic microvascular ischemic changes. Electronically Signed   By: Anner Crete M.D.   On: 08/29/2021 03:38   CT HEAD WO CONTRAST (5MM)  Result Date: 08/28/2021 CLINICAL DATA:  Altered mental status, possible UTI EXAM: CT HEAD WITHOUT CONTRAST TECHNIQUE: Contiguous axial images were obtained from the base of the skull through the vertex without intravenous contrast. COMPARISON:  None. FINDINGS: Brain: No evidence of acute infarction, hemorrhage, hydrocephalus, extra-axial  collection or mass lesion/mass effect. Mild atrophic changes are noted commensurate with the patient's given age. Vascular: No hyperdense vessel or unexpected calcification. Skull: Normal. Negative for fracture or focal lesion. Sinuses/Orbits: No acute finding. Other: None. IMPRESSION: Mild atrophic changes without acute abnormality. Electronically Signed   By: Inez Catalina M.D.   On: 08/28/2021 00:15   NM Pulmonary Perfusion  Result Date: 08/28/2021 CLINICAL DATA:  80 year old male with abnormal D-dimer. EXAM: NUCLEAR MEDICINE PERFUSION LUNG SCAN TECHNIQUE: Perfusion images were obtained in multiple projections after intravenous injection of radiopharmaceutical. Ventilation scans intentionally deferred if perfusion scan and chest x-ray adequate for interpretation during COVID 19 epidemic. RADIOPHARMACEUTICALS:  4.1 mCi Tc-14mMAA IV COMPARISON:  Noncontrast chest CT 0256 hours today, and portable chest 0020 hours. FINDINGS: Good radiotracer activity in both lungs. In most areas perfusion activity appears homogeneous. However, there is a small perfusion defect in the periphery of the mid right lung. And in this region on the earlier CT is chronic lung disease with emphysema and  scarring. Similar small defect at the anterior left lung is felt related to mediastinal lipomatosis as seen on the earlier CT. No other convincing perfusion defect. IMPRESSION: Pulmonary embolus Unlikely. Subtle perfusion defect(s) only in areas of emphysema and lung scarring on the CT earlier today. Electronically Signed   By: HGenevie AnnM.D.   On: 08/28/2021 11:59   DG Chest Portable 1 View  Result Date: 08/28/2021 CLINICAL DATA:  Altered mental status EXAM: PORTABLE CHEST 1 VIEW COMPARISON:  None. FINDINGS: Cardiac shadow is mildly prominent. Postsurgical changes are noted. Mild vascular congestion is seen. No edema is noted. No focal infiltrate is noted. No bony abnormality is seen. IMPRESSION: Mild vascular congestion without edema. Electronically Signed   By: MInez CatalinaM.D.   On: 08/28/2021 00:32   CT CHEST ABDOMEN PELVIS WO CONTRAST  Result Date: 08/28/2021 CLINICAL DATA:  Chronic dyspnea EXAM: CT CHEST, ABDOMEN AND PELVIS WITHOUT CONTRAST TECHNIQUE: Multidetector CT imaging of the chest, abdomen and pelvis was performed following the standard protocol without IV contrast. COMPARISON:  None. FINDINGS: CT CHEST FINDINGS Cardiovascular: Coronary artery bypass grafting has been performed. Cardiac size within normal limits. No pericardial effusion. Central pulmonary arteries are enlarged in keeping with changes of pulmonary arterial hypertension. Moderate atherosclerotic calcification within the thoracic aorta. No aortic aneurysm. Mediastinum/Nodes: No enlarged mediastinal, hilar, or axillary lymph nodes. Thyroid gland, trachea, and esophagus demonstrate no significant findings. Lungs/Pleura: Moderate emphysema. Right basilar pleural thickening noted posteriorly with associated small focus of rounded atelectasis within the posterior basal right lower lobe. Two 4 mm noncalcified pulmonary nodules are seen within the subpleural right middle lobe, axial image # [id number], series 4, indeterminate. No focal  pulmonary infiltrate. No pneumothorax or pleural effusion. Musculoskeletal: No chest wall mass or suspicious bone lesions identified. CT ABDOMEN PELVIS FINDINGS Hepatobiliary: Cholelithiasis without pericholecystic inflammatory change. Liver unremarkable. No intra or extrahepatic biliary ductal dilation. Pancreas: Unremarkable Spleen: Mild splenomegaly is present with the spleen measuring 15.9 cm in greatest dimension. No intrasplenic lesions are seen. Adrenals/Urinary Tract: Adrenal glands are unremarkable. Kidneys are normal, without renal calculi, focal lesion, or hydronephrosis. Bladder is unremarkable. Stomach/Bowel: Moderate to severe descending and sigmoid colonic diverticulosis without superimposed acute inflammatory change. Stomach, small bowel, and large bowel are otherwise unremarkable. Appendix normal. No free intraperitoneal gas or fluid. Vascular/Lymphatic: Moderate aortoiliac atherosclerotic calcification. No aortic aneurysm. No pathologic adenopathy within the abdomen and pelvis. Reproductive: Prostate is unremarkable. Other: Bilateral inguinal hernia repair with mesh has  been performed. No recurrent abdominal wall hernia. Musculoskeletal: No acute bone abnormality. No lytic or blastic bone lesion. IMPRESSION: No acute intrathoracic or intra-abdominal pathology identified. Moderate emphysema. Multiple pulmonary nodules. Most severe: 4 mm right solid pulmonary nodule. No routine follow-up imaging is recommended per Fleischner Society Guidelines. These guidelines do not apply to immunocompromised patients and patients with cancer. Follow up in patients with significant comorbidities as clinically warranted. For lung cancer screening, adhere to Lung-RADS guidelines. Reference: Radiology. 2017; 284(1):228-43. Morphologic changes in keeping with pulmonary arterial hypertension. Cholelithiasis. Mild splenomegaly. Moderate to severe distal colonic diverticulosis. No superimposed acute inflammatory change.  Aortic Atherosclerosis (ICD10-I70.0) and Emphysema (ICD10-J43.9). Electronically Signed   By: Fidela Salisbury M.D.   On: 08/28/2021 03:19   US ABDOMEN LIMITED RUQ (LIVER/GB)  Result Date: 08/28/2021 CLINICAL DATA:  Elevated liver enzymes EXAM: ULTRASOUND ABDOMEN LIMITED RIGHT UPPER QUADRANT COMPARISON:  08/28/2021 FINDINGS: Gallbladder: Cholelithiasis. No gallbladder wall thickening or pericholecystic fluid. Largest gallstone measures 8.5 mm. Negative sonographic Murphy sign. Common bile duct: Diameter: 6 mm Liver: No focal lesion identified. Within normal limits in parenchymal echogenicity. Portal vein is patent on color Doppler imaging with normal direction of blood flow towards the liver. Other: None. IMPRESSION: Cholelithiasis without sonographic evidence of acute cholecystitis. Electronically Signed   By: Kathreen Devoid M.D.   On: 08/28/2021 07:37        Scheduled Meds:  clopidogrel  75 mg Oral Daily   gabapentin  100 mg Oral TID   heparin injection (subcutaneous)  5,000 Units Subcutaneous Q8H   insulin aspart  0-5 Units Subcutaneous QHS   insulin aspart  0-9 Units Subcutaneous TID WC   Continuous Infusions:  ceFEPime (MAXIPIME) IV     lactated ringers Stopped (08/29/21 0356)   metronidazole Stopped (08/29/21 0112)   vancomycin       LOS: 1 day    Time spent: 35 minutes    Sidney Ace, MD [facility]   If 7PM-7AM, please contact night-coverage  08/29/2021, 11:18 AM  .

## 2021-08-29 NOTE — Progress Notes (Signed)
Patient arrived to unit from ED.
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
## 2021-08-29 NOTE — ED Notes (Signed)
Informed RN bed assigned 

## 2021-08-29 NOTE — ED Notes (Signed)
NP Randol Kern made aware about pt hitting head, see orders for details.

## 2021-08-29 NOTE — ED Notes (Signed)
Pt transported to CT ?

## 2021-08-29 NOTE — ED Notes (Signed)
Patient restless; yelling; attempting to unsafely exit bed even with encouragement from NT and RN

## 2021-08-29 NOTE — Progress Notes (Signed)
°   08/29/21 2205  Assess: MEWS Score  Temp 98.6 F (37 C)  BP (!) 161/78  Pulse Rate (!) 127  Resp 20  SpO2 100 %  O2 Device Nasal Cannula  O2 Flow Rate (L/min) 2 L/min  Assess: MEWS Score  MEWS Temp 0  MEWS Systolic 0  MEWS Pulse 2  MEWS RR 0  MEWS LOC 1  MEWS Score 3  MEWS Score Color Yellow  Assess: if the MEWS score is Yellow or Red  Were vital signs taken at a resting state? Yes  Focused Assessment Change from prior assessment (see assessment flowsheet)  Does the patient meet 2 or more of the SIRS criteria? No  MEWS guidelines implemented *See Row Information* Yes  Treat  MEWS Interventions Administered prn meds/treatments  Pain Scale 0-10  Pain Score 0  Take Vital Signs  Increase Vital Sign Frequency  Yellow: Q 2hr X 2 then Q 4hr X 2, if remains yellow, continue Q 4hrs  Escalate  MEWS: Escalate Yellow: discuss with charge nurse/RN and consider discussing with provider and RRT  Notify: Charge Nurse/RN  Name of Charge Nurse/RN Notified Marcella RN  Date Charge Nurse/RN Notified 08/29/21  Time Charge Nurse/RN Notified 2226  Notify: Provider  Provider Name/Title Sharion Settler RN  Date Provider Notified 08/29/21  Time Provider Notified 2226  Notification Type Face-to-face  Notification Reason Other (Comment) (BP&HR elevated)  Provider response No new orders  Date of Provider Response 08/29/21  Time of Provider Response 2227  Document  Patient Outcome Other (Comment) (will continue to monitor)  Assess: SIRS CRITERIA  SIRS Temperature  0  SIRS Pulse 1  SIRS Respirations  0  SIRS WBC 0  SIRS Score Sum  1    Pt was agitated and restless; geodon given. Pt now sleeping but HR still elevated upon rechecking. Oncall provider informed with no new orders. Will cont to monitor.
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
## 2021-08-29 NOTE — ED Notes (Signed)
Pt got up once again and swung at this Probation officer, Teacher, adult education for assistance. Pt walked into hallway and Security escorted him back to his bed. Security, Scientific laboratory technician, Risk analyst present in room at this time.

## 2021-08-29 NOTE — ED Notes (Signed)
Patient is resting comfortably; sitter at bedside

## 2021-08-29 NOTE — ED Notes (Signed)
Pt is very agitated and swinging at staff. IM geodon given at this time

## 2021-08-30 LAB — GLUCOSE, CAPILLARY
Glucose-Capillary: 141 mg/dL — ABNORMAL HIGH (ref 70–99)
Glucose-Capillary: 146 mg/dL — ABNORMAL HIGH (ref 70–99)
Glucose-Capillary: 147 mg/dL — ABNORMAL HIGH (ref 70–99)
Glucose-Capillary: 141 mg/dL — ABNORMAL HIGH (ref 70–99)

## 2021-08-30 LAB — BASIC METABOLIC PANEL
Anion gap: 13 (ref 5–15)
BUN: 21 mg/dL (ref 8–23)
CO2: 25 mmol/L (ref 22–32)
Calcium: 9 mg/dL (ref 8.9–10.3)
Chloride: 101 mmol/L (ref 98–111)
Creatinine, Ser: 0.95 mg/dL (ref 0.61–1.24)
GFR, Estimated: >60 mL/min (ref >60)
Glucose, Bld: 141 mg/dL — ABNORMAL HIGH (ref 70–99)
Potassium: 2.9 mmol/L — ABNORMAL LOW (ref 3.5–5.1)
Sodium: 139 mmol/L (ref 135–145)

## 2021-08-30 LAB — CBC WITH DIFFERENTIAL/PLATELET
Abs Immature Granulocytes: 0.04 10*3/uL (ref 0.00–0.07)
Basophils Absolute: 0 10*3/uL (ref 0.0–0.1)
Basophils Relative: 0 %
Eosinophils Absolute: 0 10*3/uL (ref 0.0–0.5)
Eosinophils Relative: 0 %
HCT: 40.5 % (ref 39.0–52.0)
Hemoglobin: 14.2 g/dL (ref 13.0–17.0)
Immature Granulocytes: 1 %
Lymphocytes Relative: 12 %
Lymphs Abs: 0.8 10*3/uL (ref 0.7–4.0)
MCH: 30.6 pg (ref 26.0–34.0)
MCHC: 35.1 g/dL (ref 30.0–36.0)
MCV: 87.3 fL (ref 80.0–100.0)
Monocytes Absolute: 1 10*3/uL (ref 0.1–1.0)
Monocytes Relative: 13 %
Neutro Abs: 5.4 10*3/uL (ref 1.7–7.7)
Neutrophils Relative %: 74 %
Platelets: 153 10*3/uL (ref 150–400)
RBC: 4.64 MIL/uL (ref 4.22–5.81)
RDW: 13.3 % (ref 11.5–15.5)
WBC: 7.3 10*3/uL (ref 4.0–10.5)
nRBC: 0 % (ref 0.0–0.2)

## 2021-08-30 LAB — HEPATIC FUNCTION PANEL
ALT: 147 U/L — ABNORMAL HIGH (ref 0–44)
AST: 58 U/L — ABNORMAL HIGH (ref 15–41)
Albumin: 3.6 g/dL (ref 3.5–5.0)
Alkaline Phosphatase: 104 U/L (ref 38–126)
Bilirubin, Direct: 1.2 mg/dL — ABNORMAL HIGH (ref 0.0–0.2)
Indirect Bilirubin: 2.1 mg/dL — ABNORMAL HIGH (ref 0.3–0.9)
Total Bilirubin: 3.3 mg/dL — ABNORMAL HIGH (ref 0.3–1.2)
Total Protein: 6.9 g/dL (ref 6.5–8.1)

## 2021-08-30 MED ORDER — FUROSEMIDE 20 MG PO TABS
20.0000 mg | ORAL_TABLET | Freq: Every day | ORAL | Status: DC
  Administered 2021-08-30 – 2021-08-31 (×2): 20 mg via ORAL
  Filled 2021-08-30 (×3): qty 1

## 2021-08-30 MED ORDER — DILTIAZEM HCL 25 MG/5ML IV SOLN
10.0000 mg | Freq: Once | INTRAVENOUS | Status: AC
  Administered 2021-08-30: 12:00:00 10 mg via INTRAVENOUS
  Filled 2021-08-30: qty 5

## 2021-08-30 MED ORDER — ACETYLCYSTEINE 20 % IN SOLN
4.0000 mL | Freq: Two times a day (BID) | RESPIRATORY_TRACT | Status: DC
  Administered 2021-08-30 – 2021-09-01 (×4): 4 mL via RESPIRATORY_TRACT
  Filled 2021-08-30 (×5): qty 4

## 2021-08-30 MED ORDER — METOPROLOL TARTRATE 50 MG PO TABS
50.0000 mg | ORAL_TABLET | Freq: Two times a day (BID) | ORAL | Status: DC
  Administered 2021-08-30 – 2021-09-01 (×5): 50 mg via ORAL
  Filled 2021-08-30 (×5): qty 1

## 2021-08-30 MED ORDER — POTASSIUM CHLORIDE CRYS ER 20 MEQ PO TBCR
40.0000 meq | EXTENDED_RELEASE_TABLET | ORAL | Status: AC
  Administered 2021-08-30 (×3): 40 meq via ORAL
  Filled 2021-08-30 (×3): qty 2

## 2021-08-30 MED ORDER — IPRATROPIUM-ALBUTEROL 0.5-2.5 (3) MG/3ML IN SOLN
3.0000 mL | Freq: Two times a day (BID) | RESPIRATORY_TRACT | Status: DC
  Administered 2021-08-30 – 2021-09-01 (×4): 3 mL via RESPIRATORY_TRACT
  Filled 2021-08-30 (×4): qty 3

## 2021-08-30 MED ORDER — SODIUM CHLORIDE 0.9 % IV BOLUS
250.0000 mL | Freq: Once | INTRAVENOUS | Status: AC
Start: 2021-08-30 — End: 2021-08-30
  Administered 2021-08-30: 11:00:00 250 mL via INTRAVENOUS

## 2021-08-30 MED ORDER — ZIPRASIDONE MESYLATE 20 MG IM SOLR
20.0000 mg | INTRAMUSCULAR | Status: DC | PRN
  Administered 2021-08-30 – 2021-08-31 (×2): 20 mg via INTRAMUSCULAR
  Filled 2021-08-30 (×3): qty 20

## 2021-08-30 MED ORDER — LISINOPRIL 10 MG PO TABS
5.0000 mg | ORAL_TABLET | Freq: Two times a day (BID) | ORAL | Status: DC
  Administered 2021-08-30 – 2021-09-01 (×5): 5 mg via ORAL
  Filled 2021-08-30 (×5): qty 1

## 2021-08-30 MED ORDER — HALOPERIDOL LACTATE 5 MG/ML IJ SOLN
10.0000 mg | Freq: Four times a day (QID) | INTRAMUSCULAR | Status: DC | PRN
  Administered 2021-08-30: 18:00:00 10 mg via INTRAMUSCULAR
  Filled 2021-08-30 (×2): qty 2

## 2021-08-30 MED ORDER — TRAZODONE HCL 50 MG PO TABS: 50.0000 mg | ORAL_TABLET | Freq: Every evening | ORAL | Status: DC | PRN
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
## 2021-08-30 NOTE — Progress Notes (Signed)
PROGRESS NOTE    Clifford Martin  YYQ:[id number] DOB: [birth year] DOA: 08/27/2021 PCP: [place]    Brief Narrative:  80 year old male with past medical history significant for essential hypertension, type 2 diabetes mellitus, chronic systolic congestive heart failure, CAD s/p PCI/CABG who presented to [facility] ED on 12/22 via EMS with confusion.  Wife contributed to HPI and states patient was found trying to clean out the kitchen and remove the countertops.  Patient did report discolored urine recently.  He reported history of chronic shortness of breath related to his CHF. Patient denied cough, no fever/chills, no abdominal pain, no diarrhea.  On EMS arrival, patient was found to be hypoxic with SPO2 in the high 80s and placed on supplemental oxygen.   In the ED, temperature 100.1 F, HR 91, RR 27, BP 99/52, SPO2 94% on 3 L nasal cannula.  WBC 7.6, hemoglobin 13.3, platelets 119.  Sodium 136, potassium 3.8, chloride 101, CO2 27, glucose 117, BUN 29, creatinine 2.12, AST 259, ALT 358, total bilirubin 7.1.  BNP 459.6.  Lactic acid 1.0, procalcitonin 5.73.  High sensitive troponin 280> 508> 390.  COVID-19 PCR negative.  Influenza A/B PCR negative.  Urinalysis with amber-colored urine, 100 glucose, large bilirubin, 100 protein; no bacteria and 11-20 WBCs.  EtOH level less than 10.  Acetaminophen level less than 10.  Chest x-ray with mild vascular congestion without edema.  CT head without contrast with mild atrophic changes without acute abnormality.  CT chest/abdomen/pelvis with no acute intrathoracic or intra-abdominal pathology, notable for moderate emphysema with multiple pulmonary nodules most severe 4 mm right solid pulmonary nodule and No intra/extrahepatic biliary ductal dilation.  Given patient's confusion, hypertension, low-grade fever; patient was started on empiric antibiotics for sepsis of unclear etiology.  Hospitalist service consulted for further evaluation  management.  Patient has been intermittently agitated.  Benzodiazepines seem to have a paradoxical effect.  Responds appropriately to administration of intramuscular antipsychotics.   Assessment & Plan:   Principal Problem:   Acute metabolic encephalopathy Active Problems:   Hypertension   Diabetes mellitus without complication (HCC)   AKI (acute kidney injury) ([place])   Pulmonary nodules   Cholelithiasis   Abnormal LFTs   Elevated troponin   Acute respiratory failure with hypoxia (HCC)   Severe sepsis (HCC)   Sepsis (HCC)  Acute metabolic encephalopathy, POA: Improving Patient presenting with confusion, normally alert and oriented per family.  No leukocytosis, lactic acid within normal limits.  Elevated procalcitonin level.  Unclear etiology but suspicion for infectious process versus hypotension versus toxic effects with liver failure. Encephalopathy has been waxing and waning UDS negative, ammonia 42 Suspect delirium superimposed on underlying cognitive decline in the setting of acute infection Plan: No benzos.  Had paradoxical effect.  Added to allergy list Use atypical antipsychotics for agitation Frequent reorienting measures Treatment for presumed intra-abdominal sepsis as below   Severe sepsis, POA Patient met sepsis criteria on admission with tachypnea, tachycardia, fever, elevated procalcitonin in the setting of end organ damage with acute renal failure, acute liver failure, acute metabolic encephalopathy and MAP <70.   Chest x-ray with mild pulmonary vascular congestion, otherwise unrevealing.   CT chest/abdomen/pelvis with no acute intrathoracic or intra-abdominal findings. Renal and hepatic indices appear to be improving BCID 1/4 E. coli, no resistance Plan: Continue Rocephin Continue Flagyl Vancomycin discontinued Monitor vitals and fever curve MRCP abdomen rule out biliary pathology, pending   Hypotension Hx hypertension Patient was notably hypotensive on  presentation.  Unclear etiology, sepsis as above versus overly controlled BP. Blood pressure improving Plan: Resume home amlodipine and metoprolol  New onset tachycardia Suspected new onset atrial fibrillation Plan: metoprolol 50 mg twice daily per home dose As needed IV metoprolol or Cardizem for sustained tachycardia Telemetry monitoring Twelve-lead EKG TTE   Elevated troponin likely secondary to type II demand ischemia High sensitive troponin 280>508>390.   EKG with normal sinus rhythm without dynamic changes.   Denies chest pain.  Initially was started on a heparin drip, currently discontinued  Suspect supply demand ischemia  Plan:  Telemetry monitoring  EKG as needed chest pain    Acute liver injury with elevated bilirubin Patient presenting with acute confusion as above.   Was noted to have elevated LFTs on presentation with elevated total bilirubin.   CT abdomen/pelvis with no intrahepatic extrahepatic biliary ductal dilation and right upper quadrant ultrasound unrevealing.   EtOH level less than 10, Tylenol level less than 10.   Unclear etiology but suspect possible mild shock liver from hypotension. LFTs downtrending --AST 259>237>160>88 --ALT 358>340>264>190 --Tbili 7.1>6.5>5.6>3.8 --Acute hepatitis panel negative. Ammonia level 42 Plan: Avoid hepatotoxins Continue holding statin Follow-up MRCP Daily LFTs  Acute renal failure, improved Baseline creatinine 0.98 on 04/22/2021.   Creatinine elevated 2.12 on presentation.  Suspect prerenal azotemia from dehydration versus ATN from hypotension. --Cr 2.12>2.03>0.99 Plan: Resume home lisinopril Avoid nonessential nephrotoxins Monitor BMP    Elevated D-dimer Patient was found hypoxic by EMS and placed on supplemental oxygen. --Nuclear medicine VQ scan: Negative, low suspicion for PE   Chronic systolic/diastolic congestive heart failure Follows with [facility] at [facility].  Recent TTE 04/21/2021 with LVEF 40-45%,  LV mildly dilated, LV systolic function moderately decreased, grade 2 diastolic dysfunction, moderate MR, LA/RA mildly dilated, RV mildly dilated.  Home medication regimen includes metoprolol tartrate 50 mg p.o. twice daily, lisinopril 5 mg p.o. twice daily, furosemide 20 mg p.o. daily.  Chest x-ray on admission with mild pulmonary edema and was notably hypotensive. Plan: -- Resume home antihypertensives -Resume home diuretics --Strict ins and outs, daily weights   Hyperlipidemia: Atorvastatin 40 mg p.o. daily, on hold   Type 2 diabetes mellitus On metformin outpatient. --SSI for coverage --CBGs qAC/HS   CAD s/p PCI/CABG --Continue Plavix  -- Holding statin as above due to acute liver injury   DVT prophylaxis: SQ Lovenox Code Status: Full Family Communication: Attempted to call spouse Dacota Ruben [phone] on 12/24.  No answer.  Voicemail not left on generic message Disposition Plan: Status is: Inpatient  Remains inpatient appropriate because: Sepsis, acute metabolic encephalopathy, new onset suspect atrial fibrillation.  Disposition plan pending.      Level of care: Med-Surg  Consultants:  None  Procedures:  None  Antimicrobials: Ceftriaxone Metronidazole   Subjective: Seen and examined.  Confused but answers questions appropriately.  Objective: Vitals:   08/30/21 0354 08/30/21 0621 08/30/21 0754 08/30/21 1000  BP: (!) 142/87 (!) 146/80 139/63 111/76  Pulse: (!) 120 (!) 109 66   Resp: '20 20 20   ' Temp: 97.6 F (36.4 C) 98 F (36.7 C) 97.7 F (36.5 C)   TempSrc: Oral Oral Oral   SpO2: 97% 95% 96%   Weight:      Height:        Intake/Output Summary (Last 24 hours) at 08/30/2021 1023 Last data filed at 08/30/2021 7096 Gross per 24 hour  Intake 422.65 ml  Output 2500 ml  Net -2077.35 ml   Filed Weights   08/28/21 0302  Weight:  79.4 kg    Examination:  General exam: Confused.  No apparent distress Respiratory system: Bibasilar crackles.   Normal work of breathing.  2 L Cardiovascular system: S1-S2, tachycardic, irregular rhythm, no murmurs, no pedal edema Gastrointestinal system: Soft, NT/ND, normal bowel sounds Central nervous system: Lethargic.  Oriented x2. Extremities: Unable to assess power.  No BLE edema Skin: No rashes, lesions or ulcers Psychiatry: Judgement and insight appear impaired. Mood & affect flattened.     Data Reviewed: I have personally reviewed following labs and imaging studies  CBC: Recent Labs  Lab 08/27/21 2355 08/28/21 0522 08/29/21 0716 08/29/21 1159 08/30/21 0634  WBC 7.6 7.3 5.8 5.1 7.3  NEUTROABS 6.5  --   --   --  5.4  HGB 12.3* 12.3* 12.7* 13.0 14.2  HCT 36.7* 35.9* 36.5* 37.9* 40.5  MCV 92.7 91.6 88.8 88.1 87.3  PLT 119* 120* 138* 137* 562   Basic Metabolic Panel: Recent Labs  Lab 08/28/21 0101 08/28/21 0145 08/29/21 0716 08/29/21 1159 08/30/21 0634  NA 136 137 140  --  139  K 3.8 4.1 3.6  --  2.9*  CL 101 102 105  --  101  CO2 '27 28 28  ' --  25  GLUCOSE 117* 113* 131*  --  141*  BUN 29* 27* 21  --  21  CREATININE 2.12* 2.03* 0.99 0.91 0.95  CALCIUM 8.4* 8.4* 8.6*  --  9.0   GFR: Estimated Creatinine Clearance: 66.2 mL/min (by C-G formula based on SCr of 0.95 mg/dL). Liver Function Tests: Recent Labs  Lab 08/28/21 0101 08/28/21 0145 08/28/21 0954 08/29/21 0716 08/30/21 0634  AST 259* 237* 160* 88* 58*  ALT 358* 340* 264* 190* 147*  ALKPHOS 133* 124 114 109 104  BILITOT 7.1* 6.5* 5.6* 3.8* 3.3*  PROT 6.4* 6.1* 5.8* 6.3* 6.9  ALBUMIN 3.7 3.5 3.2* 3.4* 3.6   Recent Labs  Lab 08/28/21 0351  LIPASE 25   Recent Labs  Lab 08/28/21 0954  AMMONIA 42*   Coagulation Profile: Recent Labs  Lab 08/28/21 0632 08/29/21 0716  INR 1.2 1.1   Cardiac Enzymes: No results for input(s): CKTOTAL, CKMB, CKMBINDEX, TROPONINI in the last 168 hours. BNP (last 3 results) No results for input(s): PROBNP in the last 8760 hours. HbA1C: No results for input(s): HGBA1C in  the last 72 hours. CBG: Recent Labs  Lab 08/28/21 2146 08/29/21 1108 08/29/21 1539 08/29/21 2156 08/30/21 0807  GLUCAP 109* 120* 127* 110* 141*   Lipid Profile: No results for input(s): CHOL, HDL, LDLCALC, TRIG, CHOLHDL, LDLDIRECT in the last 72 hours. Thyroid Function Tests: Recent Labs    08/28/21 0351  TSH 0.676   Anemia Panel: No results for input(s): VITAMINB12, FOLATE, FERRITIN, TIBC, IRON, RETICCTPCT in the last 72 hours. Sepsis Labs: Recent Labs  Lab 08/28/21 0351 08/29/21 0716  PROCALCITON 5.73 1.82  LATICACIDVEN 1.0  --     Recent Results (from the past 240 hour(s))  Resp Panel by RT-PCR (Flu A&B, Covid) Nasopharyngeal Swab     Status: None   Collection Time: 08/28/21 12:20 AM   Specimen: Nasopharyngeal Swab; Nasopharyngeal(NP) swabs in vial transport medium  Result Value Ref Range Status   SARS Coronavirus 2 by RT PCR NEGATIVE NEGATIVE Final    Comment: (NOTE) SARS-CoV-2 target nucleic acids are NOT DETECTED.  The SARS-CoV-2 RNA is generally detectable in upper respiratory specimens during the acute phase of infection. The lowest concentration of SARS-CoV-2 viral copies this assay can detect is 138 copies/mL. A  negative result does not preclude SARS-Cov-2 infection and should not be used as the sole basis for treatment or other patient management decisions. A negative result may occur with  improper specimen collection/handling, submission of specimen other than nasopharyngeal swab, presence of viral mutation(s) within the areas targeted by this assay, and inadequate number of viral copies(<138 copies/mL). A negative result must be combined with clinical observations, patient history, and epidemiological information. The expected result is Negative.  Fact Sheet for Patients:  EntrepreneurPulse.com.au  Fact Sheet for Healthcare Providers:  IncredibleEmployment.be  This test is no t yet approved or cleared by the  [place] FDA and  has been authorized for detection and/or diagnosis of SARS-CoV-2 by FDA under an Emergency Use Authorization (EUA). This EUA will remain  in effect (meaning this test can be used) for the duration of the COVID-19 declaration under Section 564(b)(1) of the Act, 21 U.S.C.section 360bbb-3(b)(1), unless the authorization is terminated  or revoked sooner.       Influenza A by PCR NEGATIVE NEGATIVE Final   Influenza B by PCR NEGATIVE NEGATIVE Final    Comment: (NOTE) The Xpert Xpress SARS-CoV-2/FLU/RSV plus assay is intended as an aid in the diagnosis of influenza from Nasopharyngeal swab specimens and should not be used as a sole basis for treatment. Nasal washings and aspirates are unacceptable for Xpert Xpress SARS-CoV-2/FLU/RSV testing.  Fact Sheet for Patients: EntrepreneurPulse.com.au  Fact Sheet for Healthcare Providers: IncredibleEmployment.be  This test is not yet approved or cleared by the [place] FDA and has been authorized for detection and/or diagnosis of SARS-CoV-2 by FDA under an Emergency Use Authorization (EUA). This EUA will remain in effect (meaning this test can be used) for the duration of the COVID-19 declaration under Section 564(b)(1) of the Act, 21 U.S.C. section 360bbb-3(b)(1), unless the authorization is terminated or revoked.  Performed at [facility], [address] [place], [place] [address]   Urine Culture     Status: Abnormal   Collection Time: 08/28/21 12:20 AM   Specimen: Urine, Random  Result Value Ref Range Status   Specimen Description   Final    URINE, RANDOM Performed at [facility], [address], [place], [place] [address]    Special Requests   Final    NONE Performed at [facility], [address] [place], [place] [address]    Culture (A)  Final    <10,000 COLONIES/mL INSIGNIFICANT GROWTH Performed at [facility], [place]  [address], [place], [place] [address]    Report Status 08/29/2021 FINAL  Final  Culture, blood (Routine X 2) w Reflex to ID Panel     Status: None (Preliminary result)   Collection Time: 08/28/21  3:51 AM   Specimen: BLOOD  Result Value Ref Range Status   Specimen Description BLOOD  Final   Special Requests   Final    BOTTLES DRAWN AEROBIC AND ANAEROBIC Blood Culture adequate volume   Culture  Setup Time   Final    AEROBIC BOTTLE ONLY GRAM NEGATIVE RODS Organism ID to follow CRITICAL RESULT CALLED TO, READ BACK BY AND VERIFIED WITH: SUSAN WATSON '@1026'  08/29/21 MJU Performed at [facility], [address] [place], [place] [address]    Culture GRAM NEGATIVE RODS  Final   Report Status PENDING  Incomplete  Blood Culture ID Panel (Reflexed)     Status: Abnormal   Collection Time: 08/28/21  3:51 AM  Result Value Ref Range Status   Enterococcus faecalis NOT  DETECTED NOT DETECTED Final   Enterococcus Faecium NOT DETECTED NOT DETECTED Final   Listeria monocytogenes NOT DETECTED NOT DETECTED Final   Staphylococcus species NOT DETECTED NOT DETECTED Final   Staphylococcus aureus (BCID) NOT DETECTED NOT DETECTED Final   Staphylococcus epidermidis NOT DETECTED NOT DETECTED Final   Staphylococcus lugdunensis NOT DETECTED NOT DETECTED Final   Streptococcus species NOT DETECTED NOT DETECTED Final   Streptococcus agalactiae NOT DETECTED NOT DETECTED Final   Streptococcus pneumoniae NOT DETECTED NOT DETECTED Final   Streptococcus pyogenes NOT DETECTED NOT DETECTED Final   A.calcoaceticus-baumannii NOT DETECTED NOT DETECTED Final   Bacteroides fragilis NOT DETECTED NOT DETECTED Final   Enterobacterales DETECTED (A) NOT DETECTED Final    Comment: Enterobacterales represent a large order of gram negative bacteria, not a single organism. CRITICAL RESULT CALLED TO, READ BACK BY AND VERIFIED WITH: Adin '@1026'  08/29/21 MJU    Enterobacter cloacae complex NOT DETECTED NOT DETECTED Final    Escherichia coli DETECTED (A) NOT DETECTED Final    Comment: CRITICAL RESULT CALLED TO, READ BACK BY AND VERIFIED WITH: SUSAN WATSON '@1026'  08/29/21 MJU    Klebsiella aerogenes NOT DETECTED NOT DETECTED Final   Klebsiella oxytoca NOT DETECTED NOT DETECTED Final   Klebsiella pneumoniae NOT DETECTED NOT DETECTED Final   Proteus species NOT DETECTED NOT DETECTED Final   Salmonella species NOT DETECTED NOT DETECTED Final   Serratia marcescens NOT DETECTED NOT DETECTED Final   Haemophilus influenzae NOT DETECTED NOT DETECTED Final   Neisseria meningitidis NOT DETECTED NOT DETECTED Final   Pseudomonas aeruginosa NOT DETECTED NOT DETECTED Final   Stenotrophomonas maltophilia NOT DETECTED NOT DETECTED Final   Candida albicans NOT DETECTED NOT DETECTED Final   Candida auris NOT DETECTED NOT DETECTED Final   Candida glabrata NOT DETECTED NOT DETECTED Final   Candida krusei NOT DETECTED NOT DETECTED Final   Candida parapsilosis NOT DETECTED NOT DETECTED Final   Candida tropicalis NOT DETECTED NOT DETECTED Final   Cryptococcus neoformans/gattii NOT DETECTED NOT DETECTED Final   CTX-M ESBL NOT DETECTED NOT DETECTED Final   Carbapenem resistance IMP NOT DETECTED NOT DETECTED Final   Carbapenem resistance KPC NOT DETECTED NOT DETECTED Final   Carbapenem resistance NDM NOT DETECTED NOT DETECTED Final   Carbapenem resist OXA 48 LIKE NOT DETECTED NOT DETECTED Final   Carbapenem resistance VIM NOT DETECTED NOT DETECTED Final    Comment: Performed at [facility], [address] [place], [place] [address]  Culture, blood (Routine X 2) w Reflex to ID Panel     Status: None (Preliminary result)   Collection Time: 08/28/21  3:54 AM   Specimen: BLOOD  Result Value Ref Range Status   Specimen Description BLOOD  Final   Special Requests   Final    BOTTLES DRAWN AEROBIC AND ANAEROBIC Blood Culture adequate volume   Culture   Final    NO GROWTH 2 DAYS Performed at [facility], [address] [place], [place] [address]    Report Status PENDING  Incomplete  MRSA Next Gen by PCR, Nasal     Status: None   Collection Time: 08/29/21 10:42 AM   Specimen: Nasal Mucosa; Nasal Swab  Result Value Ref Range Status   MRSA by PCR Next Gen NOT DETECTED NOT DETECTED Final    Comment: (NOTE) The GeneXpert MRSA Assay (FDA approved for NASAL specimens only), is one component of a comprehensive MRSA colonization surveillance program. It is not intended to diagnose MRSA infection nor to  guide or monitor treatment for MRSA infections. Test performance is not FDA approved in patients less than 81 years old. Performed at [facility], [address] [place], [place] [address]          Radiology Studies: CT HEAD WO CONTRAST (5MM)  Result Date: 08/29/2021 CLINICAL DATA:  Head trauma. EXAM: CT HEAD WITHOUT CONTRAST TECHNIQUE: Contiguous axial images were obtained from the base of the skull through the vertex without intravenous contrast. COMPARISON:  Head CT dated 08/28/2021. FINDINGS: Brain: Mild age-related atrophy and chronic microvascular ischemic changes. There is no acute intracranial hemorrhage. No mass effect or midline shift. No extra-axial fluid collection. Vascular: No hyperdense vessel or unexpected calcification. Skull: Normal. Negative for fracture or focal lesion. Sinuses/Orbits: Mild mucoperiosteal thickening of paranasal sinuses. No air-fluid level. Mastoid air cells are clear. Other: None IMPRESSION: 1. No acute intracranial pathology. 2. Mild age-related atrophy and chronic microvascular ischemic changes. Electronically Signed   By: Anner Crete M.D.   On: 08/29/2021 03:38   NM Pulmonary Perfusion  Result Date: 08/28/2021 CLINICAL DATA:  80 year old male with abnormal D-dimer. EXAM: NUCLEAR MEDICINE PERFUSION LUNG SCAN TECHNIQUE: Perfusion images were obtained in multiple projections after intravenous injection of radiopharmaceutical. Ventilation scans  intentionally deferred if perfusion scan and chest x-ray adequate for interpretation during COVID 19 epidemic. RADIOPHARMACEUTICALS:  4.1 mCi Tc-79mMAA IV COMPARISON:  Noncontrast chest CT 0256 hours today, and portable chest 0020 hours. FINDINGS: Good radiotracer activity in both lungs. In most areas perfusion activity appears homogeneous. However, there is a small perfusion defect in the periphery of the mid right lung. And in this region on the earlier CT is chronic lung disease with emphysema and scarring. Similar small defect at the anterior left lung is felt related to mediastinal lipomatosis as seen on the earlier CT. No other convincing perfusion defect. IMPRESSION: Pulmonary embolus Unlikely. Subtle perfusion defect(s) only in areas of emphysema and lung scarring on the CT earlier today. Electronically Signed   By: HGenevie AnnM.D.   On: 08/28/2021 11:59   DG Chest Port 1 View  Result Date: 08/29/2021 CLINICAL DATA:  Altered mental status, chronic shortness of breath EXAM: PORTABLE CHEST 1 VIEW COMPARISON:  08/28/2021 FINDINGS: Transverse diameter of heart is increased. Central pulmonary vessels are slightly less prominent. Subtle increase in interstitial markings are seen in the parahilar regions and lower lung fields. There is no focal pulmonary consolidation. There is there is evidence of previous coronary bypass surgery. There is no pleural effusion or pneumothorax. IMPRESSION: Cardiomegaly. Central pulmonary vessels are less prominent. Prominence of interstitial markings in the parahilar regions and lower lung fields may suggest residual interstitial edema or interstitial pneumonitis or scarring. There is no new focal pulmonary consolidation. Electronically Signed   By: PElmer PickerM.D.   On: 08/29/2021 15:23        Scheduled Meds:  acetylcysteine  4 mL Nebulization BID   clopidogrel  75 mg Oral Daily   diltiazem  10 mg Intravenous Once   enoxaparin (LOVENOX) injection  40 mg  Subcutaneous Q24H   furosemide  20 mg Oral Daily   gabapentin  100 mg Oral TID   guaiFENesin  1,200 mg Oral BID   insulin aspart  0-5 Units Subcutaneous QHS   insulin aspart  0-9 Units Subcutaneous TID WC   ipratropium-albuterol  3 mL Nebulization Q6H   lisinopril  5 mg Oral BID   metoprolol tartrate  50 mg Oral BID   potassium chloride  40  mEq Oral Q2H   Continuous Infusions:  cefTRIAXone (ROCEPHIN)  IV 2 g (08/29/21 1704)   metronidazole 500 mg (08/30/21 0905)   sodium chloride       LOS: 2 days    Time spent: 35 minutes    Sidney Ace, MD [facility]   If 7PM-7AM, please contact night-coverage  08/30/2021, 10:23 AM  .

## 2021-08-30 NOTE — Progress Notes (Signed)
1400. Patient in bed resting at this time. PRN medications to stabilize heart rate with effectiveness. Heartrate was 150 and is in the 80's now. Patient continues on telemetry.
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
## 2021-08-30 NOTE — Plan of Care (Signed)

## 2021-08-30 NOTE — Plan of Care (Signed)
°  Problem: Education: Goal: Knowledge of General Education information will improve Description: Including pain rating scale, medication(s)/side effects and non-pharmacologic comfort measures Outcome: Progressing   Problem: Health Behavior/Discharge Planning: Goal: Ability to manage health-related needs will improve 08/30/2021 0623 by Ribay-Pagdatoon, Audrea Muscat, RN Outcome: Progressing 08/30/2021 0513 by Ribay-Pagdatoon, Audrea Muscat, RN Outcome: Progressing   Problem: Clinical Measurements: Goal: Ability to maintain clinical measurements within normal limits will improve 08/30/2021 0623 by Ribay-Pagdatoon, Audrea Muscat, RN Outcome: Progressing 08/30/2021 0513 by Ribay-Pagdatoon, Audrea Muscat, RN Outcome: Progressing   Pt is alert and disorientedx4. Pt very restless and agitated. Gets combative at times. Haldol and Geodon IM given as well. Does not keep his oxygen on although oxygen sats at high 90s% on RA but appears short of breath with activity.

## 2021-08-31 ENCOUNTER — Inpatient Hospital Stay: Payer: PPO

## 2021-08-31 ENCOUNTER — Inpatient Hospital Stay
Admit: 2021-08-31 | Discharge: 2021-08-31 | Disposition: A | Discharge status: Home / Self Care | Payer: PPO | Attending: Internal Medicine | Admitting: Internal Medicine

## 2021-08-31 DIAGNOSIS — R451 Restlessness and agitation: Secondary | ICD-10-CM | POA: Diagnosis present

## 2021-08-31 DIAGNOSIS — G9341 Metabolic encephalopathy: Secondary | ICD-10-CM

## 2021-08-31 LAB — ECHOCARDIOGRAM COMPLETE
AR max vel: 1.98 cm2
AV Area VTI: 2.81 cm2
AV Area mean vel: 2.13 cm2
AV Mean grad: 1.5 mmHg
AV Peak grad: 2.7 mmHg
Ao pk vel: 0.82 m/s
Area-P 1/2: 5.02 cm2
Height: 70 in
MV VTI: 1.9 cm2
S' Lateral: 3.8 cm
Weight: 2800 oz

## 2021-08-31 LAB — BASIC METABOLIC PANEL
Anion gap: 8 (ref 5–15)
BUN: 18 mg/dL (ref 8–23)
CO2: 25 mmol/L (ref 22–32)
Calcium: 8.9 mg/dL (ref 8.9–10.3)
Chloride: 107 mmol/L (ref 98–111)
Creatinine, Ser: 1.04 mg/dL (ref 0.61–1.24)
GFR, Estimated: >60 mL/min (ref >60)
Glucose, Bld: 199 mg/dL — ABNORMAL HIGH (ref 70–99)
Potassium: 3.5 mmol/L (ref 3.5–5.1)
Sodium: 140 mmol/L (ref 135–145)

## 2021-08-31 LAB — GLUCOSE, CAPILLARY
Glucose-Capillary: 182 mg/dL — ABNORMAL HIGH (ref 70–99)
Glucose-Capillary: 123 mg/dL — ABNORMAL HIGH (ref 70–99)
Glucose-Capillary: 210 mg/dL — ABNORMAL HIGH (ref 70–99)
Glucose-Capillary: 118 mg/dL — ABNORMAL HIGH (ref 70–99)

## 2021-08-31 LAB — HEPATIC FUNCTION PANEL
ALT: 114 U/L — ABNORMAL HIGH (ref 0–44)
AST: 63 U/L — ABNORMAL HIGH (ref 15–41)
Albumin: 3.6 g/dL (ref 3.5–5.0)
Alkaline Phosphatase: 99 U/L (ref 38–126)
Bilirubin, Direct: 0.7 mg/dL — ABNORMAL HIGH (ref 0.0–0.2)
Indirect Bilirubin: 1.3 mg/dL — ABNORMAL HIGH (ref 0.3–0.9)
Total Bilirubin: 2 mg/dL — ABNORMAL HIGH (ref 0.3–1.2)
Total Protein: 6.5 g/dL (ref 6.5–8.1)

## 2021-08-31 MED ORDER — GADOBUTROL 1 MMOL/ML IV SOLN
7.5000 mL | Freq: Once | INTRAVENOUS | Status: AC | PRN
  Administered 2021-08-31: 11:00:00 7.5 mL via INTRAVENOUS

## 2021-08-31 MED ORDER — DIAZEPAM 5 MG PO TABS
5.0000 mg | ORAL_TABLET | Freq: Once | ORAL | Status: AC
  Administered 2021-08-31: 11:00:00 5 mg via ORAL
  Filled 2021-08-31: qty 1

## 2021-08-31 MED ORDER — GUAIFENESIN-DM 100-10 MG/5ML PO SYRP: 5.0000 mL | ORAL_SOLUTION | ORAL | Status: DC | PRN

## 2021-08-31 NOTE — Progress Notes (Signed)
Patient off unit to MRI.

## 2021-08-31 NOTE — Consult Note (Addendum)
[facility] Consult   Reason for Consult:  agitation related to delirium Referring Physician:  EDP Patient Identification: Clifford Martin MRN:  [id number] Principal Diagnosis: Acute metabolic encephalopathy Diagnosis:  Principal Problem:   Acute metabolic encephalopathy Active Problems:   Agitation   Hypertension   Diabetes mellitus without complication (HCC)   AKI (acute kidney injury) ([place])   Pulmonary nodules   Cholelithiasis   Abnormal LFTs   Elevated troponin   Acute respiratory failure with hypoxia (HCC)   Severe sepsis ([place])   Sepsis ([place])   Total Time spent with patient: 1 hour  Subjective:   Clifford Martin is a 80 y.o. male patient admitted with AMS, sepsis.  HPI:  80 yo male presented with AMS related to sepsis.  Clifford Martin became agitated earlier today and given PRN agitation medications to calm.  On assessment, Clifford Martin is standing by his bed with his wife by his bedside.  Clifford Martin is pleasantly confused reporting Clifford Martin is pregnant.  Based on his sepsis diagnosis and wife's report this is not his baseline cognitively, diagnosis of delirium is appropriate.  Clifford Martin did return to bed with fatigue noted.  Encouraged him to rest and let his wife know that his unusual behaviors are related to his infection which will clear when the infection clears.  Agitation medications placed for IM, less issue with QT intervals with Haldol IM, see recommendations below.  H&P on admission per Dr Damita Dunnings: Clifford Martin is a 80 y.o. male with medical history significant for HTN, DM,  HFrEF, CAD s/p PCI and CABG who presents to the ED with generalized malaise upon awakening associated with an episode of vomiting and later on Clifford Martin was noted to be confused, trying to drink out of his phone and stating Clifford Martin was going to remove the kitchen countertops.  Clifford Martin denied abdominal pain or diarrhea.  Denied cough .fever or chills.  Patient notes that Clifford Martin has chronic shortness of breath related to his CHF and noted  that Clifford Martin has had increased dyspnea on exertion over the past several months.  Clifford Martin denies chest pain EMS found him to have O2 sats in the 80s on room air and Clifford Martin was placed on 3 L  Past Psychiatric History: none  Risk to Self:  none Risk to Others:  none currently Prior Inpatient Therapy:  none Prior Outpatient Therapy:  none  Past Medical History:  Past Medical History:  Diagnosis Date   CHF (congestive heart failure) ([place])    Diabetes mellitus without complication ([place])    Hyperlipidemia    Hypertension    History reviewed. No pertinent surgical history. Family History: History reviewed. No pertinent family history. Family Psychiatric  History: none Social History:  Social History   Substance and Sexual Activity  Alcohol Use Not Currently     Social History   Substance and Sexual Activity  Drug Use Never    Social History   Socioeconomic History   Marital status: Married    Spouse name: Not on file   Number of children: Not on file   Years of education: Not on file   Highest education level: Not on file  Occupational History   Not on file  Tobacco Use   Smoking status: Unknown    Passive exposure: Never   Smokeless tobacco: Never  Vaping Use   Vaping Use: Never used  Substance and Sexual Activity   Alcohol use: Not Currently   Drug use: Never   Sexual activity: Yes  Partners: Female  Other Topics Concern   Not on file  Social History Narrative   Not on file   Social Determinants of Health   Financial Resource Strain: Not on file  Food Insecurity: Not on file  Transportation Needs: Not on file  Physical Activity: Not on file  Stress: Not on file  Social Connections: Not on file   Additional Social History:    Allergies:   Allergies  Allergen Reactions   Ativan [Lorazepam] Other (See Comments)    Causes AMS    Labs:  Results for orders placed or performed during the hospital encounter of 08/27/21 (from the past 48 hour(s))  Glucose, capillary      Status: Abnormal   Collection Time: 08/29/21  3:39 PM  Result Value Ref Range   Glucose-Capillary 127 (H) 70 - 99 mg/dL    Comment: Glucose reference range applies only to samples taken after fasting for at least 8 hours.  Glucose, capillary     Status: Abnormal   Collection Time: 08/29/21  9:56 PM  Result Value Ref Range   Glucose-Capillary 110 (H) 70 - 99 mg/dL    Comment: Glucose reference range applies only to samples taken after fasting for at least 8 hours.  Basic metabolic panel     Status: Abnormal   Collection Time: 08/30/21  6:34 AM  Result Value Ref Range   Sodium 139 135 - 145 mmol/L   Potassium 2.9 (L) 3.5 - 5.1 mmol/L   Chloride 101 98 - 111 mmol/L   CO2 25 22 - 32 mmol/L   Glucose, Bld 141 (H) 70 - 99 mg/dL    Comment: Glucose reference range applies only to samples taken after fasting for at least 8 hours.   BUN 21 8 - 23 mg/dL   Creatinine, Ser 0.95 0.61 - 1.24 mg/dL   Calcium 9.0 8.9 - 10.3 mg/dL   GFR, Estimated >60 >60 mL/min    Comment: (NOTE) Calculated using the CKD-EPI Creatinine Equation (2021)    Anion gap 13 5 - 15    Comment: Performed at [facility], [address] [place], [place] [address]  Hepatic function panel     Status: Abnormal   Collection Time: 08/30/21  6:34 AM  Result Value Ref Range   Total Protein 6.9 6.5 - 8.1 g/dL   Albumin 3.6 3.5 - 5.0 g/dL   AST 58 (H) 15 - 41 U/L   ALT 147 (H) 0 - 44 U/L   Alkaline Phosphatase 104 38 - 126 U/L   Total Bilirubin 3.3 (H) 0.3 - 1.2 mg/dL   Bilirubin, Direct 1.2 (H) 0.0 - 0.2 mg/dL   Indirect Bilirubin 2.1 (H) 0.3 - 0.9 mg/dL    Comment: Performed at [facility], [address] [place], [place] [address]  CBC with Differential/Platelet     Status: None   Collection Time: 08/30/21  6:34 AM  Result Value Ref Range   WBC 7.3 4.0 - 10.5 K/uL   RBC 4.64 4.22 - 5.81 MIL/uL   Hemoglobin 14.2 13.0 - 17.0 g/dL   HCT 40.5 39.0 - 52.0 %   MCV 87.3 80.0 - 100.0 fL   MCH 30.6  26.0 - 34.0 pg   MCHC 35.1 30.0 - 36.0 g/dL   RDW 13.3 11.5 - 15.5 %   Platelets 153 150 - 400 K/uL   nRBC 0.0 0.0 - 0.2 %   Neutrophils Relative % 74 %   Neutro Abs 5.4 1.7 - 7.7 K/uL  Lymphocytes Relative 12 %   Lymphs Abs 0.8 0.7 - 4.0 K/uL   Monocytes Relative 13 %   Monocytes Absolute 1.0 0.1 - 1.0 K/uL   Eosinophils Relative 0 %   Eosinophils Absolute 0.0 0.0 - 0.5 K/uL   Basophils Relative 0 %   Basophils Absolute 0.0 0.0 - 0.1 K/uL   Immature Granulocytes 1 %   Abs Immature Granulocytes 0.04 0.00 - 0.07 K/uL    Comment: Performed at [facility], [address] [place], [place] [address]  Glucose, capillary     Status: Abnormal   Collection Time: 08/30/21  8:07 AM  Result Value Ref Range   Glucose-Capillary 141 (H) 70 - 99 mg/dL    Comment: Glucose reference range applies only to samples taken after fasting for at least 8 hours.  Glucose, capillary     Status: Abnormal   Collection Time: 08/30/21 11:42 AM  Result Value Ref Range   Glucose-Capillary 146 (H) 70 - 99 mg/dL    Comment: Glucose reference range applies only to samples taken after fasting for at least 8 hours.  Glucose, capillary     Status: Abnormal   Collection Time: 08/30/21  4:47 PM  Result Value Ref Range   Glucose-Capillary 147 (H) 70 - 99 mg/dL    Comment: Glucose reference range applies only to samples taken after fasting for at least 8 hours.   Comment 1 Notify RN    Comment 2 Document in Chart   Glucose, capillary     Status: Abnormal   Collection Time: 08/30/21  9:08 PM  Result Value Ref Range   Glucose-Capillary 141 (H) 70 - 99 mg/dL    Comment: Glucose reference range applies only to samples taken after fasting for at least 8 hours.  Basic metabolic panel     Status: Abnormal   Collection Time: 08/31/21  6:51 AM  Result Value Ref Range   Sodium 140 135 - 145 mmol/L   Potassium 3.5 3.5 - 5.1 mmol/L   Chloride 107 98 - 111 mmol/L   CO2 25 22 - 32 mmol/L   Glucose, Bld 199 (H) 70  - 99 mg/dL    Comment: Glucose reference range applies only to samples taken after fasting for at least 8 hours.   BUN 18 8 - 23 mg/dL   Creatinine, Ser 1.04 0.61 - 1.24 mg/dL   Calcium 8.9 8.9 - 10.3 mg/dL   GFR, Estimated >60 >60 mL/min    Comment: (NOTE) Calculated using the CKD-EPI Creatinine Equation (2021)    Anion gap 8 5 - 15    Comment: Performed at [facility], [address] [place], [place] [address]  Hepatic function panel     Status: Abnormal   Collection Time: 08/31/21  6:51 AM  Result Value Ref Range   Total Protein 6.5 6.5 - 8.1 g/dL   Albumin 3.6 3.5 - 5.0 g/dL   AST 63 (H) 15 - 41 U/L   ALT 114 (H) 0 - 44 U/L   Alkaline Phosphatase 99 38 - 126 U/L   Total Bilirubin 2.0 (H) 0.3 - 1.2 mg/dL   Bilirubin, Direct 0.7 (H) 0.0 - 0.2 mg/dL   Indirect Bilirubin 1.3 (H) 0.3 - 0.9 mg/dL    Comment: Performed at [facility] [address] [place], [place] [address]  Glucose, capillary     Status: Abnormal   Collection Time: 08/31/21  7:31 AM  Result Value Ref Range   Glucose-Capillary 182 (H) 70 -  99 mg/dL    Comment: Glucose reference range applies only to samples taken after fasting for at least 8 hours.  Glucose, capillary     Status: Abnormal   Collection Time: 08/31/21 11:18 AM  Result Value Ref Range   Glucose-Capillary 123 (H) 70 - 99 mg/dL    Comment: Glucose reference range applies only to samples taken after fasting for at least 8 hours.    Current Facility-Administered Medications  Medication Dose Route Frequency Provider Last Rate Last Admin   acetylcysteine (MUCOMYST) 20 % nebulizer / oral solution 4 mL  4 mL Nebulization BID Deal, Justice Britain, RPH   4 mL at 08/31/21 0803   cefTRIAXone (ROCEPHIN) 2 g in sodium chloride 0.9 % 100 mL IVPB  2 g Intravenous Q24H Ralene Muskrat B, MD   Stopped at 08/30/21 1723   clopidogrel (PLAVIX) tablet 75 mg  75 mg Oral Daily [place] Eric J, DO   75 mg at 08/31/21 0912   enoxaparin (LOVENOX)  injection 40 mg  40 mg Subcutaneous Q24H Ralene Muskrat B, MD   40 mg at 08/31/21 1221   furosemide (LASIX) tablet 20 mg  20 mg Oral Daily Ralene Muskrat B, MD   20 mg at 08/31/21 0912   gabapentin (NEURONTIN) capsule 100 mg  100 mg Oral TID [place] Eric J, DO   100 mg at 08/31/21 0911   guaiFENesin (MUCINEX) 12 hr tablet 1,200 mg  1,200 mg Oral BID Ralene Muskrat B, MD   1,200 mg at 08/31/21 0911   guaiFENesin-dextromethorphan (ROBITUSSIN DM) 100-10 MG/5ML syrup 5 mL  5 mL Oral Q4H PRN Ralene Muskrat B, MD       haloperidol lactate (HALDOL) injection 10 mg  10 mg Intramuscular Q6H PRN Ralene Muskrat B, MD   10 mg at 08/30/21 1735   insulin aspart (novoLOG) injection 0-5 Units  0-5 Units Subcutaneous QHS [place] Eric J, DO       insulin aspart (novoLOG) injection 0-9 Units  0-9 Units Subcutaneous TID WC [place] Donnamarie Poag, DO   1 Units at 08/31/21 1219   ipratropium-albuterol (DUONEB) 0.5-2.5 (3) MG/3ML nebulizer solution 3 mL  3 mL Nebulization BID Sreenath, Sudheer B, MD   3 mL at 08/31/21 0800   lisinopril (ZESTRIL) tablet 5 mg  5 mg Oral BID Ralene Muskrat B, MD   5 mg at 08/31/21 0911   metoprolol tartrate (LOPRESSOR) injection 5 mg  5 mg Intravenous Q6H PRN Sharion Settler, NP   5 mg at 08/30/21 2831   metoprolol tartrate (LOPRESSOR) tablet 50 mg  50 mg Oral BID Ralene Muskrat B, MD   50 mg at 08/31/21 0912   metroNIDAZOLE (FLAGYL) IVPB 500 mg  500 mg Intravenous Q12H Judd Gaudier V, MD 100 mL/hr at 08/31/21 0915 500 mg at 08/31/21 0915   ondansetron (ZOFRAN) tablet 4 mg  4 mg Oral Q6H PRN Athena Masse, MD       Or   ondansetron [facility]) injection 4 mg  4 mg Intravenous Q6H PRN Athena Masse, MD       traZODone (DESYREL) tablet 50 mg  50 mg Oral QHS PRN Sidney Ace, MD        Musculoskeletal: Strength & Muscle Tone: within normal limits Gait & Station: normal Patient leans: N/A   Psychiatric Specialty Exam: Physical Exam Vitals and nursing note  reviewed.  Constitutional:      Appearance: Normal appearance.  HENT:     Head: Normocephalic.     Nose:  Nose normal.  Pulmonary:     Effort: Pulmonary effort is normal.  Musculoskeletal:        General: Normal range of motion.     Cervical back: Normal range of motion.  Neurological:     General: No focal deficit present.     Mental Status: Clifford Martin is alert.  Psychiatric:        Attention and Perception: Attention and perception normal.        Mood and Affect: Mood is anxious.        Speech: Speech normal.        Behavior: Behavior normal. Behavior is cooperative.        Thought Content: Thought content is delusional.        Cognition and Memory: Cognition is impaired.        Judgment: Judgment is impulsive.    Review of Systems  Constitutional:  Positive for malaise/fatigue.  Psychiatric/Behavioral:  The patient is nervous/anxious.   All other systems reviewed and are negative.  Blood pressure (!) 146/84, pulse 95, temperature 98.2 F (36.8 C), temperature source Oral, resp. rate 19, height 5\' 10"  (1.778 m), weight 79.4 kg, SpO2 93 %.Body mass index is 25.11 kg/m.  General Appearance: Casual  Eye Contact:  Good  Speech:  Normal Rate  Volume:  Normal  Mood:  Anxious  Affect:  Congruent  Thought Process:  Irrelevant  Orientation:  Other:  person  Thought Content:  Delusions  Suicidal Thoughts:  No  Homicidal Thoughts:  No  Memory:  Immediate;   Poor Recent;   Poor Remote;   Poor  Judgement:  Impaired  Insight:  Lacking  Psychomotor Activity:  Normal  Concentration:  Concentration: Fair and Attention Span: Fair  Recall:  Poor  Fund of Knowledge:  Fair  Language:  Good  Akathisia:  No  Handed:  Right  AIMS (if indicated):     Assets:  Leisure Time Resilience Social Support  ADL's:  Impaired  Cognition:  Impaired,  Moderate  Sleep:        Physical Exam: Physical Exam Vitals and nursing note reviewed.  Constitutional:      Appearance: Normal appearance.   HENT:     Head: Normocephalic.     Nose: Nose normal.  Pulmonary:     Effort: Pulmonary effort is normal.  Musculoskeletal:        General: Normal range of motion.     Cervical back: Normal range of motion.  Neurological:     General: No focal deficit present.     Mental Status: Clifford Martin is alert.  Psychiatric:        Attention and Perception: Attention and perception normal.        Mood and Affect: Mood is anxious.        Speech: Speech normal.        Behavior: Behavior normal. Behavior is cooperative.        Thought Content: Thought content is delusional.        Cognition and Memory: Cognition is impaired.        Judgment: Judgment is impulsive.   Review of Systems  Constitutional:  Positive for malaise/fatigue.  Psychiatric/Behavioral:  The patient is nervous/anxious.   All other systems reviewed and are negative. Blood pressure (!) 146/84, pulse 95, temperature 98.2 F (36.8 C), temperature source Oral, resp. rate 19, height 5\' 10"  (1.778 m), weight 79.4 kg, SpO2 93 %. Body mass index is 25.11 kg/m.  Treatment Plan Summary: Agitation:  Recommend  Haldol 5 mg and Benadryl 50 mg for severe agitation   Patient likely has ongoing delirium which presents as fluctuating cognition.   The patients exam is notable for altered sensorium, perceptual disturbances, disorientation and cognitive deficits that appear markedly different than their baseline, suggesting a diagnosis of delirium.  Virtually any medical condition or physiologic stress can precipitate delirium in a susceptible individual, with risk increasing in those with: advanced age, sensory impairments, organic brain disease (stroke, dementia, Parkinsons), psychiatric illness, major chronic medical issues, prolonged hospitalizations, postoperative status, anemia, insomnia/disturbed sleep, and severe pain. Addressing the underlying medical condition and institution of preventative measures are recommended.  - Continue to monitor and  treat underlying medical causes of delirium, including infection, electrolyte disturbances, etc. - Delirium precautions - Minimize/avoid deliriogenic meds including: anticholinergic, opiates, benzodiazepines           - Maintain hydration, oxygenation, nutrition           - Limit use of restraints and catheters           - Normalize sleep patterns by minimizing nighttime noise, light and interruptions by     -Ensure sleep apnea treatment is provided overnight.             clustering care, opening blinds during the day           - Reorient the patient frequently, provide easily visible clock and calendar           - Provide sensory aids like glasses, hearing aids           - Encourage ambulation, regular activities and visitors to maintain cognitive stimulation   -Patient would benefit from having family members at bedside to reinforce his orientation.   Disposition: No evidence of imminent risk to self or others at present.   Patient does not meet criteria for psychiatric inpatient admission. Supportive therapy provided about ongoing stressors.  Waylan Boga, NP 08/31/2021 1:32 PM
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
## 2021-08-31 NOTE — Consult Note (Signed)
This provider went to evaluate for agitation, he is in a "procedure".  Removed Geodon IM every 2 hours as the limit for IM is 40 mg related to QT prolongation.  Recommend Haldol 5 mg, Ativan 2 mg, and Benadryl 50 mg PRN agitation.  Waylan Boga, PMHNP

## 2021-08-31 NOTE — Progress Notes (Signed)
*  PRELIMINARY RESULTS* Echocardiogram 2D Echocardiogram has been performed.  Sherrie Sport 08/31/2021, 12:23 PM

## 2021-08-31 NOTE — Progress Notes (Signed)
Writer in room with patient. No behaviors noted at this time. Some confusion noted. Patient able to be redirected easily.

## 2021-08-31 NOTE — Progress Notes (Signed)
PROGRESS NOTE    Clifford Martin  QHU:[id number] DOB: 1941-01-07 DOA: 08/27/2021 PCP: [place]    Brief Narrative:  80 year old male with past medical history significant for essential hypertension, type 2 diabetes mellitus, chronic systolic congestive heart failure, CAD s/p PCI/CABG who presented to [facility] ED on 12/22 via EMS with confusion.  Wife contributed to HPI and states patient was found trying to clean out the kitchen and remove the countertops.  Patient did report discolored urine recently.  He reported history of chronic shortness of breath related to his CHF. Patient denied cough, no fever/chills, no abdominal pain, no diarrhea.  On EMS arrival, patient was found to be hypoxic with SPO2 in the high 80s and placed on supplemental oxygen.   In the ED, temperature 100.1 F, HR 91, RR 27, BP 99/52, SPO2 94% on 3 L nasal cannula.  WBC 7.6, hemoglobin 13.3, platelets 119.  Sodium 136, potassium 3.8, chloride 101, CO2 27, glucose 117, BUN 29, creatinine 2.12, AST 259, ALT 358, total bilirubin 7.1.  BNP 459.6.  Lactic acid 1.0, procalcitonin 5.73.  High sensitive troponin 280> 508> 390.  COVID-19 PCR negative.  Influenza A/B PCR negative.  Urinalysis with amber-colored urine, 100 glucose, large bilirubin, 100 protein; no bacteria and 11-20 WBCs.  EtOH level less than 10.  Acetaminophen level less than 10.  Chest x-ray with mild vascular congestion without edema.  CT head without contrast with mild atrophic changes without acute abnormality.  CT chest/abdomen/pelvis with no acute intrathoracic or intra-abdominal pathology, notable for moderate emphysema with multiple pulmonary nodules most severe 4 mm right solid pulmonary nodule and No intra/extrahepatic biliary ductal dilation.  Given patient's confusion, hypertension, low-grade fever; patient was started on empiric antibiotics for sepsis of unclear etiology.  Hospitalist service consulted for further evaluation  management.  Patient has been intermittently agitated.  Benzodiazepines seem to have a paradoxical effect.  Responds appropriately to administration of intramuscular antipsychotics.   Assessment & Plan:   Principal Problem:   Acute metabolic encephalopathy Active Problems:   Hypertension   Diabetes mellitus without complication (HCC)   AKI (acute kidney injury) ([place])   Pulmonary nodules   Cholelithiasis   Abnormal LFTs   Elevated troponin   Acute respiratory failure with hypoxia (HCC)   Severe sepsis (HCC)   Sepsis (HCC)  Acute metabolic encephalopathy, POA: Improving Patient presenting with confusion, normally alert and oriented per family.  No leukocytosis, lactic acid within normal limits.  Elevated procalcitonin level.  Unclear etiology but suspicion for infectious process versus hypotension versus toxic effects with liver failure. Encephalopathy has been waxing and waning UDS negative, ammonia 42 Suspect delirium superimposed on underlying cognitive decline in the setting of acute infection -12/25: Patient became acutely agitated and confused.  Violent activities towards hospital staff. Plan: Psychiatry consultation requested.  Patient was at MRI at that time.  Benzodiazepines had paradoxical effect and worsen agitation.  Limit use.  Agitation is improved currently.  We will continue to monitor.  Discussed with wife.  Frequent orienting measures.  Awake during day of sleep at night.  Severe sepsis, POA Patient met sepsis criteria on admission with tachypnea, tachycardia, fever, elevated procalcitonin in the setting of end organ damage with acute renal failure, acute liver failure, acute metabolic encephalopathy and MAP <70.   Chest x-ray with mild pulmonary vascular congestion, otherwise unrevealing.   CT chest/abdomen/pelvis with no acute intrathoracic or intra-abdominal findings. Renal and hepatic indices appear to be improving BCID 1/4 E. coli,  no  resistance Plan: Continue Rocephin Continue Flagyl Vancomycin discontinued Monitor vitals and fever curve MRCP abdomen rule out biliary pathology, pending.  Could not be done due to agitation.  We will attempt to   Hypotension Hx hypertension Patient was notably hypotensive on presentation.   Unclear etiology, sepsis as above versus overly controlled BP. Blood pressure improving Plan: Continue home amlodipine and metoprolol  New onset tachycardia Suspected new onset atrial fibrillation versus sinus tachycardia Plan: Continue metoprolol 50 mg twice daily per home dose As needed IV metoprolol or Cardizem for sustained tachycardia Telemetry monitoring Twelve-lead EKG as needed chest pain/tachycardia TTE, pending   Elevated troponin likely secondary to type II demand ischemia High sensitive troponin 280>508>390.   EKG with normal sinus rhythm without dynamic changes.   Denies chest pain.  Initially was started on a heparin drip, currently discontinued  Suspect supply demand ischemia  Plan:  Telemetry monitoring  EKG as needed chest pain    Acute liver injury with elevated bilirubin Patient presenting with acute confusion as above.   Was noted to have elevated LFTs on presentation with elevated total bilirubin.   CT abdomen/pelvis with no intrahepatic extrahepatic biliary ductal dilation and right upper quadrant ultrasound unrevealing.   EtOH level less than 10, Tylenol level less than 10.   Unclear etiology but suspect possible mild shock liver from hypotension. LFTs downtrending --AST 259>237>160>88 --ALT 358>340>264>190 --Tbili 7.1>6.5>5.6>3.8 --Acute hepatitis panel negative. Ammonia level 42 Plan: Avoid hepatotoxins Continue holding statin Follow-up MRCP Daily LFTs  Acute renal failure, improved Baseline creatinine 0.98 on 04/22/2021.   Creatinine elevated 2.12 on presentation.  Suspect prerenal azotemia from dehydration versus ATN from hypotension. --Cr  2.12>2.03>0.99 Plan: Continue home lisinopril Avoid nonessential nephrotoxins Monitor BMP    Elevated D-dimer Patient was found hypoxic by EMS and placed on supplemental oxygen. --Nuclear medicine VQ scan: Negative, low suspicion for PE   Chronic systolic/diastolic congestive heart failure Follows [facility]  Recent TTE 04/21/2021 with LVEF 40-45%, LV mildly dilated, LV systolic function moderately decreased, grade 2 diastolic dysfunction, moderate MR, LA/RA mildly dilated, RV mildly dilated.  Home medication regimen includes metoprolol tartrate 50 mg p.o. twice daily, lisinopril 5 mg p.o. twice daily, furosemide 20 mg p.o. daily.  Chest x-ray on admission with mild pulmonary edema and was notably hypotensive. Plan: -- Continue home antihypertensives -Continue home diuretics --Strict ins and outs, daily weights   Hyperlipidemia: Atorvastatin 40 mg p.o. daily, on hold   Type 2 diabetes mellitus On metformin outpatient. --SSI for coverage --CBGs qAC/HS   CAD s/p PCI/CABG --Continue Plavix  -- Holding statin as above due to acute liver injury   DVT prophylaxis: SQ Lovenox Code Status: Full Family Communication: spouse Jovann Luse [phone] on 12/26 Disposition Plan: Status is: Inpatient  Remains inpatient appropriate because: Sepsis, acute metabolic encephalopathy.  Possible discharge in 24 to 48 hours if clinical improvement continues      Level of care: Med-Surg  Consultants:  None  Procedures:  None  Antimicrobials: Ceftriaxone Metronidazole   Subjective: Seen and examined.  Wife bedside.  Much more alert and awake this morning.  Answers questions appropriately.  Objective: Vitals:   08/30/21 2031 08/30/21 2110 08/31/21 0734 08/31/21 0803  BP:  (!) 122/54 (!) 146/84   Pulse:  (!) 108 95   Resp:      Temp:  (!) 97.5 F (36.4 C) 98.2 F (36.8 C)   TempSrc:  Oral Oral   SpO2: 96% 95% 93% 93%  Weight:  Height:         Intake/Output Summary (Last 24 hours) at 08/31/2021 1044 Last data filed at 08/30/2021 2120 Gross per 24 hour  Intake 560 ml  Output --  Net 560 ml   Filed Weights   08/28/21 0302  Weight: 79.4 kg    Examination:  General exam: Resting in bed.  No apparent distress Respiratory system: Lungs clear.  Normal work of breathing. Cardiovascular system: S1-S2, tachycardic, regular rhythm, no murmurs, no pedal edema Gastrointestinal system: Soft, NT/ND, normal bowel sounds Central nervous system: Lethargic.  Oriented x2. Extremities: Unable to assess power.  No BLE edema Skin: No rashes, lesions or ulcers Psychiatry: Judgement and insight appear impaired. Mood & affect flattened.     Data Reviewed: I have personally reviewed following labs and imaging studies  CBC: Recent Labs  Lab 08/27/21 2355 08/28/21 0522 08/29/21 0716 08/29/21 1159 08/30/21 0634  WBC 7.6 7.3 5.8 5.1 7.3  NEUTROABS 6.5  --   --   --  5.4  HGB 12.3* 12.3* 12.7* 13.0 14.2  HCT 36.7* 35.9* 36.5* 37.9* 40.5  MCV 92.7 91.6 88.8 88.1 87.3  PLT 119* 120* 138* 137* 299   Basic Metabolic Panel: Recent Labs  Lab 08/28/21 0101 08/28/21 0145 08/29/21 0716 08/29/21 1159 08/30/21 0634 08/31/21 0651  NA 136 137 140  --  139 140  K 3.8 4.1 3.6  --  2.9* 3.5  CL 101 102 105  --  101 107  CO2 _0 --  25 25  GLUCOSE 117* 113* 131*  --  141* 199*  BUN 29* 27* 21  --  21 18  CREATININE 2.12* 2.03* 0.99 0.91 0.95 1.04  CALCIUM 8.4* 8.4* 8.6*  --  9.0 8.9   GFR: Estimated Creatinine Clearance: 60.4 mL/min (by C-G formula based on SCr of 1.04 mg/dL). Liver Function Tests: Recent Labs  Lab 08/28/21 0145 08/28/21 0954 08/29/21 0716 08/30/21 0634 08/31/21 0651  AST 237* 160* 88* 58* 63*  ALT 340* 264* 190* 147* 114*  ALKPHOS 124 114 109 104 99  BILITOT 6.5* 5.6* 3.8* 3.3* 2.0*  PROT 6.1* 5.8* 6.3* 6.9 6.5  ALBUMIN 3.5 3.2* 3.4* 3.6 3.6   Recent Labs  Lab 08/28/21 0351  LIPASE 25   Recent  Labs  Lab 08/28/21 0954  AMMONIA 42*   Coagulation Profile: Recent Labs  Lab 08/28/21 0632 08/29/21 0716  INR 1.2 1.1   Cardiac Enzymes: No results for input(s): CKTOTAL, CKMB, CKMBINDEX, TROPONINI in the last 168 hours. BNP (last 3 results) No results for input(s): PROBNP in the last 8760 hours. HbA1C: No results for input(s): HGBA1C in the last 72 hours. CBG: Recent Labs  Lab 08/30/21 0807 08/30/21 1142 08/30/21 1647 08/30/21 2108 08/31/21 0731  GLUCAP 141* 146* 147* 141* 182*   Lipid Profile: No results for input(s): CHOL, HDL, LDLCALC, TRIG, CHOLHDL, LDLDIRECT in the last 72 hours. Thyroid Function Tests: No results for input(s): TSH, T4TOTAL, FREET4, T3FREE, THYROIDAB in the last 72 hours.  Anemia Panel: No results for input(s): VITAMINB12, FOLATE, FERRITIN, TIBC, IRON, RETICCTPCT in the last 72 hours. Sepsis Labs: Recent Labs  Lab 08/28/21 0351 08/29/21 0716  PROCALCITON 5.73 1.82  LATICACIDVEN 1.0  --     Recent Results (from the past 240 hour(s))  Resp Panel by RT-PCR (Flu A&B, Covid) Nasopharyngeal Swab     Status: None   Collection Time: 08/28/21 12:20 AM   Specimen: Nasopharyngeal Swab; Nasopharyngeal(NP) swabs in vial transport medium  Result  Value Ref Range Status   SARS Coronavirus 2 by RT PCR NEGATIVE NEGATIVE Final    Comment: (NOTE) SARS-CoV-2 target nucleic acids are NOT DETECTED.  The SARS-CoV-2 RNA is generally detectable in upper respiratory specimens during the acute phase of infection. The lowest concentration of SARS-CoV-2 viral copies this assay can detect is 138 copies/mL. A negative result does not preclude SARS-Cov-2 infection and should not be used as the sole basis for treatment or other patient management decisions. A negative result may occur with  improper specimen collection/handling, submission of specimen other than nasopharyngeal swab, presence of viral mutation(s) within the areas targeted by this assay, and inadequate  number of viral copies(<138 copies/mL). A negative result must be combined with clinical observations, patient history, and epidemiological information. The expected result is Negative.  Fact Sheet for Patients:  EntrepreneurPulse.com.au  Fact Sheet for Healthcare Providers:  IncredibleEmployment.be  This test is no t yet approved or cleared by the [place] FDA and  has been authorized for detection and/or diagnosis of SARS-CoV-2 by FDA under an Emergency Use Authorization (EUA). This EUA will remain  in effect (meaning this test can be used) for the duration of the COVID-19 declaration under Section 564(b)(1) of the Act, 21 U.S.C.section 360bbb-3(b)(1), unless the authorization is terminated  or revoked sooner.       Influenza A by PCR NEGATIVE NEGATIVE Final   Influenza B by PCR NEGATIVE NEGATIVE Final    Comment: (NOTE) The Xpert Xpress SARS-CoV-2/FLU/RSV plus assay is intended as an aid in the diagnosis of influenza from Nasopharyngeal swab specimens and should not be used as a sole basis for treatment. Nasal washings and aspirates are unacceptable for Xpert Xpress SARS-CoV-2/FLU/RSV testing.  Fact Sheet for Patients: EntrepreneurPulse.com.au  Fact Sheet for Healthcare Providers: IncredibleEmployment.be  This test is not yet approved or cleared by the [place] FDA and has been authorized for detection and/or diagnosis of SARS-CoV-2 by FDA under an Emergency Use Authorization (EUA). This EUA will remain in effect (meaning this test can be used) for the duration of the COVID-19 declaration under Section 564(b)(1) of the Act, 21 U.S.C. section 360bbb-3(b)(1), unless the authorization is terminated or revoked.  Performed at [facility], [address] [place], [place] [address]   Urine Culture     Status: Abnormal   Collection Time: 08/28/21 12:20 AM   Specimen: Urine, Random   Result Value Ref Range Status   Specimen Description   Final    URINE, RANDOM Performed at [facility], [address] [place], [place] [address]    Special Requests   Final    NONE Performed at [facility], [address] [place], [place] [address]    Culture (A)  Final    <10,000 COLONIES/mL INSIGNIFICANT GROWTH Performed at [facility], [place] [address] [place], [place] [address]    Report Status 08/29/2021 FINAL  Final  Culture, blood (Routine X 2) w Reflex to ID Panel     Status: Abnormal (Preliminary result)   Collection Time: 08/28/21  3:51 AM   Specimen: BLOOD  Result Value Ref Range Status   Specimen Description   Final    BLOOD Performed at [facility], [address], [place], [place] [address]    Special Requests   Final    BOTTLES DRAWN AEROBIC AND ANAEROBIC Blood Culture adequate volume Performed at [facility], [address] [place], [place] [address]    Culture  Setup Time  Final    AEROBIC BOTTLE ONLY GRAM NEGATIVE RODS Organism ID to follow CRITICAL RESULT CALLED TO, READ BACK BY AND VERIFIED WITH: SUSAN WATSON _0  08/29/21 MJU Performed at [facility], [address] [place], [place] [address]    Culture (A)  Final    ESCHERICHIA COLI SUSCEPTIBILITIES TO FOLLOW Performed at [facility], [place] [address], [place], [place] [address]    Report Status PENDING  Incomplete  Blood Culture ID Panel (Reflexed)     Status: Abnormal   Collection Time: 08/28/21  3:51 AM  Result Value Ref Range Status   Enterococcus faecalis NOT DETECTED NOT DETECTED Final   Enterococcus Faecium NOT DETECTED NOT DETECTED Final   Listeria monocytogenes NOT DETECTED NOT DETECTED Final   Staphylococcus species NOT DETECTED NOT DETECTED Final   Staphylococcus aureus (BCID) NOT DETECTED NOT DETECTED Final   Staphylococcus epidermidis NOT DETECTED NOT DETECTED Final   Staphylococcus lugdunensis NOT DETECTED NOT  DETECTED Final   Streptococcus species NOT DETECTED NOT DETECTED Final   Streptococcus agalactiae NOT DETECTED NOT DETECTED Final   Streptococcus pneumoniae NOT DETECTED NOT DETECTED Final   Streptococcus pyogenes NOT DETECTED NOT DETECTED Final   A.calcoaceticus-baumannii NOT DETECTED NOT DETECTED Final   Bacteroides fragilis NOT DETECTED NOT DETECTED Final   Enterobacterales DETECTED (A) NOT DETECTED Final    Comment: Enterobacterales represent a large order of gram negative bacteria, not a single organism. CRITICAL RESULT CALLED TO, READ BACK BY AND VERIFIED WITH: SUSAN WATSON _1  08/29/21 MJU    Enterobacter cloacae complex NOT DETECTED NOT DETECTED Final   Escherichia coli DETECTED (A) NOT DETECTED Final    Comment: CRITICAL RESULT CALLED TO, READ BACK BY AND VERIFIED WITH: SUSAN WATSON _2  08/29/21 MJU    Klebsiella aerogenes NOT DETECTED NOT DETECTED Final   Klebsiella oxytoca NOT DETECTED NOT DETECTED Final   Klebsiella pneumoniae NOT DETECTED NOT DETECTED Final   Proteus species NOT DETECTED NOT DETECTED Final   Salmonella species NOT DETECTED NOT DETECTED Final   Serratia marcescens NOT DETECTED NOT DETECTED Final   Haemophilus influenzae NOT DETECTED NOT DETECTED Final   Neisseria meningitidis NOT DETECTED NOT DETECTED Final   Pseudomonas aeruginosa NOT DETECTED NOT DETECTED Final   Stenotrophomonas maltophilia NOT DETECTED NOT DETECTED Final   Candida albicans NOT DETECTED NOT DETECTED Final   Candida auris NOT DETECTED NOT DETECTED Final   Candida glabrata NOT DETECTED NOT DETECTED Final   Candida krusei NOT DETECTED NOT DETECTED Final   Candida parapsilosis NOT DETECTED NOT DETECTED Final   Candida tropicalis NOT DETECTED NOT DETECTED Final   Cryptococcus neoformans/gattii NOT DETECTED NOT DETECTED Final   CTX-M ESBL NOT DETECTED NOT DETECTED Final   Carbapenem resistance IMP NOT DETECTED NOT DETECTED Final   Carbapenem resistance KPC NOT DETECTED NOT DETECTED  Final   Carbapenem resistance NDM NOT DETECTED NOT DETECTED Final   Carbapenem resist OXA 48 LIKE NOT DETECTED NOT DETECTED Final   Carbapenem resistance VIM NOT DETECTED NOT DETECTED Final    Comment: Performed at [facility], [address] [place], [place] [address]  Culture, blood (Routine X 2) w Reflex to ID Panel     Status: None (Preliminary result)   Collection Time: 08/28/21  3:54 AM   Specimen: BLOOD  Result Value Ref Range Status   Specimen Description BLOOD  Final   Special Requests   Final    BOTTLES DRAWN AEROBIC AND ANAEROBIC Blood Culture adequate volume   Culture  Final    NO GROWTH 3 DAYS Performed at [facility], [address] [place], [place] [address]    Report Status PENDING  Incomplete  MRSA Next Gen by PCR, Nasal     Status: None   Collection Time: 08/29/21 10:42 AM   Specimen: Nasal Mucosa; Nasal Swab  Result Value Ref Range Status   MRSA by PCR Next Gen NOT DETECTED NOT DETECTED Final    Comment: (NOTE) The GeneXpert MRSA Assay (FDA approved for NASAL specimens only), is one component of a comprehensive MRSA colonization surveillance program. It is not intended to diagnose MRSA infection nor to guide or monitor treatment for MRSA infections. Test performance is not FDA approved in patients less than 61 years old. Performed at [facility], [address], [place], [place] [address]          Radiology Studies: [facility] 1 View  Result Date: 08/29/2021 CLINICAL DATA:  Altered mental status, chronic shortness of breath EXAM: PORTABLE CHEST 1 VIEW COMPARISON:  08/28/2021 FINDINGS: Transverse diameter of heart is increased. Central pulmonary vessels are slightly less prominent. Subtle increase in interstitial markings are seen in the parahilar regions and lower lung fields. There is no focal pulmonary consolidation. There is there is evidence of previous coronary bypass surgery. There is no pleural effusion or  pneumothorax. IMPRESSION: Cardiomegaly. Central pulmonary vessels are less prominent. Prominence of interstitial markings in the parahilar regions and lower lung fields may suggest residual interstitial edema or interstitial pneumonitis or scarring. There is no new focal pulmonary consolidation. Electronically Signed   By: Elmer Picker M.D.   On: 08/29/2021 15:23        Scheduled Meds:  acetylcysteine  4 mL Nebulization BID   clopidogrel  75 mg Oral Daily   diazepam  5 mg Oral Once   enoxaparin (LOVENOX) injection  40 mg Subcutaneous Q24H   furosemide  20 mg Oral Daily   gabapentin  100 mg Oral TID   guaiFENesin  1,200 mg Oral BID   insulin aspart  0-5 Units Subcutaneous QHS   insulin aspart  0-9 Units Subcutaneous TID WC   ipratropium-albuterol  3 mL Nebulization BID   lisinopril  5 mg Oral BID   metoprolol tartrate  50 mg Oral BID   Continuous Infusions:  cefTRIAXone (ROCEPHIN)  IV Stopped (08/30/21 1723)   metronidazole 500 mg (08/31/21 0915)     LOS: 3 days    Time spent: 35 minutes    Sidney Ace, MD [facility]   If 7PM-7AM, please contact night-coverage  08/31/2021, 10:44 AM  .

## 2021-08-31 NOTE — Progress Notes (Signed)
Patient in bed resting with call bell within reach. Sitter at bedside.

## 2021-09-01 LAB — CBC WITH DIFFERENTIAL/PLATELET
Abs Immature Granulocytes: 0.02 10*3/uL (ref 0.00–0.07)
Basophils Absolute: 0 10*3/uL (ref 0.0–0.1)
Basophils Relative: 0 %
Eosinophils Absolute: 0.4 10*3/uL (ref 0.0–0.5)
Eosinophils Relative: 4 %
HCT: 40.5 % (ref 39.0–52.0)
Hemoglobin: 13.7 g/dL (ref 13.0–17.0)
Immature Granulocytes: 0 %
Lymphocytes Relative: 20 %
Lymphs Abs: 1.9 10*3/uL (ref 0.7–4.0)
MCH: 30 pg (ref 26.0–34.0)
MCHC: 33.8 g/dL (ref 30.0–36.0)
MCV: 88.6 fL (ref 80.0–100.0)
Monocytes Absolute: 0.9 10*3/uL (ref 0.1–1.0)
Monocytes Relative: 9 %
Neutro Abs: 6.3 10*3/uL (ref 1.7–7.7)
Neutrophils Relative %: 67 %
Platelets: 175 10*3/uL (ref 150–400)
RBC: 4.57 MIL/uL (ref 4.22–5.81)
RDW: 13.6 % (ref 11.5–15.5)
WBC: 9.6 10*3/uL (ref 4.0–10.5)
nRBC: 0 % (ref 0.0–0.2)

## 2021-09-01 LAB — MAGNESIUM: Magnesium: 1.3 mg/dL — ABNORMAL LOW (ref 1.7–2.4)

## 2021-09-01 LAB — CULTURE, BLOOD (ROUTINE X 2): Special Requests: ADEQUATE

## 2021-09-01 LAB — COMPREHENSIVE METABOLIC PANEL
ALT: 94 U/L — ABNORMAL HIGH (ref 0–44)
AST: 55 U/L — ABNORMAL HIGH (ref 15–41)
Albumin: 3.7 g/dL (ref 3.5–5.0)
Alkaline Phosphatase: 94 U/L (ref 38–126)
Anion gap: 9 (ref 5–15)
BUN: 19 mg/dL (ref 8–23)
CO2: 27 mmol/L (ref 22–32)
Calcium: 8.9 mg/dL (ref 8.9–10.3)
Chloride: 105 mmol/L (ref 98–111)
Creatinine, Ser: 1.06 mg/dL (ref 0.61–1.24)
GFR, Estimated: >60 mL/min (ref >60)
Glucose, Bld: 134 mg/dL — ABNORMAL HIGH (ref 70–99)
Potassium: 3.6 mmol/L (ref 3.5–5.1)
Sodium: 141 mmol/L (ref 135–145)
Total Bilirubin: 1.8 mg/dL — ABNORMAL HIGH (ref 0.3–1.2)
Total Protein: 6.5 g/dL (ref 6.5–8.1)

## 2021-09-01 LAB — GLUCOSE, CAPILLARY
Glucose-Capillary: 131 mg/dL — ABNORMAL HIGH (ref 70–99)
Glucose-Capillary: 182 mg/dL — ABNORMAL HIGH (ref 70–99)

## 2021-09-01 LAB — HEMOGLOBIN A1C
Hgb A1c MFr Bld: 5.8 % — ABNORMAL HIGH (ref 4.8–5.6)
Mean Plasma Glucose: 120 mg/dL

## 2021-09-01 MED ORDER — CIPROFLOXACIN HCL 500 MG PO TABS
500.0000 mg | ORAL_TABLET | Freq: Two times a day (BID) | ORAL | 0 refills | Status: AC
Start: 2021-09-01 — End: 2021-09-06

## 2021-09-01 MED ORDER — CIPROFLOXACIN HCL 500 MG PO TABS
500.0000 mg | ORAL_TABLET | Freq: Two times a day (BID) | ORAL | Status: DC
  Administered 2021-09-01: 12:00:00 500 mg via ORAL
  Filled 2021-09-01: qty 1

## 2021-09-01 MED ORDER — METRONIDAZOLE 500 MG PO TABS: 500.0000 mg | ORAL_TABLET | Freq: Two times a day (BID) | ORAL | 0 refills | Status: AC

## 2021-09-01 MED ORDER — METRONIDAZOLE 500 MG PO TABS
500.0000 mg | ORAL_TABLET | Freq: Two times a day (BID) | ORAL | Status: DC
  Filled 2021-09-01: qty 1

## 2021-09-01 NOTE — Discharge Summary (Signed)
Physician Discharge Summary  JACODY BENEKE NKN:[id number] DOB: 1941-07-18 DOA: 08/27/2021  PCP: Hill, Osborne date: 08/27/2021 Discharge date: 09/01/2021  Admitted From: Home Disposition: Home  Recommendations for Outpatient Follow-up:  Follow up with PCP in 1-2 weeks Ambulatory referral to general surgery  Home Health: No Equipment/Devices: None  Discharge Condition: Stable CODE STATUS: Full Diet recommendation: Regular  Brief/Interim Summary:  80 year old male with past medical history significant for essential hypertension, type 2 diabetes mellitus, chronic systolic congestive heart failure, CAD s/p PCI/CABG who presented to [facility] ED on 12/22 via EMS with confusion.  Wife contributed to HPI and states patient was found trying to clean out the kitchen and remove the countertops.  Patient did report discolored urine recently.  He reported history of chronic shortness of breath related to his CHF. Patient denied cough, no fever/chills, no abdominal pain, no diarrhea.  On EMS arrival, patient was found to be hypoxic with SPO2 in the high 80s and placed on supplemental oxygen.   In the ED, temperature 100.1 F, HR 91, RR 27, BP 99/52, SPO2 94% on 3 L nasal cannula.  WBC 7.6, hemoglobin 13.3, platelets 119.  Sodium 136, potassium 3.8, chloride 101, CO2 27, glucose 117, BUN 29, creatinine 2.12, AST 259, ALT 358, total bilirubin 7.1.  BNP 459.6.  Lactic acid 1.0, procalcitonin 5.73.  High sensitive troponin 280> 508> 390.  COVID-19 PCR negative.  Influenza A/B PCR negative.  Urinalysis with amber-colored urine, 100 glucose, large bilirubin, 100 protein; no bacteria and 11-20 WBCs.  EtOH level less than 10.  Acetaminophen level less than 10.  Chest x-ray with mild vascular congestion without edema.  CT head without contrast with mild atrophic changes without acute abnormality.  CT chest/abdomen/pelvis with no acute intrathoracic or intra-abdominal pathology, notable for  moderate emphysema with multiple pulmonary nodules most severe 4 mm right solid pulmonary nodule and No intra/extrahepatic biliary ductal dilation.  Given patient's confusion, hypertension, low-grade fever; patient was started on empiric antibiotics for sepsis of unclear etiology.  Hospitalist service consulted for further evaluation management.   Patient has been intermittently agitated.  Benzodiazepines seem to have a paradoxical effect.  Responds appropriately to administration of intramuscular antipsychotics.  Seen in consultation by psychiatry.  Patient mental status did improve after few days of IV antibiotics.  Suspect delirium secondary to acute infection versus hospital-acquired.  Mental status baseline at time of discharge.  On day of discharge patient is stable, tolerating food.  MRCP completed.  Very tiny possible common bile duct stone without signs of obstruction or CBD dilatation.  LFTs have normalized.  Suspect passed stone as etiology behind infection and sepsis.  At time of discharge will recommend 5 additional days of antibiotic therapy.  Will de-escalate to ciprofloxacin and metronidazole.  Given large amounts of stones noted in gallbladder will place ambulatory referral to general surgery for consideration of elective cholecystectomy.  Discharge plan explained to patient and wife at length at bedside.   Discharge Diagnoses:  Principal Problem:   Acute metabolic encephalopathy Active Problems:   Hypertension   Diabetes mellitus without complication (HCC)   AKI (acute kidney injury) ([place])   Pulmonary nodules   Cholelithiasis   Abnormal LFTs   Elevated troponin   Acute respiratory failure with hypoxia (HCC)   Severe sepsis (HCC)   Sepsis (HCC)   Agitation  Acute metabolic encephalopathy, POA: Improving Patient presenting with confusion, normally alert and oriented per family.  No leukocytosis, lactic acid within normal limits.  Elevated procalcitonin level.  Unclear  etiology but suspicion for infectious process versus hypotension versus toxic effects with liver failure. Encephalopathy has been waxing and waning UDS negative, ammonia 42 Suspect delirium superimposed on underlying cognitive decline in the setting of acute infection -12/25: Patient became acutely agitated and confused.  Violent activities towards hospital staff. 12/27: Mental status normalized.  Back to baseline.  Calm and cooperative Plan: Discharge home.  No antipsychotics or psychiatric regimen recommended on discharge.  Recommend follow-up with PCP as outpatient.  Consideration for cognitive decline studies   Severe sepsis, POA Patient met sepsis criteria on admission with tachypnea, tachycardia, fever, elevated procalcitonin in the setting of end organ damage with acute renal failure, acute liver failure, acute metabolic encephalopathy and MAP <70.   Chest x-ray with mild pulmonary vascular congestion, otherwise unrevealing.   CT chest/abdomen/pelvis with no acute intrathoracic or intra-abdominal findings. Renal and hepatic indices appear to be improving BCID 1/4 E. coli, no resistance Plan: Discontinue Rocephin and Flagyl.  Initiate ciprofloxacin and metronidazole p.o.  MRCP demonstrates significant cholelithiasis without cholecystitis.  Possible small CBD stone without evidence of obstruction or CBD dilatation.  LFTs have normalized.  At time of discharge will place ambulatory referral to general surgery for consideration of elective cholecystectomy   Hypotension Hx hypertension Patient was notably hypotensive on presentation.   Unclear etiology, sepsis as above versus overly controlled BP. Blood pressure improving Plan: Resume home regimen   New onset tachycardia Suspected new onset atrial fibrillation versus sinus tachycardia Plan: Resolved at time of discharge.  Can continue metoprolol per home dose outpatient follow-up    Elevated troponin likely secondary to type II  demand ischemia High sensitive troponin 280>508>390.   EKG with normal sinus rhythm without dynamic changes.   Denies chest pain.  Initially was started on a heparin drip, currently discontinued  Suspect supply demand ischemia  Plan:  Telemetry monitoring  EKG as needed chest pain    Acute liver injury with elevated bilirubin Patient presenting with acute confusion as above.   Was noted to have elevated LFTs on presentation with elevated total bilirubin.   CT abdomen/pelvis with no intrahepatic extrahepatic biliary ductal dilation and right upper quadrant ultrasound unrevealing.   EtOH level less than 10, Tylenol level less than 10.   Unclear etiology but suspect possible mild shock liver from hypotension. LFTs downtrending --AST 259>237>160>88 --ALT 358>340>264>190 --Tbili 7.1>6.5>5.6>3.8 --Acute hepatitis panel negative. Ammonia level 42 Plan: suspect acute liver injury secondary to passed stone.  No obstruction noted on imaging.  LFTs normalized.  Significant cholelithiasis without cholecystitis [place].  Ambulatory referral to general surgery.   Acute renal failure, improved Baseline creatinine 0.98 on 04/22/2021.   Creatinine elevated 2.12 on presentation.  Suspect prerenal azotemia from dehydration versus ATN from hypotension. --Cr 2.12>2.03>0.99 Plan: Continue home lisinopril      Elevated D-dimer Patient was found hypoxic by EMS and placed on supplemental oxygen. --Nuclear medicine VQ scan: Negative, low suspicion for PE   Chronic systolic/diastolic congestive heart failure Follows with cardiology at [facility].  Recent TTE 04/21/2021 with LVEF 40-45%, LV mildly dilated, LV systolic function moderately decreased, grade 2 diastolic dysfunction, moderate MR, LA/RA mildly dilated, RV mildly dilated.  Home medication regimen includes metoprolol tartrate 50 mg p.o. twice daily, lisinopril 5 mg p.o. twice daily, furosemide 20 mg p.o. daily.  Chest x-ray on admission with mild pulmonary  edema and was notably hypotensive. Plan: Resume home regimen.  Outpatient follow-up   Hyperlipidemia: Atorvastatin 40 mg p.o. daily  Type 2 diabetes mellitus On metformin outpatient.    CAD s/p PCI/CABG --Continue Plavix  -- Can resume statin  Discharge Instructions  Discharge Instructions     Ambulatory referral to General Surgery   Complete by: As directed    Diet - low sodium heart healthy   Complete by: As directed    Increase activity slowly   Complete by: As directed       Allergies as of 09/01/2021       Reactions   Ativan [lorazepam] Other (See Comments)   Causes AMS        Medication List     TAKE these medications    atorvastatin 40 MG tablet Commonly known as: LIPITOR Take 40 mg by mouth daily.   ciprofloxacin 500 MG tablet Commonly known as: CIPRO Take 1 tablet (500 mg total) by mouth 2 (two) times daily for 5 days.   clopidogrel 75 MG tablet Commonly known as: PLAVIX Take 75 mg by mouth daily.   furosemide 20 MG tablet Commonly known as: LASIX Take 20 mg by mouth daily.   gabapentin 300 MG capsule Commonly known as: NEURONTIN Take 300 mg by mouth 3 (three) times daily.   lisinopril 5 MG tablet Commonly known as: ZESTRIL Take 5 mg by mouth 2 (two) times daily.   metFORMIN 1000 MG tablet Commonly known as: GLUCOPHAGE Take 1,000 mg by mouth at bedtime.   metoprolol tartrate 50 MG tablet Commonly known as: LOPRESSOR Take 50 mg by mouth 2 (two) times daily.   metroNIDAZOLE 500 MG tablet Commonly known as: FLAGYL Take 1 tablet (500 mg total) by mouth every 12 (twelve) hours for 5 days.   morphine 30 MG 12 hr tablet Commonly known as: MS CONTIN Take 30 mg by mouth 2 (two) times daily as needed.   naloxone 4 MG/0.1ML Liqd nasal spray kit Commonly known as: NARCAN Place 1 spray into the nose as needed. One spray in either nostril once for known/suspected overdose. May repeat every 2 to 3 minutes in alternating nostrils until EMS  arrives.   traZODone 50 MG tablet Commonly known as: DESYREL Take 50-150 mg by mouth at bedtime as needed.        Allergies  Allergen Reactions   Ativan [Lorazepam] Other (See Comments)    Causes AMS    Consultations: Psychiatry   Procedures/Studies: CT HEAD WO CONTRAST (5MM)  Result Date: 08/29/2021 CLINICAL DATA:  Head trauma. EXAM: CT HEAD WITHOUT CONTRAST TECHNIQUE: Contiguous axial images were obtained from the base of the skull through the vertex without intravenous contrast. COMPARISON:  Head CT dated 08/28/2021. FINDINGS: Brain: Mild age-related atrophy and chronic microvascular ischemic changes. There is no acute intracranial hemorrhage. No mass effect or midline shift. No extra-axial fluid collection. Vascular: No hyperdense vessel or unexpected calcification. Skull: Normal. Negative for fracture or focal lesion. Sinuses/Orbits: Mild mucoperiosteal thickening of paranasal sinuses. No air-fluid level. Mastoid air cells are clear. Other: None IMPRESSION: 1. No acute intracranial pathology. 2. Mild age-related atrophy and chronic microvascular ischemic changes. Electronically Signed   By: Anner Crete M.D.   On: 08/29/2021 03:38   CT HEAD WO CONTRAST (5MM)  Result Date: 08/28/2021 CLINICAL DATA:  Altered mental status, possible UTI EXAM: CT HEAD WITHOUT CONTRAST TECHNIQUE: Contiguous axial images were obtained from the base of the skull through the vertex without intravenous contrast. COMPARISON:  None. FINDINGS: Brain: No evidence of acute infarction, hemorrhage, hydrocephalus, extra-axial collection or mass lesion/mass effect. Mild atrophic changes are noted commensurate  with the patient's given age. Vascular: No hyperdense vessel or unexpected calcification. Skull: Normal. Negative for fracture or focal lesion. Sinuses/Orbits: No acute finding. Other: None. IMPRESSION: Mild atrophic changes without acute abnormality. Electronically Signed   By: Inez Catalina M.D.   On:  08/28/2021 00:15   NM Pulmonary Perfusion  Result Date: 08/28/2021 CLINICAL DATA:  80 year old male with abnormal D-dimer. EXAM: NUCLEAR MEDICINE PERFUSION LUNG SCAN TECHNIQUE: Perfusion images were obtained in multiple projections after intravenous injection of radiopharmaceutical. Ventilation scans intentionally deferred if perfusion scan and chest x-ray adequate for interpretation during COVID 19 epidemic. RADIOPHARMACEUTICALS:  4.1 mCi Tc-14mMAA IV COMPARISON:  Noncontrast chest CT 0256 hours today, and portable chest 0020 hours. FINDINGS: Good radiotracer activity in both lungs. In most areas perfusion activity appears homogeneous. However, there is a small perfusion defect in the periphery of the mid right lung. And in this region on the earlier CT is chronic lung disease with emphysema and scarring. Similar small defect at the anterior left lung is felt related to mediastinal lipomatosis as seen on the earlier CT. No other convincing perfusion defect. IMPRESSION: Pulmonary embolus Unlikely. Subtle perfusion defect(s) only in areas of emphysema and lung scarring on the CT earlier today. Electronically Signed   By: HGenevie AnnM.D.   On: 08/28/2021 11:59   MR 3D Recon At Scanner  Result Date: 08/31/2021 CLINICAL DATA:  Jaundice, altered mental status EXAM: MRI ABDOMEN WITHOUT AND WITH CONTRAST (INCLUDING MRCP) TECHNIQUE: Multiplanar multisequence MR imaging of the abdomen was performed both before and after the administration of intravenous contrast. Heavily T2-weighted images of the biliary and pancreatic ducts were obtained, and three-dimensional MRCP images were rendered by post processing. CONTRAST:  7.580mGADAVIST GADOBUTROL 1 MMOL/ML IV SOLN COMPARISON:  CT chest abdomen pelvis, 08/28/2021 FINDINGS: Lower chest: No acute findings. Cardiomegaly. Round atelectasis of the right lung base. Hepatobiliary: No mass or other parenchymal abnormality identified. Numerous gallstones in the gallbladder.  Suspect at least one tiny calculus or gravel in the central common bile duct, measuring no greater than 0.3 cm (series 3, image 19, series 4, image 22). There is however no biliary ductal dilatation, common bile duct measuring up to 0.6 cm. Pancreas: No mass, inflammatory changes, or other parenchymal abnormality identified. Spleen:  Within normal limits in size and appearance. Adrenals/Urinary Tract: No masses identified. No evidence of hydronephrosis. Stomach/Bowel: Visualized portions within the abdomen are unremarkable. Vascular/Lymphatic: No pathologically enlarged lymph nodes identified. No abdominal aortic aneurysm demonstrated. Aortic atherosclerosis. Other:  None. Musculoskeletal: No suspicious bone lesions identified. IMPRESSION: 1. Numerous gallstones in the gallbladder. Suspect at least one tiny calculus or gravel in the central common bile duct, measuring no greater than 0.3 cm. There is however no biliary ductal dilatation, common bile duct measuring up to 0.6 cm. 2. No gallbladder wall thickening or pericholecystic fluid. 3. Cardiomegaly. Electronically Signed   By: AlDelanna Ahmadi.D.   On: 08/31/2021 11:16   DG Chest Port 1 View  Result Date: 08/29/2021 CLINICAL DATA:  Altered mental status, chronic shortness of breath EXAM: PORTABLE CHEST 1 VIEW COMPARISON:  08/28/2021 FINDINGS: Transverse diameter of heart is increased. Central pulmonary vessels are slightly less prominent. Subtle increase in interstitial markings are seen in the parahilar regions and lower lung fields. There is no focal pulmonary consolidation. There is there is evidence of previous coronary bypass surgery. There is no pleural effusion or pneumothorax. IMPRESSION: Cardiomegaly. Central pulmonary vessels are less prominent. Prominence of interstitial markings in the parahilar regions  and lower lung fields may suggest residual interstitial edema or interstitial pneumonitis or scarring. There is no new focal pulmonary  consolidation. Electronically Signed   By: Elmer Picker M.D.   On: 08/29/2021 15:23   DG Chest Portable 1 View  Result Date: 08/28/2021 CLINICAL DATA:  Altered mental status EXAM: PORTABLE CHEST 1 VIEW COMPARISON:  None. FINDINGS: Cardiac shadow is mildly prominent. Postsurgical changes are noted. Mild vascular congestion is seen. No edema is noted. No focal infiltrate is noted. No bony abnormality is seen. IMPRESSION: Mild vascular congestion without edema. Electronically Signed   By: Inez Catalina M.D.   On: 08/28/2021 00:32   MR ABDOMEN MRCP W WO CONTAST  Result Date: 08/31/2021 CLINICAL DATA:  Jaundice, altered mental status EXAM: MRI ABDOMEN WITHOUT AND WITH CONTRAST (INCLUDING MRCP) TECHNIQUE: Multiplanar multisequence MR imaging of the abdomen was performed both before and after the administration of intravenous contrast. Heavily T2-weighted images of the biliary and pancreatic ducts were obtained, and three-dimensional MRCP images were rendered by post processing. CONTRAST:  7.19m GADAVIST GADOBUTROL 1 MMOL/ML IV SOLN COMPARISON:  CT chest abdomen pelvis, 08/28/2021 FINDINGS: Lower chest: No acute findings. Cardiomegaly. Round atelectasis of the right lung base. Hepatobiliary: No mass or other parenchymal abnormality identified. Numerous gallstones in the gallbladder. Suspect at least one tiny calculus or gravel in the central common bile duct, measuring no greater than 0.3 cm (series 3, image 19, series 4, image 22). There is however no biliary ductal dilatation, common bile duct measuring up to 0.6 cm. Pancreas: No mass, inflammatory changes, or other parenchymal abnormality identified. Spleen:  Within normal limits in size and appearance. Adrenals/Urinary Tract: No masses identified. No evidence of hydronephrosis. Stomach/Bowel: Visualized portions within the abdomen are unremarkable. Vascular/Lymphatic: No pathologically enlarged lymph nodes identified. No abdominal aortic aneurysm  demonstrated. Aortic atherosclerosis. Other:  None. Musculoskeletal: No suspicious bone lesions identified. IMPRESSION: 1. Numerous gallstones in the gallbladder. Suspect at least one tiny calculus or gravel in the central common bile duct, measuring no greater than 0.3 cm. There is however no biliary ductal dilatation, common bile duct measuring up to 0.6 cm. 2. No gallbladder wall thickening or pericholecystic fluid. 3. Cardiomegaly. Electronically Signed   By: ADelanna AhmadiM.D.   On: 08/31/2021 11:16   ECHOCARDIOGRAM COMPLETE  Result Date: 08/31/2021    ECHOCARDIOGRAM REPORT   Patient Name:   DSEVERIANO UTSEYDate of Exam: 08/31/2021 Medical Rec #:  [id number]         Height:       70.0 in Accession #:    [id number]        Weight:       175.0 lb Date of Birth:  [birth year]         BSA:          1.972 m Patient Age:    89years           BP:           146/84 mmHg Patient Gender: M                  HR:           95 bpm. Exam Location:  [facility] Procedure: 2D Echo, Color Doppler and Cardiac Doppler Indications:     Atrial Fibrillation 48.91  History:         Patient has no prior history of Echocardiogram examinations.  CHF; Risk Factors:Diabetes, Hypertension and Dyslipidemia.  Sonographer:     Sherrie Sport Referring Phys:  [id number] Sidney Ace Diagnosing Phys: Yolonda Kida MD IMPRESSIONS  1. Mild inferior hypo.  2. Left ventricular ejection fraction, by estimation, is 45 to 50%. The left ventricle has mildly decreased function. The left ventricle demonstrates regional wall motion abnormalities (see scoring diagram/findings for description). The left ventricular  internal cavity size was mildly dilated. Left ventricular diastolic parameters are consistent with Grade I diastolic dysfunction (impaired relaxation).  3. Right ventricular systolic function is normal. The right ventricular size is normal.  4. The mitral valve is normal in structure. Mild to moderate mitral valve  regurgitation.  5. The aortic valve is grossly normal. Aortic valve regurgitation is not visualized. FINDINGS  Left Ventricle: Left ventricular ejection fraction, by estimation, is 45 to 50%. The left ventricle has mildly decreased function. The left ventricle demonstrates regional wall motion abnormalities. The left ventricular internal cavity size was mildly dilated. There is no left ventricular hypertrophy. Left ventricular diastolic parameters are consistent with Grade I diastolic dysfunction (impaired relaxation). Right Ventricle: The right ventricular size is normal. No increase in right ventricular wall thickness. Right ventricular systolic function is normal. Left Atrium: Left atrial size was normal in size. Right Atrium: Right atrial size was normal in size. Pericardium: There is no evidence of pericardial effusion. Mitral Valve: The mitral valve is normal in structure. Mild to moderate mitral valve regurgitation. MV peak gradient, 3.9 mmHg. The mean mitral valve gradient is 2.0 mmHg. Tricuspid Valve: The tricuspid valve is normal in structure. Tricuspid valve regurgitation is mild. Aortic Valve: The aortic valve is grossly normal. Aortic valve regurgitation is not visualized. Aortic valve mean gradient measures 1.5 mmHg. Aortic valve peak gradient measures 2.7 mmHg. Aortic valve area, by VTI measures 2.81 cm. Pulmonic Valve: The pulmonic valve was grossly normal. Pulmonic valve regurgitation is not visualized. Aorta: The ascending aorta was not well visualized. IAS/Shunts: No atrial level shunt detected by color flow Doppler. Additional Comments: Mild inferior hypo. There is no pleural effusion.  LEFT VENTRICLE PLAX 2D LVIDd:         5.20 cm   Diastology LVIDs:         3.80 cm   LV e' medial:    4.35 cm/s LV PW:         1.40 cm   LV E/e' medial:  14.8 LV IVS:        0.90 cm   LV e' lateral:   3.81 cm/s LVOT diam:     2.00 cm   LV E/e' lateral: 16.9 LV SV:         36 LV SV Index:   18 LVOT Area:     3.14  cm  RIGHT VENTRICLE RV Basal diam:  3.90 cm RV S prime:     10.00 cm/s LEFT ATRIUM             Index        RIGHT ATRIUM           Index LA diam:        4.00 cm 2.03 cm/m   RA Area:     15.90 cm LA Vol (A2C):   76.4 ml 38.74 ml/m  RA Volume:   36.70 ml  18.61 ml/m LA Vol (A4C):   30.3 ml 15.36 ml/m LA Biplane Vol: 49.6 ml 25.15 ml/m  AORTIC VALVE  PULMONIC VALVE AV Area (Vmax):    1.98 cm     PV Vmax:        0.51 m/s AV Area (Vmean):   2.13 cm     PV Vmean:       31.800 cm/s AV Area (VTI):     2.81 cm     PV VTI:         0.090 m AV Vmax:           81.80 cm/s   PV Peak grad:   1.0 mmHg AV Vmean:          53.000 cm/s  PV Mean grad:   1.0 mmHg AV VTI:            0.130 m      RVOT Peak grad: 1 mmHg AV Peak Grad:      2.7 mmHg AV Mean Grad:      1.5 mmHg LVOT Vmax:         51.60 cm/s LVOT Vmean:        35.900 cm/s LVOT VTI:          0.116 m LVOT/AV VTI ratio: 0.90 MITRAL VALVE               TRICUSPID VALVE MV Area (PHT): 5.02 cm    TR Peak grad:   7.1 mmHg MV Area VTI:   1.90 cm    TR Vmax:        133.00 cm/s MV Peak grad:  3.9 mmHg MV Mean grad:  2.0 mmHg    SHUNTS MV Vmax:       0.99 m/s    Systemic VTI:  0.12 m MV Vmean:      64.0 cm/s   Systemic Diam: 2.00 cm MV Decel Time: 151 msec    Pulmonic VTI:  0.099 m MV E velocity: 64.30 cm/s MV A velocity: 99.00 cm/s MV E/A ratio:  0.65 Dwayne D Callwood MD Electronically signed by Yolonda Kida MD Signature Date/Time: 08/31/2021/1:45:36 PM    Final    CT CHEST ABDOMEN PELVIS WO CONTRAST  Result Date: 08/28/2021 CLINICAL DATA:  Chronic dyspnea EXAM: CT CHEST, ABDOMEN AND PELVIS WITHOUT CONTRAST TECHNIQUE: Multidetector CT imaging of the chest, abdomen and pelvis was performed following the standard protocol without IV contrast. COMPARISON:  None. FINDINGS: CT CHEST FINDINGS Cardiovascular: Coronary artery bypass grafting has been performed. Cardiac size within normal limits. No pericardial effusion. Central pulmonary arteries are  enlarged in keeping with changes of pulmonary arterial hypertension. Moderate atherosclerotic calcification within the thoracic aorta. No aortic aneurysm. Mediastinum/Nodes: No enlarged mediastinal, hilar, or axillary lymph nodes. Thyroid gland, trachea, and esophagus demonstrate no significant findings. Lungs/Pleura: Moderate emphysema. Right basilar pleural thickening noted posteriorly with associated small focus of rounded atelectasis within the posterior basal right lower lobe. Two 4 mm noncalcified pulmonary nodules are seen within the subpleural right middle lobe, axial image # [id number], series 4, indeterminate. No focal pulmonary infiltrate. No pneumothorax or pleural effusion. Musculoskeletal: No chest wall mass or suspicious bone lesions identified. CT ABDOMEN PELVIS FINDINGS Hepatobiliary: Cholelithiasis without pericholecystic inflammatory change. Liver unremarkable. No intra or extrahepatic biliary ductal dilation. Pancreas: Unremarkable Spleen: Mild splenomegaly is present with the spleen measuring 15.9 cm in greatest dimension. No intrasplenic lesions are seen. Adrenals/Urinary Tract: Adrenal glands are unremarkable. Kidneys are normal, without renal calculi, focal lesion, or hydronephrosis. Bladder is unremarkable. Stomach/Bowel: Moderate to severe descending and sigmoid colonic diverticulosis without superimposed acute inflammatory change. Stomach, small bowel, and  large bowel are otherwise unremarkable. Appendix normal. No free intraperitoneal gas or fluid. Vascular/Lymphatic: Moderate aortoiliac atherosclerotic calcification. No aortic aneurysm. No pathologic adenopathy within the abdomen and pelvis. Reproductive: Prostate is unremarkable. Other: Bilateral inguinal hernia repair with mesh has been performed. No recurrent abdominal wall hernia. Musculoskeletal: No acute bone abnormality. No lytic or blastic bone lesion. IMPRESSION: No acute intrathoracic or intra-abdominal pathology identified.  Moderate emphysema. Multiple pulmonary nodules. Most severe: 4 mm right solid pulmonary nodule. No routine follow-up imaging is recommended per Fleischner Society Guidelines. These guidelines do not apply to immunocompromised patients and patients with cancer. Follow up in patients with significant comorbidities as clinically warranted. For lung cancer screening, adhere to Lung-RADS guidelines. Reference: Radiology. 2017; 284(1):228-43. Morphologic changes in keeping with pulmonary arterial hypertension. Cholelithiasis. Mild splenomegaly. Moderate to severe distal colonic diverticulosis. No superimposed acute inflammatory change. Aortic Atherosclerosis (ICD10-I70.0) and Emphysema (ICD10-J43.9). Electronically Signed   By: Fidela Salisbury M.D.   On: 08/28/2021 03:19   US ABDOMEN LIMITED RUQ (LIVER/GB)  Result Date: 08/28/2021 CLINICAL DATA:  Elevated liver enzymes EXAM: ULTRASOUND ABDOMEN LIMITED RIGHT UPPER QUADRANT COMPARISON:  08/28/2021 FINDINGS: Gallbladder: Cholelithiasis. No gallbladder wall thickening or pericholecystic fluid. Largest gallstone measures 8.5 mm. Negative sonographic Murphy sign. Common bile duct: Diameter: 6 mm Liver: No focal lesion identified. Within normal limits in parenchymal echogenicity. Portal vein is patent on color Doppler imaging with normal direction of blood flow towards the liver. Other: None. IMPRESSION: Cholelithiasis without sonographic evidence of acute cholecystitis. Electronically Signed   By: Kathreen Devoid M.D.   On: 08/28/2021 07:37      Subjective: Seen and examined on the day of discharge.  Stable no distress.  Wife at bedside.  Patient stable for discharge home.  Discharge Exam: Vitals:   09/01/21 0744 09/01/21 0804  BP: (!) 105/57   Pulse: 84   Resp: (!) 22   Temp: 97.7 F (36.5 C)   SpO2: 96% 95%   Vitals:   09/01/21 0353 09/01/21 0356 09/01/21 0744 09/01/21 0804  BP: (!) 143/64  (!) 105/57   Pulse: 85  84   Resp: 16  (!) 22   Temp: 98.2 F  (36.8 C)  97.7 F (36.5 C)   TempSrc: Oral  Oral   SpO2: 93%  96% 95%  Weight:  77.2 kg    Height:        General: Pt is alert, awake, not in acute distress Cardiovascular: RRR, S1/S2 +, no rubs, no gallops Respiratory: CTA bilaterally, no wheezing, no rhonchi Abdominal: Soft, NT, ND, bowel sounds + Extremities: no edema, no cyanosis    The results of significant diagnostics from this hospitalization (including imaging, microbiology, ancillary and laboratory) are listed below for reference.     Microbiology: Recent Results (from the past 240 hour(s))  Resp Panel by RT-PCR (Flu A&B, Covid) Nasopharyngeal Swab     Status: None   Collection Time: 08/28/21 12:20 AM   Specimen: Nasopharyngeal Swab; Nasopharyngeal(NP) swabs in vial transport medium  Result Value Ref Range Status   SARS Coronavirus 2 by RT PCR NEGATIVE NEGATIVE Final    Comment: (NOTE) SARS-CoV-2 target nucleic acids are NOT DETECTED.  The SARS-CoV-2 RNA is generally detectable in upper respiratory specimens during the acute phase of infection. The lowest concentration of SARS-CoV-2 viral copies this assay can detect is 138 copies/mL. A negative result does not preclude SARS-Cov-2 infection and should not be used as the sole basis for treatment or other patient management decisions. A negative result  may occur with  improper specimen collection/handling, submission of specimen other than nasopharyngeal swab, presence of viral mutation(s) within the areas targeted by this assay, and inadequate number of viral copies(<138 copies/mL). A negative result must be combined with clinical observations, patient history, and epidemiological information. The expected result is Negative.  Fact Sheet for Patients:  EntrepreneurPulse.com.au  Fact Sheet for Healthcare Providers:  IncredibleEmployment.be  This test is no t yet approved or cleared by the [place] FDA and  has been  authorized for detection and/or diagnosis of SARS-CoV-2 by FDA under an Emergency Use Authorization (EUA). This EUA will remain  in effect (meaning this test can be used) for the duration of the COVID-19 declaration under Section 564(b)(1) of the Act, 21 U.S.C.section 360bbb-3(b)(1), unless the authorization is terminated  or revoked sooner.       Influenza A by PCR NEGATIVE NEGATIVE Final   Influenza B by PCR NEGATIVE NEGATIVE Final    Comment: (NOTE) The Xpert Xpress SARS-CoV-2/FLU/RSV plus assay is intended as an aid in the diagnosis of influenza from Nasopharyngeal swab specimens and should not be used as a sole basis for treatment. Nasal washings and aspirates are unacceptable for Xpert Xpress SARS-CoV-2/FLU/RSV testing.  Fact Sheet for Patients: EntrepreneurPulse.com.au  Fact Sheet for Healthcare Providers: IncredibleEmployment.be  This test is not yet approved or cleared by the [place] FDA and has been authorized for detection and/or diagnosis of SARS-CoV-2 by FDA under an Emergency Use Authorization (EUA). This EUA will remain in effect (meaning this test can be used) for the duration of the COVID-19 declaration under Section 564(b)(1) of the Act, 21 U.S.C. section 360bbb-3(b)(1), unless the authorization is terminated or revoked.  Performed at [facility], [address] [place], [place] [address]   Urine Culture     Status: Abnormal   Collection Time: 08/28/21 12:20 AM   Specimen: Urine, Random  Result Value Ref Range Status   Specimen Description   Final    URINE, RANDOM Performed at [facility], [address], [place], [place] [address]    Special Requests   Final    NONE Performed at [facility], [address] [place], [place] [address]    Culture (A)  Final    <10,000 COLONIES/mL INSIGNIFICANT GROWTH Performed at [facility], [place] [address] [place], [place] [address]     Report Status 08/29/2021 FINAL  Final  Culture, blood (Routine X 2) w Reflex to ID Panel     Status: Abnormal   Collection Time: 08/28/21  3:51 AM   Specimen: BLOOD  Result Value Ref Range Status   Specimen Description   Final    BLOOD Performed at [facility], [address] [place], [place] [address]    Special Requests   Final    BOTTLES DRAWN AEROBIC AND ANAEROBIC Blood Culture adequate volume Performed at [facility], [address], [place], [place] [address]    Culture  Setup Time   Final    AEROBIC BOTTLE ONLY GRAM NEGATIVE RODS Organism ID to follow CRITICAL RESULT CALLED TO, READ BACK BY AND VERIFIED WITH: McGehee '@[id number]'  08/29/21 MJU Performed at [facility], [address] [place], [place] [address]    Culture ESCHERICHIA COLI (A)  Final   Report Status 09/01/2021 FINAL  Final   Organism ID, Bacteria ESCHERICHIA COLI  Final      Susceptibility   Escherichia coli - MIC*    AMPICILLIN <=2 SENSITIVE Sensitive  CEFAZOLIN <=4 SENSITIVE Sensitive     CEFEPIME <=0.12 SENSITIVE Sensitive     CEFTAZIDIME <=1 SENSITIVE Sensitive     CEFTRIAXONE <=0.25 SENSITIVE Sensitive     CIPROFLOXACIN <=0.25 SENSITIVE Sensitive     GENTAMICIN <=1 SENSITIVE Sensitive     IMIPENEM <=0.25 SENSITIVE Sensitive     TRIMETH/SULFA <=20 SENSITIVE Sensitive     AMPICILLIN/SULBACTAM <=2 SENSITIVE Sensitive     PIP/TAZO <=4 SENSITIVE Sensitive     * ESCHERICHIA COLI  Blood Culture ID Panel (Reflexed)     Status: Abnormal   Collection Time: 08/28/21  3:51 AM  Result Value Ref Range Status   Enterococcus faecalis NOT DETECTED NOT DETECTED Final   Enterococcus Faecium NOT DETECTED NOT DETECTED Final   Listeria monocytogenes NOT DETECTED NOT DETECTED Final   Staphylococcus species NOT DETECTED NOT DETECTED Final   Staphylococcus aureus (BCID) NOT DETECTED NOT DETECTED Final   Staphylococcus epidermidis NOT DETECTED NOT DETECTED Final   Staphylococcus  lugdunensis NOT DETECTED NOT DETECTED Final   Streptococcus species NOT DETECTED NOT DETECTED Final   Streptococcus agalactiae NOT DETECTED NOT DETECTED Final   Streptococcus pneumoniae NOT DETECTED NOT DETECTED Final   Streptococcus pyogenes NOT DETECTED NOT DETECTED Final   A.calcoaceticus-baumannii NOT DETECTED NOT DETECTED Final   Bacteroides fragilis NOT DETECTED NOT DETECTED Final   Enterobacterales DETECTED (A) NOT DETECTED Final    Comment: Enterobacterales represent a large order of gram negative bacteria, not a single organism. CRITICAL RESULT CALLED TO, READ BACK BY AND VERIFIED WITH: Loiza '@[id number]'  08/29/21 MJU    Enterobacter cloacae complex NOT DETECTED NOT DETECTED Final   Escherichia coli DETECTED (A) NOT DETECTED Final    Comment: CRITICAL RESULT CALLED TO, READ BACK BY AND VERIFIED WITH: SUSAN WATSON '@[id number]'  08/29/21 MJU    Klebsiella aerogenes NOT DETECTED NOT DETECTED Final   Klebsiella oxytoca NOT DETECTED NOT DETECTED Final   Klebsiella pneumoniae NOT DETECTED NOT DETECTED Final   Proteus species NOT DETECTED NOT DETECTED Final   Salmonella species NOT DETECTED NOT DETECTED Final   Serratia marcescens NOT DETECTED NOT DETECTED Final   Haemophilus influenzae NOT DETECTED NOT DETECTED Final   Neisseria meningitidis NOT DETECTED NOT DETECTED Final   Pseudomonas aeruginosa NOT DETECTED NOT DETECTED Final   Stenotrophomonas maltophilia NOT DETECTED NOT DETECTED Final   Candida albicans NOT DETECTED NOT DETECTED Final   Candida auris NOT DETECTED NOT DETECTED Final   Candida glabrata NOT DETECTED NOT DETECTED Final   Candida krusei NOT DETECTED NOT DETECTED Final   Candida parapsilosis NOT DETECTED NOT DETECTED Final   Candida tropicalis NOT DETECTED NOT DETECTED Final   Cryptococcus neoformans/gattii NOT DETECTED NOT DETECTED Final   CTX-M ESBL NOT DETECTED NOT DETECTED Final   Carbapenem resistance IMP NOT DETECTED NOT DETECTED Final   Carbapenem resistance KPC  NOT DETECTED NOT DETECTED Final   Carbapenem resistance NDM NOT DETECTED NOT DETECTED Final   Carbapenem resist OXA 48 LIKE NOT DETECTED NOT DETECTED Final   Carbapenem resistance VIM NOT DETECTED NOT DETECTED Final    Comment: Performed at [facility], [address] [place], [place] [address]  Culture, blood (Routine X 2) w Reflex to ID Panel     Status: None (Preliminary result)   Collection Time: 08/28/21  3:54 AM   Specimen: BLOOD  Result Value Ref Range Status   Specimen Description BLOOD  Final   Special Requests   Final    BOTTLES DRAWN AEROBIC AND ANAEROBIC Blood Culture adequate  volume   Culture   Final    NO GROWTH 4 DAYS Performed at [facility], [address] [place], [place] [address]    Report Status PENDING  Incomplete  MRSA Next Gen by PCR, Nasal     Status: None   Collection Time: 08/29/21 10:42 AM   Specimen: Nasal Mucosa; Nasal Swab  Result Value Ref Range Status   MRSA by PCR Next Gen NOT DETECTED NOT DETECTED Final    Comment: (NOTE) The GeneXpert MRSA Assay (FDA approved for NASAL specimens only), is one component of a comprehensive MRSA colonization surveillance program. It is not intended to diagnose MRSA infection nor to guide or monitor treatment for MRSA infections. Test performance is not FDA approved in patients less than 72 years old. Performed at [facility], [address] [place], [place] [address]      Labs: BNP (last 3 results) Recent Labs    08/28/21 0145  BNP 416.3*   Basic Metabolic Panel: Recent Labs  Lab 08/28/21 0145 08/29/21 0716 08/29/21 1159 08/30/21 0634 08/31/21 0651 09/01/21 0728  NA 137 140  --  139 140 141  K 4.1 3.6  --  2.9* 3.5 3.6  CL 102 105  --  101 107 105  CO2 28 28  --  '25 25 27  ' GLUCOSE 113* 131*  --  141* 199* 134*  BUN 27* 21  --  '21 18 19  ' CREATININE 2.03* 0.99 0.91 0.95 1.04 1.06  CALCIUM 8.4* 8.6*  --  9.0 8.9 8.9  MG  --   --   --   --   --  1.3*   Liver  Function Tests: Recent Labs  Lab 08/28/21 0954 08/29/21 0716 08/30/21 0634 08/31/21 0651 09/01/21 0728  AST 160* 88* 58* 63* 55*  ALT 264* 190* 147* 114* 94*  ALKPHOS 114 109 104 99 94  BILITOT 5.6* 3.8* 3.3* 2.0* 1.8*  PROT 5.8* 6.3* 6.9 6.5 6.5  ALBUMIN 3.2* 3.4* 3.6 3.6 3.7   Recent Labs  Lab 08/28/21 0351  LIPASE 25   Recent Labs  Lab 08/28/21 0954  AMMONIA 42*   CBC: Recent Labs  Lab 08/27/21 2355 08/28/21 0522 08/29/21 0716 08/29/21 1159 08/30/21 0634 09/01/21 0728  WBC 7.6 7.3 5.8 5.1 7.3 9.6  NEUTROABS 6.5  --   --   --  5.4 6.3  HGB 12.3* 12.3* 12.7* 13.0 14.2 13.7  HCT 36.7* 35.9* 36.5* 37.9* 40.5 40.5  MCV 92.7 91.6 88.8 88.1 87.3 88.6  PLT 119* 120* 138* 137* 153 175   Cardiac Enzymes: No results for input(s): CKTOTAL, CKMB, CKMBINDEX, TROPONINI in the last 168 hours. BNP: Invalid input(s): POCBNP CBG: Recent Labs  Lab 08/31/21 0731 08/31/21 1118 08/31/21 1603 08/31/21 2101 09/01/21 0742  GLUCAP 182* 123* 210* 118* 131*   D-Dimer No results for input(s): DDIMER in the last 72 hours. Hgb A1c No results for input(s): HGBA1C in the last 72 hours. Lipid Profile No results for input(s): CHOL, HDL, LDLCALC, TRIG, CHOLHDL, LDLDIRECT in the last 72 hours. Thyroid function studies No results for input(s): TSH, T4TOTAL, T3FREE, THYROIDAB in the last 72 hours.  Invalid input(s): FREET3 Anemia work up No results for input(s): VITAMINB12, FOLATE, FERRITIN, TIBC, IRON, RETICCTPCT in the last 72 hours. Urinalysis    Component Value Date/Time   COLORURINE AMBER (A) 08/28/2021 0020   APPEARANCEUR HAZY (A) 08/28/2021 0020   LABSPEC 1.020 08/28/2021 0020   PHURINE 5.5 08/28/2021 0020   GLUCOSEU 100 (  A) 08/28/2021 0020   HGBUR NEGATIVE 08/28/2021 0020   BILIRUBINUR LARGE (A) 08/28/2021 0020   KETONESUR 15 (A) 08/28/2021 0020   PROTEINUR 100 (A) 08/28/2021 0020   NITRITE NEGATIVE 08/28/2021 0020   LEUKOCYTESUR NEGATIVE 08/28/2021 0020   Sepsis  Labs Invalid input(s): PROCALCITONIN,  WBC,  LACTICIDVEN Microbiology Recent Results (from the past 240 hour(s))  Resp Panel by RT-PCR (Flu A&B, Covid) Nasopharyngeal Swab     Status: None   Collection Time: 08/28/21 12:20 AM   Specimen: Nasopharyngeal Swab; Nasopharyngeal(NP) swabs in vial transport medium  Result Value Ref Range Status   SARS Coronavirus 2 by RT PCR NEGATIVE NEGATIVE Final    Comment: (NOTE) SARS-CoV-2 target nucleic acids are NOT DETECTED.  The SARS-CoV-2 RNA is generally detectable in upper respiratory specimens during the acute phase of infection. The lowest concentration of SARS-CoV-2 viral copies this assay can detect is 138 copies/mL. A negative result does not preclude SARS-Cov-2 infection and should not be used as the sole basis for treatment or other patient management decisions. A negative result may occur with  improper specimen collection/handling, submission of specimen other than nasopharyngeal swab, presence of viral mutation(s) within the areas targeted by this assay, and inadequate number of viral copies(<138 copies/mL). A negative result must be combined with clinical observations, patient history, and epidemiological information. The expected result is Negative.  Fact Sheet for Patients:  EntrepreneurPulse.com.au  Fact Sheet for Healthcare Providers:  IncredibleEmployment.be  This test is no t yet approved or cleared by the [place] FDA and  has been authorized for detection and/or diagnosis of SARS-CoV-2 by FDA under an Emergency Use Authorization (EUA). This EUA will remain  in effect (meaning this test can be used) for the duration of the COVID-19 declaration under Section 564(b)(1) of the Act, 21 U.S.C.section 360bbb-3(b)(1), unless the authorization is terminated  or revoked sooner.       Influenza A by PCR NEGATIVE NEGATIVE Final   Influenza B by PCR NEGATIVE NEGATIVE Final    Comment:  (NOTE) The Xpert Xpress SARS-CoV-2/FLU/RSV plus assay is intended as an aid in the diagnosis of influenza from Nasopharyngeal swab specimens and should not be used as a sole basis for treatment. Nasal washings and aspirates are unacceptable for Xpert Xpress SARS-CoV-2/FLU/RSV testing.  Fact Sheet for Patients: EntrepreneurPulse.com.au  Fact Sheet for Healthcare Providers: IncredibleEmployment.be  This test is not yet approved or cleared by the [place] FDA and has been authorized for detection and/or diagnosis of SARS-CoV-2 by FDA under an Emergency Use Authorization (EUA). This EUA will remain in effect (meaning this test can be used) for the duration of the COVID-19 declaration under Section 564(b)(1) of the Act, 21 U.S.C. section 360bbb-3(b)(1), unless the authorization is terminated or revoked.  Performed at [facility], [address], [place], [place] [address]   Urine Culture     Status: Abnormal   Collection Time: 08/28/21 12:20 AM   Specimen: Urine, Random  Result Value Ref Range Status   Specimen Description   Final    URINE, RANDOM Performed at [facility], [address], [place], [place] [address]    Special Requests   Final    NONE Performed at [facility], [address] [place], [place] [address]    Culture (A)  Final    <10,000 COLONIES/mL INSIGNIFICANT GROWTH Performed at [facility], [place] [address] [place],  [address]    Report Status 08/29/2021 FINAL  Final  Culture, blood (  Routine X 2) w Reflex to ID Panel     Status: Abnormal   Collection Time: 08/28/21  3:51 AM   Specimen: BLOOD  Result Value Ref Range Status   Specimen Description   Final    BLOOD Performed at [facility], [address] [place], [place] [address]    Special Requests   Final    BOTTLES DRAWN AEROBIC AND ANAEROBIC Blood Culture adequate volume Performed at [facility], [address] [place], [place] [address]    Culture  Setup Time   Final    AEROBIC BOTTLE ONLY GRAM NEGATIVE RODS Organism ID to follow CRITICAL RESULT CALLED TO, READ BACK BY AND VERIFIED WITH: SUSAN WATSON '@[id number]'  08/29/21 MJU Performed at [facility], [address] [place], [place] [address]    Culture ESCHERICHIA COLI (A)  Final   Report Status 09/01/2021 FINAL  Final   Organism ID, Bacteria ESCHERICHIA COLI  Final      Susceptibility   Escherichia coli - MIC*    AMPICILLIN <=2 SENSITIVE Sensitive     CEFAZOLIN <=4 SENSITIVE Sensitive     CEFEPIME <=0.12 SENSITIVE Sensitive     CEFTAZIDIME <=1 SENSITIVE Sensitive     CEFTRIAXONE <=0.25 SENSITIVE Sensitive     CIPROFLOXACIN <=0.25 SENSITIVE Sensitive     GENTAMICIN <=1 SENSITIVE Sensitive     IMIPENEM <=0.25 SENSITIVE Sensitive     TRIMETH/SULFA <=20 SENSITIVE Sensitive     AMPICILLIN/SULBACTAM <=2 SENSITIVE Sensitive     PIP/TAZO <=4 SENSITIVE Sensitive     * ESCHERICHIA COLI  Blood Culture ID Panel (Reflexed)     Status: Abnormal   Collection Time: 08/28/21  3:51 AM  Result Value Ref Range Status   Enterococcus faecalis NOT DETECTED NOT DETECTED Final   Enterococcus Faecium NOT DETECTED NOT DETECTED Final   Listeria monocytogenes NOT DETECTED NOT DETECTED Final   Staphylococcus species NOT DETECTED NOT DETECTED Final   Staphylococcus aureus (BCID) NOT DETECTED NOT DETECTED Final   Staphylococcus epidermidis NOT DETECTED NOT DETECTED Final   Staphylococcus lugdunensis NOT DETECTED NOT DETECTED Final   Streptococcus species NOT DETECTED NOT DETECTED Final   Streptococcus agalactiae NOT DETECTED NOT DETECTED Final   Streptococcus pneumoniae NOT DETECTED NOT DETECTED Final   Streptococcus pyogenes NOT DETECTED NOT DETECTED Final   A.calcoaceticus-baumannii NOT DETECTED NOT DETECTED Final   Bacteroides fragilis NOT DETECTED NOT DETECTED Final   Enterobacterales DETECTED (A) NOT DETECTED Final    Comment:  Enterobacterales represent a large order of gram negative bacteria, not a single organism. CRITICAL RESULT CALLED TO, READ BACK BY AND VERIFIED WITH: SUSAN WATSON '@[id number]'  08/29/21 MJU    Enterobacter cloacae complex NOT DETECTED NOT DETECTED Final   Escherichia coli DETECTED (A) NOT DETECTED Final    Comment: CRITICAL RESULT CALLED TO, READ BACK BY AND VERIFIED WITH: SUSAN WATSON '@[id number]'  08/29/21 MJU    Klebsiella aerogenes NOT DETECTED NOT DETECTED Final   Klebsiella oxytoca NOT DETECTED NOT DETECTED Final   Klebsiella pneumoniae NOT DETECTED NOT DETECTED Final   Proteus species NOT DETECTED NOT DETECTED Final   Salmonella species NOT DETECTED NOT DETECTED Final   Serratia marcescens NOT DETECTED NOT DETECTED Final   Haemophilus influenzae NOT DETECTED NOT DETECTED Final   Neisseria meningitidis NOT DETECTED NOT DETECTED Final   Pseudomonas aeruginosa NOT DETECTED NOT DETECTED Final   Stenotrophomonas maltophilia NOT DETECTED NOT DETECTED Final   Candida albicans NOT DETECTED NOT DETECTED Final   Candida auris NOT DETECTED NOT  DETECTED Final   Candida glabrata NOT DETECTED NOT DETECTED Final   Candida krusei NOT DETECTED NOT DETECTED Final   Candida parapsilosis NOT DETECTED NOT DETECTED Final   Candida tropicalis NOT DETECTED NOT DETECTED Final   Cryptococcus neoformans/gattii NOT DETECTED NOT DETECTED Final   CTX-M ESBL NOT DETECTED NOT DETECTED Final   Carbapenem resistance IMP NOT DETECTED NOT DETECTED Final   Carbapenem resistance KPC NOT DETECTED NOT DETECTED Final   Carbapenem resistance NDM NOT DETECTED NOT DETECTED Final   Carbapenem resist OXA 48 LIKE NOT DETECTED NOT DETECTED Final   Carbapenem resistance VIM NOT DETECTED NOT DETECTED Final    Comment: Performed at [facility], [address] [place], [place] [address]  Culture, blood (Routine X 2) w Reflex to ID Panel     Status: None (Preliminary result)   Collection Time: 08/28/21  3:54 AM   Specimen: BLOOD   Result Value Ref Range Status   Specimen Description BLOOD  Final   Special Requests   Final    BOTTLES DRAWN AEROBIC AND ANAEROBIC Blood Culture adequate volume   Culture   Final    NO GROWTH 4 DAYS Performed at [facility], [address] [place], [place] [address]    Report Status PENDING  Incomplete  MRSA Next Gen by PCR, Nasal     Status: None   Collection Time: 08/29/21 10:42 AM   Specimen: Nasal Mucosa; Nasal Swab  Result Value Ref Range Status   MRSA by PCR Next Gen NOT DETECTED NOT DETECTED Final    Comment: (NOTE) The GeneXpert MRSA Assay (FDA approved for NASAL specimens only), is one component of a comprehensive MRSA colonization surveillance program. It is not intended to diagnose MRSA infection nor to guide or monitor treatment for MRSA infections. Test performance is not FDA approved in patients less than 78 years old. Performed at [facility], [address], [place], [place] [address]      Time coordinating discharge: Over 30 minutes  SIGNED:   Sidney Ace, MD  [facility] 09/01/2021, 10:49 AM Pager   If 7PM-7AM, please contact night-coverage

## 2021-09-02 LAB — CULTURE, BLOOD (ROUTINE X 2)
Culture: NO GROWTH
Special Requests: ADEQUATE

## 2021-09-16 ENCOUNTER — Telehealth: Payer: Self-pay

## 2021-09-16 ENCOUNTER — Encounter: Payer: Self-pay | Admitting: Surgery

## 2021-09-16 ENCOUNTER — Ambulatory Visit: Payer: PPO | Admitting: Surgery

## 2021-09-16 ENCOUNTER — Other Ambulatory Visit: Payer: Self-pay

## 2021-09-16 VITALS — BP 120/67 | HR 77 | Temp 98.3°F | Ht 70.0 in | Wt 176.6 lb

## 2021-09-16 DIAGNOSIS — K8070 Calculus of gallbladder and bile duct without cholecystitis without obstruction: Secondary | ICD-10-CM

## 2021-09-16 DIAGNOSIS — K8080 Other cholelithiasis without obstruction: Secondary | ICD-10-CM

## 2021-09-16 DIAGNOSIS — K8020 Calculus of gallbladder without cholecystitis without obstruction: Secondary | ICD-10-CM

## 2021-09-16 NOTE — Telephone Encounter (Signed)
Faxed Cardiac clearance to Dr. Hunt Oris at [phone].

## 2021-09-16 NOTE — Patient Instructions (Addendum)
A referral to [facility] has been placed for an ERCP. They will call you with an appointment.  Please go to [facility] to get labs drawn. You do not need an appointment.   If you have any concerns or questions, please feel free to call our office.   Cholelithiasis Cholelithiasis happens when gallstones form in the gallbladder. The gallbladder stores bile. Bile is a fluid that helps digest fats. Bile can harden and form into gallstones. If they cause a blockage, they can cause pain (gallbladder attack). What are the causes? This condition may be caused by: Some blood diseases, such as sickle cell anemia. Too much of a fat-like substance (cholesterol) in your bile. Not enough bile salts in your bile. These salts help the body absorb and digest fats. The gallbladder not emptying fully or often enough. This is common in pregnant women. What increases the risk? The following factors may make you more likely to develop this condition: Being male. Being pregnant many times. Eating a lot of fried foods, fat, and refined carbs (refined carbohydrates). Being very overweight (obese). Being older than age 59. Using medicines with male hormones in them for a long time. Losing weight fast. Having gallstones in your family. Having some health problems, such as diabetes, Crohn's disease, or liver disease. What are the signs or symptoms? Often, there may be gallstones but no symptoms. These gallstones are called silent gallstones. If a gallstone causes a blockage, you may get sudden pain. The pain: Can be in the upper right part of your belly (abdomen). Normally comes at night or after you eat. Can last an hour or more. Can spread to your right shoulder, back, or chest. Can feel like discomfort, burning, or fullness in the upper part of your belly (indigestion). If the blockage lasts more than a few hours, you can get an infection or swelling. You may: Feel like you may  vomit. Vomit. Feel bloated. Have belly pain for 5 hours or more. Feel tender in your belly, often in the upper right part and under your ribs. Have fever or chills. Have skin or the white parts of your eyes turn yellow (jaundice). Have dark pee (urine) or pale poop (stool). How is this treated? Treatment for this condition depends on how bad you feel. If you have symptoms, you may need: Home care, if symptoms are not very bad. Do not eat for 12-24 hours. Drink only water and clear liquids. Start to eat simple or clear foods after 1 or 2 days. Try broths and crackers. You may need medicines for pain or stomach upset or both. If you have an infection, you will need antibiotics. A hospital stay, if you have very bad pain or a very bad infection. Surgery to remove your gallbladder. You may need this if: Gallstones keep coming back. You have very bad symptoms. Medicines to break up gallstones. Medicines: Are best for small gallstones. May be used for up to 6-12 months. A procedure to find and take out gallstones or to break up gallstones. Follow these instructions at home: Medicines Take over-the-counter and prescription medicines only as told by your doctor. If you were prescribed an antibiotic medicine, take it as told by your doctor. Do not stop taking the antibiotic even if you start to feel better. Ask your doctor if the medicine prescribed to you requires you to avoid driving or using machinery. Eating and drinking Drink enough fluid to keep your urine pale yellow. Drink water or clear  fluids. This is important when you have pain. Eat healthy foods. Choose: Fewer fatty foods, such as fried foods. Fewer refined carbs. Avoid breads and grains that are highly processed, such as white bread and white rice. Choose whole grains, such as whole-wheat bread and brown rice. More fiber. Almonds, fresh fruit, and beans are healthy sources. General instructions Keep a healthy weight. Keep all  follow-up visits as told by your doctor. This is important. Where to find more information [employer] of Diabetes and Digestive and Kidney Diseases: [email or web address] Contact a doctor if: You have sudden pain in the upper right part of your belly. Pain might spread to your right shoulder, back, or chest. You have been diagnosed with gallstones that have no symptoms and you get: Belly pain. Discomfort, burning, or fullness in the upper part of your abdomen. You have dark urine or pale stools. Get help right away if: You have sudden pain in the upper right part of your abdomen, and the pain lasts more than 2 hours. You have pain in your abdomen, and: It lasts more than 5 hours. It keeps getting worse. You have a fever or chills. You keep feeling like you may vomit. You keep vomiting. Your skin or the white parts of your eyes turn yellow. Summary Cholelithiasis happens when gallstones form in the gallbladder. This condition may be caused by a blood disease, too much of a fat-like substance in the bile, or not enough bile salts in bile. Treatment for this condition depends on how bad you feel. If you have symptoms, do not eat or drink. You may need medicines. You may need a hospital stay for very bad pain or a very bad infection. You may need surgery if gallstones keep coming back or if you have very bad symptoms. This information is not intended to replace advice given to you by your health care provider. Make sure you discuss any questions you have with your health care provider. Document Revised: 10/12/2019 Document Reviewed: 07/16/2019 Elsevier Patient Education  2022 [employer].

## 2021-09-18 ENCOUNTER — Telehealth: Payer: Self-pay

## 2021-09-18 DIAGNOSIS — K8050 Calculus of bile duct without cholangitis or cholecystitis without obstruction: Secondary | ICD-10-CM | POA: Insufficient documentation

## 2021-09-18 NOTE — Progress Notes (Signed)
Patient ID: Clifford Martin, male   DOB: 06/18/1941, 81 y.o.   MRN: [id number]  HPI Clifford Martin is a 81 y.o. male seen after he recently was admitted to the hospital for altered mental status and sepsis.  Retrospectively this was likely cholangitis. He did have altered mental status and was started on IV antibiotics.  He did have extensive work-up to include CT scan of the abdomen pelvis as well as ultrasound as well as MRCP, please note that I personally reviewed the images.  There was evidence of cholelithiasis with also choledocholithiasis. Does have evidence of coronary artery disease , DMand COPD. H his jaundice resolved spontaneously and he is mentation improved to me.  He was discharged on to biotics which she had completed.  Currently he reports no abdominal pain no fevers no chills.  He is back to baseline. CMP show mild ovation of AST and ALT with a total bilirubin of 1.8. CBC was completely normal. Is able to perform now more than 4 METS of activity without any shortness of breath or chest pain. He Did have a history of bilateral open inguinal hernia repair HPI  Past Medical History:  Diagnosis Date   CHF (congestive heart failure) ([place])    Diabetes mellitus without complication ([place])    Hyperlipidemia    Hypertension     Past Surgical History:  Procedure Laterality Date   CARDIAC SURGERY     HERNIA REPAIR      History reviewed. No pertinent family history.  Social History Social History   Tobacco Use   Smoking status: Unknown    Passive exposure: Never   Smokeless tobacco: Never  Vaping Use   Vaping Use: Never used  Substance Use Topics   Alcohol use: Not Currently   Drug use: Never    Allergies  Allergen Reactions   Ativan [Lorazepam] Other (See Comments)    Causes AMS    Current Outpatient Medications  Medication Sig Dispense Refill   aspirin 81 MG EC tablet Take 81 mg by mouth daily. Swallow whole.     atorvastatin (LIPITOR) 40 MG tablet Take  40 mg by mouth daily.     clopidogrel (PLAVIX) 75 MG tablet Take 75 mg by mouth daily.     furosemide (LASIX) 20 MG tablet Take 20 mg by mouth daily.     gabapentin (NEURONTIN) 300 MG capsule Take 300 mg by mouth 3 (three) times daily.     lisinopril (ZESTRIL) 5 MG tablet Take 5 mg by mouth 2 (two) times daily.     metFORMIN (GLUCOPHAGE) 1000 MG tablet Take 1,000 mg by mouth at bedtime.     methocarbamol (ROBAXIN) 500 MG tablet Take 500 mg by mouth 4 (four) times daily.     metoprolol tartrate (LOPRESSOR) 50 MG tablet Take 50 mg by mouth 2 (two) times daily.     morphine (MS CONTIN) 30 MG 12 hr tablet Take 30 mg by mouth 2 (two) times daily as needed.     naloxone (NARCAN) nasal spray 4 mg/0.1 mL Place 1 spray into the nose as needed. One spray in either nostril once for known/suspected overdose. May repeat every 2 to 3 minutes in alternating nostrils until EMS arrives.     traZODone (DESYREL) 50 MG tablet Take 50-150 mg by mouth at bedtime as needed.     No current facility-administered medications for this visit.     Review of Systems Full ROS  was asked and was negative except for the information  on the HPI  Physical Exam Blood pressure 120/67, pulse 77, temperature 98.3 F (36.8 C), temperature source Oral, height 5\' 10"  (1.778 m), weight 176 lb 9.6 oz (80.1 kg), SpO2 94 %. CONSTITUTIONAL: NAD. EYES: Pupils are equal, round, Sclera are non-icteric. EARS, NOSE, MOUTH AND THROAT: He is wearing a mask,Hearing is intact to voice. LYMPH NODES:  Lymph nodes in the neck are normal. RESPIRATORY:  Lungs are clear. There is normal respiratory effort, with equal breath sounds bilaterally, and without pathologic use of accessory muscles. CARDIOVASCULAR: Heart is regular without murmurs, gallops, or rubs. GI: The abdomen is  soft, nontender, and nondistended. There are no palpable masses. There is no hepatosplenomegaly. There are normal bowel sounds in all quadrants. GU: Rectal deferred.    MUSCULOSKELETAL: Normal muscle strength and tone. No cyanosis or edema.   SKIN: Turgor is good and there are no pathologic skin lesions or ulcers. NEUROLOGIC: Motor and sensation is grossly normal. Cranial nerves are grossly intact. PSYCH:  Oriented to person, place and time. Affect is normal.  Data Reviewed  I have personally reviewed the patient's imaging, laboratory findings and medical records.    Assessment/Plan 81 year old male with recent history of cholangitis that actually resolved with antibiotic therapy.  He still had a common bile duct stone on the MRCP.  We will make sure that we make an urgent referral to GI as an outpatient.  I have discussed this with Dr.Wohl. After he completes his ERCP we will bring him back and perform a cholecystectomy.  I have discussed with the patient in detail about his disease process. Will start being proactive and request cardiology preoperative optimization since he had a history of CHF and coronary artery disease. He is not jaundice and he is not septic and does not need emergent surgical intervention.   Please note that I spent greater than 60 minutes including extensive review of prior medical records , personally reviewing imaging studies, coordinating his care, counseling and performing appropriate documentation   Caroleen Hamman, MD FACS General Surgeon 09/18/2021, 12:59 PM

## 2021-09-18 NOTE — Telephone Encounter (Signed)
Schedule sooner appt per Dr Allen Norris from Dr Dahlia Byes

## 2021-09-21 NOTE — Telephone Encounter (Signed)
Pt's appt moved up to 1/26 at 1:45pm

## 2021-09-22 ENCOUNTER — Telehealth: Payer: Self-pay | Admitting: Surgery

## 2021-09-22 NOTE — Telephone Encounter (Signed)
Patients cardiac doctors office called and said they haven't seen the patient in over a year and he will need to come in for a visit before they give him the clearance.

## 2021-10-01 ENCOUNTER — Ambulatory Visit: Payer: PPO | Admitting: Gastroenterology

## 2021-10-01 NOTE — Progress Notes (Deleted)
Gastroenterology Consultation  Referring Provider:     [facility] Primary Care Physician:  [facility] Primary Gastroenterologist:  Dr. Allen Norris     Reason for Consultation:     Abnormal MRCP        HPI:   Clifford Martin is a 81 y.o. y/o male referred for consultation & management of abnormal MRCP by Dr. Berdine Addison, [place].  This patient comes to see me after being seen in the past by surgery and having an MRCP that showed:  IMPRESSION: 1. Numerous gallstones in the gallbladder. Suspect at least one tiny calculus or gravel in the central common bile duct, measuring no greater than 0.3 cm. There is however no biliary ductal dilatation, common bile duct measuring up to 0.6 cm. 2. No gallbladder wall thickening or pericholecystic fluid. 3. Cardiomegaly.  This procedure was done on August 31, 2021.  The patient was admitted to the hospital with sepsis and suspected cholangitis.  The patient was treated and discharged home with follow-up with me today.   Past Medical History:  Diagnosis Date   CHF (congestive heart failure) ([facility])    Diabetes mellitus without complication ([place])    Hyperlipidemia    Hypertension     Past Surgical History:  Procedure Laterality Date   CARDIAC SURGERY     HERNIA REPAIR      Prior to Admission medications   Medication Sig Start Date End Date Taking? Authorizing Provider  aspirin 81 MG EC tablet Take 81 mg by mouth daily. Swallow whole.    [provider]  atorvastatin (LIPITOR) 40 MG tablet Take 40 mg by mouth daily. 07/23/21   [provider]  clopidogrel (PLAVIX) 75 MG tablet Take 75 mg by mouth daily. 04/27/21 04/27/22  [provider]  furosemide (LASIX) 20 MG tablet Take 20 mg by mouth daily. 07/24/21   [provider]  gabapentin (NEURONTIN) 300 MG capsule Take 300 mg by mouth 3 (three) times daily. 07/24/21   [provider]  lisinopril (ZESTRIL) 5 MG  tablet Take 5 mg by mouth 2 (two) times daily. 04/28/21 04/28/22  [provider]  metFORMIN (GLUCOPHAGE) 1000 MG tablet Take 1,000 mg by mouth at bedtime. 04/22/21 04/17/22  [provider]  methocarbamol (ROBAXIN) 500 MG tablet Take 500 mg by mouth 4 (four) times daily.    [provider]  metoprolol tartrate (LOPRESSOR) 50 MG tablet Take 50 mg by mouth 2 (two) times daily. 12/09/20 12/09/21  [provider]  morphine (MS CONTIN) 30 MG 12 hr tablet Take 30 mg by mouth 2 (two) times daily as needed. 08/10/21   [provider]  naloxone [facility]) nasal spray 4 mg/0.1 mL Place 1 spray into the nose as needed. One spray in either nostril once for known/suspected overdose. May repeat every 2 to 3 minutes in alternating nostrils until EMS arrives. 04/22/21   [provider]  traZODone (DESYREL) 50 MG tablet Take 50-150 mg by mouth at bedtime as needed. 07/01/21   [provider]    No family history on file.   Social History   Tobacco Use   Smoking status: Unknown    Passive exposure: Never   Smokeless tobacco: Never  Vaping Use   Vaping Use: Never used  Substance Use Topics   Alcohol use: Not Currently   Drug use: Never    Allergies as of 10/01/2021 - Review Complete 09/16/2021  Allergen Reaction Noted  Ativan [lorazepam] Other (See Comments) 08/29/2021    Review of Systems:    All systems reviewed and negative except where noted in HPI.   Physical Exam:  There were no vitals taken for this visit. No LMP for male patient. General:   Alert,  Well-developed, ***well-nourished, pleasant and cooperative in NAD Head:  Normocephalic and atraumatic. Eyes:  Sclera clear, no icterus.   Conjunctiva pink. Ears:  Normal auditory acuity. Neck:  Supple; no masses or thyromegaly. Lungs:  Respirations even and unlabored.  Clear throughout to auscultation.   No wheezes, crackles, or rhonchi. No acute distress. Heart:  Regular rate and rhythm;  no murmurs, clicks, rubs, or gallops. Abdomen:  Normal bowel sounds.  No bruits.  Soft, non-tender and non-distended without masses, hepatosplenomegaly or hernias noted.  No guarding or rebound tenderness.  ***Negative Carnett sign.   Rectal:  Deferred.***  Pulses:  Normal pulses noted. Extremities:  No clubbing or edema.  No cyanosis. Neurologic:  Alert and oriented x3;  grossly normal neurologically. Skin:  Intact without significant lesions or rashes.  ***No jaundice. Lymph Nodes:  No significant cervical adenopathy. Psych:  Alert and cooperative. Normal mood and affect.  Imaging Studies: No results found.  Assessment and Plan:   KIYAAN HAQ is a 81 y.o. y/o male ***    Lucilla Lame, MD. [facility]    Note: This dictation was prepared with Dragon dictation along with smaller phrase technology. Any transcriptional errors that result from this process are unintentional.

## 2021-10-02 ENCOUNTER — Telehealth: Payer: Self-pay

## 2021-10-02 NOTE — Telephone Encounter (Signed)
Left message letting patient know we are trying to obtain Cardiac Clearance from Dr.Ross Simpson (fax [phone]) patient may need to call his office to see if he needs to be seen prior to cardiac clearance being signed off on.

## 2021-10-06 HISTORY — PX: ERCP: SHX60

## 2021-10-08 NOTE — Progress Notes (Signed)
Cardiac Clearance has been received from Dr Griffin Dakin office. The patient is cleared at Medium risk for surgery. He may stop his Plavix 7 days prior to surgery, and restart after.

## 2021-10-20 ENCOUNTER — Ambulatory Visit (INDEPENDENT_AMBULATORY_CARE_PROVIDER_SITE_OTHER): Payer: PPO | Admitting: Physician Assistant

## 2021-10-20 ENCOUNTER — Encounter: Payer: Self-pay | Admitting: Physician Assistant

## 2021-10-20 ENCOUNTER — Other Ambulatory Visit: Payer: Self-pay

## 2021-10-20 VITALS — BP 119/64 | HR 85 | Temp 98.4°F | Ht 70.0 in | Wt 178.8 lb

## 2021-10-20 DIAGNOSIS — K8050 Calculus of bile duct without cholangitis or cholecystitis without obstruction: Secondary | ICD-10-CM | POA: Diagnosis not present

## 2021-10-20 DIAGNOSIS — K429 Umbilical hernia without obstruction or gangrene: Secondary | ICD-10-CM

## 2021-10-20 NOTE — Progress Notes (Signed)
[facility] NOTE  10/21/2021  History of Present Illness: Clifford Martin is a 81 y.o. male following with our practice after recent admission for sepsis which retroactively appears to have been secondary to cholangitis. Since his last office visit on 01/11 with Dr Dahlia Byes, he has gone to [facility] for ERCP with Dr Lysle Rubens, which was positive for choledocholithiasis. I have reviewed this report. He presents to the office today for follow up and to discuss cholecystectomy.   Patient reports that he is doing much better and feels that he has returned to his baseline. He is no longer having any abdominal pain. No fever, chills, CP, SOB, nausea, emesis, diarrhea, acholic stools, itching, no juandice. He is able to tolerate PO without any issues. E does have a significant cardiac history and is on Plavix. He was evaluated by his cardiologist (Dr Moshe Cipro) and was given clearance for surgery and will plan to stop Plavix 7 days prior. He can preform most ADLs without SOB. Only previous intra-abdominal surgery is laparoscopic inguinal hernia repair (bilateral). He is interested in pursuing cholecystectomy to prevent recurrence of choledocholithiasis.   Past Medical History: Past Medical History:  Diagnosis Date   CHF (congestive heart failure) ([place])    Diabetes mellitus without complication ([place])    Hyperlipidemia    Hypertension      Past Surgical History: Past Surgical History:  Procedure Laterality Date   CARDIAC SURGERY     HERNIA REPAIR      Home Medications: Prior to Admission medications   Medication Sig Start Date End Date Taking? Authorizing Provider  aspirin 81 MG EC tablet Take 81 mg by mouth daily. Swallow whole.   Yes [provider]  atorvastatin (LIPITOR) 40 MG tablet Take 40 mg by mouth daily. 07/23/21  Yes [provider]  clopidogrel (PLAVIX) 75 MG tablet Take 75 mg by mouth daily. 04/27/21 04/27/22 Yes [provider]   furosemide (LASIX) 20 MG tablet Take 20 mg by mouth daily. 07/24/21  Yes [provider]  gabapentin (NEURONTIN) 300 MG capsule Take 300 mg by mouth 3 (three) times daily. 07/24/21  Yes [provider]  lisinopril (ZESTRIL) 5 MG tablet Take 5 mg by mouth 2 (two) times daily. 04/28/21 04/28/22 Yes [provider]  metFORMIN (GLUCOPHAGE) 1000 MG tablet Take 1,000 mg by mouth at bedtime. 04/22/21 04/17/22 Yes [provider]  methocarbamol (ROBAXIN) 500 MG tablet Take 500 mg by mouth 4 (four) times daily.   Yes [provider]  metoprolol tartrate (LOPRESSOR) 50 MG tablet Take 50 mg by mouth 2 (two) times daily. 12/09/20 12/09/21 Yes [provider]  morphine (MS CONTIN) 30 MG 12 hr tablet Take 30 mg by mouth 2 (two) times daily as needed. 08/10/21  Yes [provider]  naloxone (NARCAN) nasal spray 4 mg/0.1 mL Place 1 spray into the nose as needed. One spray in either nostril once for known/suspected overdose. May repeat every 2 to 3 minutes in alternating nostrils until EMS arrives. 04/22/21  Yes [provider]  traZODone (DESYREL) 50 MG tablet Take 50-150 mg by mouth at bedtime as needed. 07/01/21  Yes [provider]    Allergies: Allergies  Allergen Reactions   Ativan [Lorazepam] Other (See Comments)    Causes AMS   Nitroglycerin Other (See Comments)    Blood pressure too low    Review of Systems: Review of Systems  Constitutional:  Negative for chills and fever.  HENT:  Negative for congestion  and sore throat.   Respiratory:  Negative for cough and shortness of breath.   Cardiovascular:  Negative for chest pain and palpitations.  Gastrointestinal:  Negative for abdominal pain, diarrhea, nausea and vomiting.  Genitourinary:  Negative for dysuria and urgency.  All other systems reviewed and are negative.  Physical Exam BP 119/64    Pulse 85    Temp 98.4 F (36.9 C) (Oral)    Ht 5\' 10"  (1.778 m)    Wt 178 lb  12.8 oz (81.1 kg)    SpO2 96%    BMI 25.66 kg/m   Physical Exam Vitals and nursing note reviewed. Exam conducted with a chaperone present.  Constitutional:      General: He is not in acute distress.    Appearance: Normal appearance. He is normal weight. He is not ill-appearing.  HENT:     Head: Normocephalic and atraumatic.  Eyes:     General: No scleral icterus.    Conjunctiva/sclera: Conjunctivae normal.  Cardiovascular:     Rate and Rhythm: Normal rate and regular rhythm.     Pulses: Normal pulses.     Heart sounds: No murmur heard. Pulmonary:     Effort: Pulmonary effort is normal. No respiratory distress.  Abdominal:     General: Abdomen is flat. There is no distension.     Tenderness: There is no abdominal tenderness. There is no guarding or rebound.     Hernia: A hernia is present. Hernia is present in the umbilical area.     Comments: Abdomen is soft, he does have a small umbilical hernia containing fat (reducible), no appreciable tenderness, non-distended, no rebound/guarding  Genitourinary:    Comments: Deferred Musculoskeletal:     Right lower leg: No edema.     Left lower leg: No edema.  Skin:    General: Skin is warm and dry.     Coloration: Skin is not jaundiced or pale.     Findings: No erythema.  Neurological:     General: No focal deficit present.     Mental Status: He is alert and oriented to person, place, and time.  Psychiatric:        Mood and Affect: Mood normal.        Behavior: Behavior normal.    Labs/Imaging: ERCP (01/31) - [facility] Dr Lysle Rubens Impression:             - The major papilla appeared normal. - Choledocholithiasis was found. Complete removal was accomplished by biliary sphincterotomy and balloon extraction. - Numerous gallstones and cystic duct stones. - No specimens collected.   Assessment and Plan: This is a 81 y.o. 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 We reviewed his ERCP again together. I explained the role for cholecystectomy in this setting to prevent recurrence, especially since he did likely have cholangitis. Patient voiced understanding and is agreeable with proceeding. Will plan for robotic assisted laparoscopic cholecystectomy with umbilical hernia repair with Dr Dahlia Byes pending his availability - We have received cardiac clearance from his cardiologist. Deemed medium risk. Patient understands he will need to stop Plavix 7 days prior to surgery.  - All risks, benefits, and alternatives to above procedure(s) were discussed with the patient, all of his questions were answered  to his expressed satisfaction, patient expresses he wishes to proceed, and informed consent was obtained.   Face-to-face time spent with the patient and care providers was 30 minutes, with more than 50% of the time spent counseling, educating, and coordinating care of the patient.     Edison Simon, PA-C [facility] 10/21/2021, 9:14 AM [phone] M-F: 7am - 4pm

## 2021-10-20 NOTE — H&P (View-Only) (Signed)
[facility] SURGICAL CLINIC NOTE  10/21/2021  History of Present Illness: Clifford Martin is a 81 y.o. male following with our practice after recent admission for sepsis which retroactively appears to have been secondary to cholangitis. Since his last office visit on 01/11 with Dr Dahlia Byes, he has gone to [facility] for ERCP with Dr Lysle Rubens, which was positive for choledocholithiasis. I have reviewed this report. He presents to the office today for follow up and to discuss cholecystectomy.   Patient reports that he is doing much better and feels that he has returned to his baseline. He is no longer having any abdominal pain. No fever, chills, CP, SOB, nausea, emesis, diarrhea, acholic stools, itching, no juandice. He is able to tolerate PO without any issues. E does have a significant cardiac history and is on Plavix. He was evaluated by his cardiologist (Dr Moshe Cipro) and was given clearance for surgery and will plan to stop Plavix 7 days prior. He can preform most ADLs without SOB. Only previous intra-abdominal surgery is laparoscopic inguinal hernia repair (bilateral). He is interested in pursuing cholecystectomy to prevent recurrence of choledocholithiasis.   Past Medical History: Past Medical History:  Diagnosis Date   CHF (congestive heart failure) ([place])    Diabetes mellitus without complication ([place])    Hyperlipidemia    Hypertension      Past Surgical History: Past Surgical History:  Procedure Laterality Date   CARDIAC SURGERY     HERNIA REPAIR      Home Medications: Prior to Admission medications   Medication Sig Start Date End Date Taking? Authorizing Provider  aspirin 81 MG EC tablet Take 81 mg by mouth daily. Swallow whole.   Yes [provider]  atorvastatin (LIPITOR) 40 MG tablet Take 40 mg by mouth daily. 07/23/21  Yes [provider]  clopidogrel (PLAVIX) 75 MG tablet Take 75 mg by mouth daily. 04/27/21 04/27/22 Yes [provider]   furosemide (LASIX) 20 MG tablet Take 20 mg by mouth daily. 07/24/21  Yes [provider]  gabapentin (NEURONTIN) 300 MG capsule Take 300 mg by mouth 3 (three) times daily. 07/24/21  Yes [provider]  lisinopril (ZESTRIL) 5 MG tablet Take 5 mg by mouth 2 (two) times daily. 04/28/21 04/28/22 Yes [provider]  metFORMIN (GLUCOPHAGE) 1000 MG tablet Take 1,000 mg by mouth at bedtime. 04/22/21 04/17/22 Yes [provider]  methocarbamol (ROBAXIN) 500 MG tablet Take 500 mg by mouth 4 (four) times daily.   Yes [provider]  metoprolol tartrate (LOPRESSOR) 50 MG tablet Take 50 mg by mouth 2 (two) times daily. 12/09/20 12/09/21 Yes [provider]  morphine (MS CONTIN) 30 MG 12 hr tablet Take 30 mg by mouth 2 (two) times daily as needed. 08/10/21  Yes [provider]  naloxone (NARCAN) nasal spray 4 mg/0.1 mL Place 1 spray into the nose as needed. One spray in either nostril once for known/suspected overdose. May repeat every 2 to 3 minutes in alternating nostrils until EMS arrives. 04/22/21  Yes [provider]  traZODone (DESYREL) 50 MG tablet Take 50-150 mg by mouth at bedtime as needed. 07/01/21  Yes [provider]    Allergies: Allergies  Allergen Reactions   Ativan [Lorazepam] Other (See Comments)    Causes AMS   Nitroglycerin Other (See Comments)    Blood pressure too low    Review of Systems: Review of Systems  Constitutional:  Negative for chills and fever.  HENT:  Negative for congestion  and sore throat.   Respiratory:  Negative for cough and shortness of breath.   Cardiovascular:  Negative for chest pain and palpitations.  Gastrointestinal:  Negative for abdominal pain, diarrhea, nausea and vomiting.  Genitourinary:  Negative for dysuria and urgency.  All other systems reviewed and are negative.  Physical Exam BP 119/64    Pulse 85    Temp 98.4 F (36.9 C) (Oral)    Ht 5\' 10"  (1.778 m)    Wt 178 lb  12.8 oz (81.1 kg)    SpO2 96%    BMI 25.66 kg/m   Physical Exam Vitals and nursing note reviewed. Exam conducted with a chaperone present.  Constitutional:      General: He is not in acute distress.    Appearance: Normal appearance. He is normal weight. He is not ill-appearing.  HENT:     Head: Normocephalic and atraumatic.  Eyes:     General: No scleral icterus.    Conjunctiva/sclera: Conjunctivae normal.  Cardiovascular:     Rate and Rhythm: Normal rate and regular rhythm.     Pulses: Normal pulses.     Heart sounds: No murmur heard. Pulmonary:     Effort: Pulmonary effort is normal. No respiratory distress.  Abdominal:     General: Abdomen is flat. There is no distension.     Tenderness: There is no abdominal tenderness. There is no guarding or rebound.     Hernia: A hernia is present. Hernia is present in the umbilical area.     Comments: Abdomen is soft, he does have a small umbilical hernia containing fat (reducible), no appreciable tenderness, non-distended, no rebound/guarding  Genitourinary:    Comments: Deferred Musculoskeletal:     Right lower leg: No edema.     Left lower leg: No edema.  Skin:    General: Skin is warm and dry.     Coloration: Skin is not jaundiced or pale.     Findings: No erythema.  Neurological:     General: No focal deficit present.     Mental Status: He is alert and oriented to person, place, and time.  Psychiatric:        Mood and Affect: Mood normal.        Behavior: Behavior normal.    Labs/Imaging: ERCP (01/31) - [facility] Dr Lysle Rubens Impression:             - The major papilla appeared normal. - Choledocholithiasis was found. Complete removal was accomplished by biliary sphincterotomy and balloon extraction. - Numerous gallstones and cystic duct stones. - No specimens collected.   Assessment and Plan: This is a 81 y.o. 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 We reviewed his ERCP again together. I explained the role for cholecystectomy in this setting to prevent recurrence, especially since he did likely have cholangitis. Patient voiced understanding and is agreeable with proceeding. Will plan for robotic assisted laparoscopic cholecystectomy with umbilical hernia repair with Dr Dahlia Byes pending his availability - We have received cardiac clearance from his cardiologist. Deemed medium risk. Patient understands he will need to stop Plavix 7 days prior to surgery.  - All risks, benefits, and alternatives to above procedure(s) were discussed with the patient, all of his questions were answered  to his expressed satisfaction, patient expresses he wishes to proceed, and informed consent was obtained.   Face-to-face time spent with the patient and care providers was 30 minutes, with more than 50% of the time spent counseling, educating, and coordinating care of the patient.     Edison Simon, PA-C [facility] 10/21/2021, 9:14 AM [phone] M-F: 7am - 4pm

## 2021-10-20 NOTE — Patient Instructions (Addendum)
Our surgery scheduler Pamala Hurry will call you within 24-48 hours to get you scheduled. If you have not heard from her after 48 hours, please call our office. You will not need to get Covid tested before surgery and have the blue sheet available when she calls to write down important information.   If you have any concerns or questions, please feel free to call our office.    Please stop Plavis 7 days prior to surgery.  Minimally Invasive Cholecystectomy Minimally invasive cholecystectomy is surgery to remove the gallbladder. The gallbladder is a pear-shaped organ that lies beneath the liver on the right side of the body. The gallbladder stores bile, which is a fluid that helps the body digest fats. Cholecystectomy is often done to treat inflammation (irritation and swelling) of the gallbladder (cholecystitis). This condition is usually caused by a buildup of gallstones (cholelithiasis) in the gallbladder or when the fluid in the gall bladder becomes stagnant because gallstones get stuck in the ducts (tubes) and block the flow of bile. This can result in inflammation and pain. In severe cases, emergency surgery may be required. This procedure is done through small incisions in the abdomen, instead of one large incision. It is also called laparoscopic surgery. A thin scope with a camera (laparoscope) is inserted through one incision. Then surgical instruments are inserted through the other incisions. In some cases, a minimally invasive surgery may need to be changed to a surgery that is done through a larger incision. This is called open surgery. Tell a health care provider about: Any allergies you have. All medicines you are taking, including vitamins, herbs, eye drops, creams, and over-the-counter medicines. Any problems you or family members have had with anesthetic medicines. Any bleeding problems you have. Any surgeries you have had. Any medical conditions you have. Whether you are pregnant or may  be pregnant. What are the risks? Generally, this is a safe procedure. However, problems may occur, including: Infection. Bleeding. Allergic reactions to medicines. Damage to nearby structures or organs. A gallstone remaining in the common bile duct. The common bile duct carries bile from the gallbladder to the small intestine. A bile leak from the liver or cystic duct after your gallbladder is removed. What happens before the procedure? When to stop eating and drinking Follow instructions from your health care provider about what you may eat and drink before your procedure. These may include: 8 hours before the procedure Stop eating most foods. Do not eat meat, fried foods, or fatty foods. Eat only light foods, such as toast or crackers. All liquids are okay except energy drinks and alcohol. 6 hours before the procedure Stop eating. Drink only clear liquids, such as water, clear fruit juice, black coffee, plain tea, and sports drinks. Do not drink energy drinks or alcohol. 2 hours before the procedure Stop drinking all liquids. You may be allowed to take medicines with small sips of water. If you do not follow your health care provider's instructions, your procedure may be delayed or canceled. Medicines Ask your health care provider about: Changing or stopping your regular medicines. This is especially important if you are taking diabetes medicines or blood thinners. Taking medicines such as aspirin and ibuprofen. These medicines can thin your blood. Do not take these medicines unless your health care provider tells you to take them. Taking over-the-counter medicines, vitamins, herbs, and supplements. General instructions If you will be going home right after the procedure, plan to have a responsible adult: Take you home from  the hospital or clinic. You will not be allowed to drive. Care for you for the time you are told. Do not use any products that contain nicotine or tobacco for  at least 4 weeks before the procedure. These products include cigarettes, chewing tobacco, and vaping devices, such as e-cigarettes. If you need help quitting, ask your health care provider. Ask your health care provider: How your surgery site will be marked. What steps will be taken to help prevent infection. These may include: Removing hair at the surgery site. Washing skin with a germ-killing soap. Taking antibiotic medicine. What happens during the procedure?  An IV will be inserted into one of your veins. You will be given one or both of the following: A medicine to help you relax (sedative). A medicine to make you fall asleep (general anesthetic). Your surgeon will make several small incisions in your abdomen. The laparoscope will be inserted through one of the small incisions. The camera on the laparoscope will send images to a monitor in the operating room. This lets your surgeon see inside your abdomen. A gas will be pumped into your abdomen. This will expand your abdomen to give the surgeon more room to perform the surgery. Other tools that are needed for the procedure will be inserted through the other incisions. The gallbladder will be removed through one of the incisions. Your common bile duct may be examined. If stones are found in the common bile duct, they may be removed. After your gallbladder has been removed, the incisions will be closed with stitches (sutures), staples, or skin glue. Your incisions will be covered with a bandage (dressing). The procedure may vary among health care providers and hospitals. What happens after the procedure? Your blood pressure, heart rate, breathing rate, and blood oxygen level will be monitored until you leave the hospital or clinic. You will be given medicines as needed to control your pain. You may have a drain placed in the incision. The drain will be removed a day or two after the procedure. Summary Minimally invasive cholecystectomy,  also called laparoscopic cholecystectomy, is surgery to remove the gallbladder using small incisions. Tell your health care provider about all the medical conditions you have and all the medicines you are taking for those conditions. Before the procedure, follow instructions about when to stop eating and drinking and changing or stopping medicines. Plan to have a responsible adult care for you for the time you are told after you leave the hospital or clinic. This information is not intended to replace advice given to you by your health care provider. Make sure you discuss any questions you have with your health care provider. Document Revised: 02/24/2021 Document Reviewed: 02/24/2021 Elsevier Patient Education  [place].

## 2021-10-21 ENCOUNTER — Telehealth: Payer: Self-pay | Admitting: Surgery

## 2021-10-21 ENCOUNTER — Encounter: Payer: Self-pay | Admitting: Physician Assistant

## 2021-10-21 NOTE — Telephone Encounter (Signed)
Patient calls back, he is now aware of all dates regarding his surgery and also reminded to stop his Plavis 7 days prior to surgery. Patient verbalized understanding.

## 2021-10-21 NOTE — Telephone Encounter (Signed)
Outgoing call is made, left message for patient to call.  Please inform of the following regarding scheduled surgery:   Pre-Admission date/time, COVID Testing date and Surgery date.  Surgery Date: 10/29/21 Preadmission Testing Date: 10/23/21 (phone 8a-1p) Covid Testing Date: Not needed.     Also patient will need to call at [phone], between 1-3:00pm the day before surgery, to find out what time to arrive for surgery.    Reminder: Patient will also need to stop Plavix 7 days prior to surgery.

## 2021-10-23 ENCOUNTER — Other Ambulatory Visit: Payer: Self-pay

## 2021-10-23 ENCOUNTER — Encounter
Admission: RE | Admit: 2021-10-23 | Discharge: 2021-10-23 | Disposition: A | Discharge status: Home / Self Care | Payer: PPO | Source: Ambulatory Visit | Attending: Surgery | Admitting: Surgery

## 2021-10-23 ENCOUNTER — Telehealth: Payer: Self-pay | Admitting: Surgery

## 2021-10-23 DIAGNOSIS — Z79899 Other long term (current) drug therapy: Secondary | ICD-10-CM

## 2021-10-23 HISTORY — DX: Dyspnea, unspecified: R06.00

## 2021-10-23 HISTORY — DX: Ischemic cardiomyopathy: I25.5

## 2021-10-23 HISTORY — DX: Atherosclerotic heart disease of native coronary artery without angina pectoris: I25.10

## 2021-10-23 HISTORY — DX: Sleep apnea, unspecified: G47.30

## 2021-10-23 HISTORY — DX: Unspecified osteoarthritis, unspecified site: M19.90

## 2021-10-23 HISTORY — DX: Sepsis, unspecified organism: A41.9

## 2021-10-23 HISTORY — DX: Anemia, unspecified: D64.9

## 2021-10-23 HISTORY — DX: Gastro-esophageal reflux disease without esophagitis: K21.9

## 2021-10-23 NOTE — Telephone Encounter (Signed)
Updated information regarding rescheduled surgery at doctor's request.    Pre-Admission date/time, COVID Testing date and Surgery date.  Surgery Date: 10/30/21 Preadmission Testing Date: 10/23/21 (phone 8a-1p) Covid Testing Date: Not needed.     Patient has been made aware to call [phone], between 1-3:00pm the day before surgery, to find out what time to arrive for surgery.

## 2021-10-23 NOTE — Patient Instructions (Addendum)
Your procedure is scheduled on: Friday ,February 24 Report to the Registration Desk on the 1st floor of the [facility].Then proceed to the 2nd floor Surgery Desk in the [place] To find out your arrival time, please call [phone] between 1PM - 3PM on: Thursday, February 23  REMEMBER: Instructions that are not followed completely may result in serious medical risk, up to and including death; or upon the discretion of your surgeon and anesthesiologist your surgery may need to be rescheduled.  Do not eat food after midnight the night before surgery.  No gum chewing, lozengers or hard candies.  You may however, drink water up to 2 hours before you are scheduled to arrive for your surgery. Do not drink anything within 2 hours of your scheduled arrival time.  TAKE THESE MEDICATIONS THE MORNING OF SURGERY WITH A SIP OF WATER: -gabapentin (NEURONTIN) -metoprolol tartrate (LOPRESSOR) -morphine (MS CONTIN)  -famotidine (PEPCID)-take one the night before surgery and another one the morning of surgery  Use your  albuterol (VENTOLIN HFA) 108 (90 Base) MCG/ACT inhaler on the day of surgery and bring to the hospital.  Stop metformin 2 days prior to surgery. Last day to take is 2-21 Tuesday.   According to Dr. Griffin Dakin note: stop plavix 7 days prior to surgery-Last dose of Plavix per patient was on 10-19-21 (Monday) Continue taking 81 mg Aspirin up until the day prior to surgery-Do NOT take the day of surgery  One week prior to surgery: starting February 16 Stop Anti-inflammatories (NSAIDS) such as Advil, Aleve, Ibuprofen, Motrin, Naproxen, Naprosyn and Aspirin based products such as Excedrin, Goodys Powder, BC Powder.You may however, take Tylenol if needed for pain up until the day of surgery.  Stop ANY OVER THE COUNTER supplements/vitamins NOW (10-23-21) until after surgery (Multivitamin)  No Alcohol for 24 hours before or after surgery.  No Smoking including e-cigarettes for 24 hours  prior to surgery.  No chewable tobacco products for at least 6 hours prior to surgery.  No nicotine patches on the day of surgery.  Do not use any "recreational" drugs for at least a week prior to your surgery.  Please be advised that the combination of cocaine and anesthesia may have negative outcomes, up to and including death. If you test positive for cocaine, your surgery will be cancelled.  On the morning of surgery brush your teeth with toothpaste and water, you may rinse your mouth with mouthwash if you wish. Do not swallow any toothpaste or mouthwash.  Use CHG Soap as directed on instruction sheet.  Do not wear jewelry, make-up, hairpins, clips or nail polish.  Do not wear lotions, powders, or perfumes.   Do not shave body from the neck down 48 hours prior to surgery just in case you cut yourself which could leave a site for infection.  Also, freshly shaved skin may become irritated if using the CHG soap.  Contact lenses, hearing aids and dentures may not be worn into surgery.  Do not bring valuables to the hospital. [facility] is not responsible for any missing/lost belongings or valuables.   Notify your doctor if there is any change in your medical condition (cold, fever, infection).  Wear comfortable clothing (specific to your surgery type) to the hospital.  After surgery, you can help prevent lung complications by doing breathing exercises.  Take deep breaths and cough every 1-2 hours. Your doctor may order a device called an Incentive Spirometer to help you take deep breaths. When coughing or sneezing,  hold a pillow firmly against your incision with both hands. This is called splinting. Doing this helps protect your incision. It also decreases belly discomfort.  If you are being discharged the day of surgery, you will not be allowed to drive home. You will need a responsible adult (18 years or older) to drive you home and stay with you that night.   If you are  taking public transportation, you will need to have a responsible adult (18 years or older) with you. Please confirm with your physician that it is acceptable to use public transportation.   Please call the [facility] at [phone] if you have any questions about these instructions.  Surgery Visitation Policy:  Patients undergoing a surgery or procedure may have one family member or support person with them as long as that person is not COVID-19 positive or experiencing its symptoms.  That person may remain in the waiting area during the procedure and may rotate out with other people.

## 2021-10-23 NOTE — Addendum Note (Signed)
Addended by: Caroleen Hamman F on: 10/23/2021 10:51 AM   Modules accepted: Orders

## 2021-10-26 ENCOUNTER — Other Ambulatory Visit: Payer: Self-pay

## 2021-10-26 ENCOUNTER — Encounter
Admission: RE | Admit: 2021-10-26 | Discharge: 2021-10-26 | Disposition: A | Discharge status: Home / Self Care | Payer: PPO | Source: Ambulatory Visit | Attending: Surgery | Admitting: Surgery

## 2021-10-26 ENCOUNTER — Encounter: Payer: Self-pay | Admitting: Surgery

## 2021-10-26 ENCOUNTER — Ambulatory Visit: Payer: PPO | Admitting: Surgery

## 2021-10-26 DIAGNOSIS — Z79899 Other long term (current) drug therapy: Secondary | ICD-10-CM | POA: Insufficient documentation

## 2021-10-26 DIAGNOSIS — Z01812 Encounter for preprocedural laboratory examination: Secondary | ICD-10-CM | POA: Insufficient documentation

## 2021-10-26 LAB — POTASSIUM: Potassium: 4.1 mmol/L (ref 3.5–5.1)

## 2021-10-26 NOTE — Progress Notes (Signed)
Perioperative Services  Pre-Admission/Anesthesia Testing Clinical Review  Date: 10/26/21  Patient Demographics:  Name: Clifford Martin DOB:   [birth year] MRN:   [id number]  Planned Surgical Procedure(s):    Case: [id number] Date/Time: 10/30/21 1530   Procedures:      XI ROBOTIC ASSISTED LAPAROSCOPIC CHOLECYSTECTOMY     INDOCYANINE GREEN FLUORESCENCE IMAGING (ICG)   Anesthesia type: General   Pre-op diagnosis: choledocholithiasis   Location: [facility] OR ROOM 04 / [facility]   Surgeons: Jules Husbands, MD   NOTE: Available PAT nursing documentation and vital signs have been reviewed. Clinical nursing staff has updated patient's PMH/PSHx, current medication list, and drug allergies/intolerances to ensure comprehensive history available to assist in medical decision making as it pertains to the aforementioned surgical procedure and anticipated anesthetic course. Extensive review of available clinical information performed. Clifford Martin PMH and PSHx updated with any diagnoses/procedures that  may have been inadvertently omitted during his intake with the pre-admission testing department's nursing staff.  Clinical Discussion:  Clifford Martin is a 81 y.o. male who is submitted for pre-surgical anesthesia review and clearance prior to him undergoing the above procedure. Patient is a Former Smoker (70 pack years; quit 09/2000). Pertinent PMH includes: CAD (s/p CABG), NSTEMI x 2, ischemic cardiomyopathy, CHF, postoperative atrial flutter, IRBBB, HTN, HLD, T2DM, dyspnea, COPD, OSAH (does not require nocturnal PAP therapy), GERD (on daily H2 blocker), anemia, OA.  Patient is followed by cardiology Moshe Cipro, MD). He was last seen in the cardiology clinic on 09/23/2021; notes reviewed.  At the time of his clinic visit, patient doing well overall from a cardiovascular perspective.  He denied any episodes of chest pain, however reports chronic exertional dyspnea related to his underlying  COPD diagnosis; reported to be at baseline.  Patient with a past medical history significant for cardiovascular diagnoses.  Patient suffered an NSTEMI on 11/14/2005.  Diagnostic left heart catheterization performed on 11/15/2005 revealed multivessel CAD; 100% RCA, 100% OM2, 75% ramus intermedius and 75-95% LAD.  Patient was referred to CVTS for consultation regarding possible CABG procedure.  Patient subsequently underwent three-vessel CABG procedure on 11/19/2015.  LIMA-LAD, SVG-OM 2, and SVG-RCA bypass grafts were placed.  Intraoperatively, patient was noted to have severe pericarditis.  His LAD and OM were noted to be intramyocardial.  He required higher doses of epinephrine and milrinone to come off bypass.  Patient was extubated on POD-1, however following extubation he developed respiratory distress and atrial flutter.  Patient required DCCV x1 delivering a single 200 J shock which restored NSR.  Respiratory distress persisted following DCCV.  Patient was reintubated for stabilization.  He was ultimately weaned from the ventilator on POD-4.   Patient suffered a second NSTEMI in 2009.  Diagnostic left heart catheterization performed revealing a 90% stenosis of the ramus intermedius.  PCI subsequently performed placing a 2.5 x 12 mm Microdriver BMS to the ramus intermedius.  Diagnostic left heart catheterization performed on 02/01/2014 revealing a patent LIMA-LAD bypass graft.  SVG-OM 2 bypass graft was patent with a diffuse 60-70% segmental stenosis of the ostial to proximal SVG.  SVG-RCA bypass graft was totally occluded.  Additionally, there was multivessel CAD; 70% proximal LAD, 80-90% mid LAD, 95% proximal RCA, and total occlusion of the mid RCA.  PCI was subsequently performed placing a 2.25 x 12 mm Promus Premier DES to the distal LAD and a 2.75 x 20 mm Promus Premier DES to the proximal LAD resulting in 0% residual stenosis and TIMI-3 flow.  Diagnostic left heart catheterization was performed on  12/23/2015 revealing severe native vessel CAD.  There was complete occlusion of the mid LCx and mid RCA.  SVG-OM and SVG-RCA bypass grafts were also occluded.  Previously placed stents to the LAD were patent.  Plans were for a staged PCI, which patient underwent on 12/31/2015.  3.5 x 16 mm Promus Premier DES placed to the proximal RCA and 2.5 x 32 mm Promus Premier placed to the mid RCA resulting in 0% residual stenosis and TIMI-3 flow.  Myocardial perfusion imaging study performed on 11/30/2017 revealed a mildly reduced left ventricular ejection fraction of 41%.  EKG showed normal sinus rhythm with poor R wave progression and inferior Q waves; occasional PVCs.  There was no significant ST segment depression noted during stress.  There was a large severe fixed defect involving the basal inferior, mid inferior, apical inferior, apical lateral, mid inferolateral, and basal inferolateral segments.  Findings consistent with infarction involving the RCA and LCx.  Study determined to be abnormal.  Last cardiac catheterization was performed on 08/31/2018 revealing an atretic LIMA-LAD bypass graft.  SVG-OM and SVG-RCA bypass grafts were occluded.  There was occlusion of the LCx, 50% stenosis of the mid LAD, 60% stenosis of D2, 90% stenosis of the RPLB and also a 50% stenosis of the LEFT subclavian artery.  PCI was subsequently performed placing a 2.5 x 30 mm Resolute Onyx DES to the RPLB resulting in 0% residual stenosis and TIMI-3 flow.    TTE performed on 08/31/2021 revealed a mildly decreased left ventricular systolic function with an EF of 45-50%.  Mild inferior hypokinesis noted.  Diastolic Doppler parameters consistent with impaired relaxation (G1DD).  There was mild to moderate mitral valve regurgitation.  There was no significant transvalvular gradient to suggest valvular stenosis.  Given patient's history of severe cardiac disease resulting in CABG and placement of multiple coronary stents, patient remains  on daily DAPT therapy (ASA + clopidogrel).  Patient reported to be compliant with therapy with no evidence or reports of GI bleeding.  Blood pressure well controlled at 127/57 on currently prescribed diuretic, ACEi, and beta-blocker therapies.  He is on a statin for his HLD.  T2DM well-controlled on currently prescribed regimen; last HgbA1c was 5.5% when checked on 08/24/2021. Functional capacity, as defined by DASI, is documented as being >/= 4 METS.  No changes were made to patient's medication regimen.  Patient follow-up with outpatient cardiology in 3 months or sooner if needed.  Clifford Martin is scheduled for an ROBOTIC ASSISTED LAPAROSCOPIC CHOLECYSTECTOMY on 10/29/2021 with Dr. Caroleen Hamman, MD.  Given patient's past medical history significant for cardiovascular diagnoses, presurgical cardiac clearance was sought by the performing surgeon's office and PAT team. Per cardiology, "this patient is optimized for surgery and may proceed with the planned procedural course with a MODERATE risk of significant perioperative cardiovascular complications".  Again, this patient is on daily DAPT therapy.  He has been cleared by his cardiologist to hold his clopidogrel for 7 days prior to his procedure with plans to restart as soon as postoperative bleeding risk felt to be minimized by his primary attending surgeon.  Patient is aware that his last dose of clopidogrel should be on 10/21/2021.  Cardiology asking that patient continue the daily low-dose ASA throughout his perioperative course.  Patient denies previous perioperative complications with anesthesia in the past. In review of the available records, it is noted that patient underwent a general anesthetic course at [facility] (ASA III) in  09/2021 without documented complications.   Vitals with BMI 10/20/2021 09/16/2021 09/01/2021  Height 5\' 10"  5\' 10"  -  Weight 178 lbs 13 oz 176 lbs 10 oz -  BMI 66.06 30.16 -  Systolic 010 932 99  Diastolic 64 67 59   Pulse 85 77 76    Providers/Specialists:   NOTE: Primary physician provider listed below. Patient may have been seen by APP or partner within same practice.   PROVIDER ROLE / SPECIALTY LAST Suszanne Finch, MD General Surgery (Surgeon) 10/20/2021  Trish Mage [facility] Provider 08/24/2021  Hunt Oris, MD Cardiology 09/23/2021   Allergies:  Ativan [lorazepam] and Nitroglycerin  Current Home Medications:   No current facility-administered medications for this encounter.    albuterol (VENTOLIN HFA) 108 (90 Base) MCG/ACT inhaler   aspirin 81 MG EC tablet   atorvastatin (LIPITOR) 40 MG tablet   furosemide (LASIX) 20 MG tablet   gabapentin (NEURONTIN) 300 MG capsule   ibuprofen (ADVIL) 200 MG tablet   lisinopril (ZESTRIL) 5 MG tablet   metFORMIN (GLUCOPHAGE) 1000 MG tablet   methocarbamol (ROBAXIN) 500 MG tablet   metoprolol tartrate (LOPRESSOR) 50 MG tablet   metroNIDAZOLE (METROCREAM) 0.75 % cream   morphine (MS CONTIN) 30 MG 12 hr tablet   Multiple Vitamin (MULTIVITAMIN WITH MINERALS) TABS tablet   naloxone (NARCAN) nasal spray 4 mg/0.1 mL   traZODone (DESYREL) 50 MG tablet   clopidogrel (PLAVIX) 75 MG tablet   famotidine (PEPCID) 20 MG tablet   History:   Past Medical History:  Diagnosis Date   Anemia    Arthritis    CHF (congestive heart failure) (HCC)    COPD (chronic obstructive pulmonary disease) (HCC)    Coronary artery disease 11/14/2005   a.) NSTEMI 11/14/05 -> LHC 11/15/05: 100% RCA, 100% OM2, 75% RI, 75-95% LAD; consult CVTS. b.) 3v CABG 11/18/2005. c.) NSTEMI 09 -> LHC 90% RI -> 2.5x12 mm Microdriver BMS. d.) LHC 35/57/32: 70% pLAD, 80-90% mLAD -> 2.25x12 mm (dLAD) and 2.75x20 mm (pLAD) Promus DES. e.) LHC 12/31/15: 90% pRCA, CTO mRCA ->3.5x16 mm pRCA and 2.5x32 mm Promus  DES. f.) LHC 08/31/18: 90% RPLB ->2.5x30 mm Res Onxy DES   Dyspnea    GERD (gastroesophageal reflux disease)    History of atrial flutter 11/19/2005   a.)  single episode following extubation from CABG --> DCCV x single 200J shock restored NSR.   History of heart artery stent 2009   6 TOTAL --> a.) 2.5 x 12 Microdriver BMS to RI (2025), b.) overlapping 2.25 x 12 mm (dLAD) and 2.75 x 20 mm (pLAD) Promus Premier DES (02/01/2014). c.) 3.5 x 16 mm (PRCA) and 2.5 x 32 mm(mRCA) Promus Premier (12/31/2015). d.) 2.5 x 30 mm Resolute Onxy to RPLB (08/31/2018)   Hyperlipidemia    Hypertension    Incomplete right bundle branch block (RBBB)    Ischemic cardiomyopathy    Long term current use of antithrombotics/antiplatelets    a.) DAPT therapy (ASA + clopidogrel)   NSTEMI (non-ST elevated myocardial infarction) ([place]) 11/14/2005   a.) LHC 11/15/2005: 100% RCA, 100% OM2, 75% RI, 75-95% LAD; CVTS consulted. b.) 3v CABG 11/19/2015: LIMA-LAD, SVG-OM2, SVG-RCA; severe pericarditis noted intraoperatively. LAD & OM intramyocardial --> required high doses of epi and milrinone to come off bypass. Extubated POD1, however developed resp distress and A.flutter --> DCCV x 1 and reintubated. Weaned from ventilator on POD4.   NSTEMI (non-ST elevated myocardial infarction) ([place]) 2009   a.) LHC -->  90% RI --> PCI performed placing a 2.5 x 12 mm Microdriver BMS.   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   a.) LIMA-LAD, SVG-OM2, SVG-RCA   Sepsis ([place])    Sleep apnea    a.) no nocturnal PAP therapy   T2DM (type 2 diabetes mellitus) ([place])    Past Surgical History:  Procedure Laterality Date   CARDIAC CATHETERIZATION Left 11/15/2005   Procedure: CARDIAC CATHETERIZATION; Location: [facility]; Surgeon: Janese Banks, MD   COLONOSCOPY  11/30/2013   COLONOSCOPY  05/05/2017   CORONARY ANGIOPLASTY WITH STENT PLACEMENT Left 2009   Procedure: CORONARY ANGIOPLASTY WITH STENT PLACEMENT (2.5 x 12 mm Microdriver BMS to RI)   CORONARY ANGIOPLASTY WITH STENT PLACEMENT Left 02/01/2014   Procedure: CORONARY ANGIOPLASTY WITH STENT PLACEMENT (overlapping 2.25 x 12 mm dLAD and 2.75 x 20 mm pLAD Promus Premier DES): Location:  [facility]; Surgeon: Trula Slade, MD   CORONARY ANGIOPLASTY WITH STENT PLACEMENT Left 12/31/2015   Procedure: STAGED CORONARY ANGIOPLASTY WITH STENT PLACEMENT (3.5 x 16 mm pRCA and 2.5 x 32 mm mRCA Promus Premier DES); Location: [facility]; Surgeon: Trula Slade, MD   CORONARY ANGIOPLASTY WITH STENT PLACEMENT  08/31/2018   Procedure: CORONARY ANGIOPLASTY WITH STENT PLACEMENT (2.5 x 30 mm Resolute Onyx DES to RPLB); [facility]: [facility]; Surgeon: Jacques Earthly, MD   CORONARY ARTERY BYPASS GRAFT  11/18/2004   Procedure: 3v CORONARY ARTERY BYPASS GRAFT; Location: [facility]; Surgeon: Gordy Clement, MD)   ERCP  10/06/2021   HERNIA REPAIR Bilateral    LEFT HEART CATH AND CORS/GRAFTS ANGIOGRAPHY Left 12/23/2015   Procedure: LEFT HEART CATH AND CORS/GRAFTS ANGIOGRAPHY; Location: [facility]; Surgeon: Trula Slade, MD   No family history on file. Social History   Tobacco Use   Smoking status: Former    Packs/day: 2.00    Years: 35.00    Pack years: 70.00    Types: Cigarettes    Quit date: 2002    Years since quitting: 21.1    Passive exposure: Never   Smokeless tobacco: Never  Vaping Use   Vaping Use: Never used  Substance Use Topics   Alcohol use: Not Currently   Drug use: Never    Pertinent Clinical Results:  LABS: Labs reviewed: Acceptable for surgery.  Lab Results  Component Value Date   WBC 9.6 09/01/2021   HGB 13.7 09/01/2021   HCT 40.5 09/01/2021   MCV 88.6 09/01/2021   PLT 175 09/01/2021   Lab Results  Component Value Date   NA 141 09/01/2021   K 3.6 09/01/2021   CO2 27 09/01/2021   GLUCOSE 134 (H) 09/01/2021   BUN 19 09/01/2021   CREATININE 1.06 09/01/2021   CALCIUM 8.9 09/01/2021   GFRNONAA >60 09/01/2021     ECG: Date: 08/28/2021 Time ECG obtained: 0051 AM Rate: 87 bpm Rhythm:  NSR; IRBBB  Axis (leads I and aVF): Normal Intervals: PR 174 ms. QRS 114 ms. QTc 454 ms. ST segment and T wave changes: No evidence of acute ST segment elevation or depression.  Evidence of an age undetermined inferior  and anterolateral infarcts present Comparison: Similar to previous tracing obtained on 04/20/2021   IMAGING / PROCEDURES: ENDOSCOPIC RETROGRADE CHOLANGIOPANCREATOGRAPHY performed on 10/06/2021 The major papilla appeared normal Choledocholithiasis was found Complete removal was accomplished by biliary sphincterotomy and balloon extraction Numerous gallstones and cystic duct stones No specimens collected  MRI WITH AND WITHOUT CONTRAST performed on 08/31/2021 Numerous gallstones in the gallbladder.   Suspect at least 1 tiny calculus or gravel in the central common bile duct measuring  no greater than 0.3 cm.   No biliary ductal dilatation; common bile duct measuring up to 0.6 cm No gallbladder wall thickening or pericholecystic fluid Cardiomegaly  TRANSTHORACIC ECHOCARDIOGRAM performed on 08/31/2021 Left ventricular ejection fraction, by estimation, is 45 to 50%. The left ventricle has mildly decreased function. The left ventricle demonstrates regional wall motion abnormalities; mild inferior hypokinesis. The left ventricular internal cavity size was mildly dilated. Left ventricular diastolic parameters are consistent with Grade diastolic dysfunction (impaired relaxation).  Right ventricular systolic function is normal. The right ventricular size is normal.  The mitral valve is normal in structure. Mild to moderate mitral valve regurgitation.  The aortic valve is grossly normal. Aortic valve regurgitation is not visualized.   LEFT HEART CATHETERIZATION AND CORONARY ANGIOGRAPHY performed on 08/31/2018 The LIMA to LAD is small and atretic.  SVG to OM and SVG to RCA are known to be occluded.  Coronary artery disease including occluded left circumflex, 50% mid-LAD stenosis, 60% D2 stenosis and long lesion up to 90% stenosis in the PL branch of the RCA.  Patent stents in the proximal LAD, proximal RCA and mid-RCA  50% stenosis in the left subclavian artery after origin of LIMA and vertebral  artery  Successful PCI of PL branch of RCA with placement of a 2.5 x 30 mm resolute Onyx DES resulting in 0% residual stenosis and TIMI-3 flow  MYOCARDIAL PERFUSION IMAGING STUDY (LEXISCAN) performed on 11/30/2017 LVEF 29% Global systolic function mildly reduced Multiple sinus rhythm with poor R wave progression and inferior Q waves; occasional PVCs.  No significant ST depression. Large in size, severe, fixed defect involving basal inferior, mid inferior, apical inferior, apical lateral, mid inferolateral, and basal inferolateral segments consistent with infarction involving the RCA and LCx Abnormal high risk study to  Impression and Plan:  Clifford Martin has been referred for pre-anesthesia review and clearance prior to him undergoing the planned anesthetic and procedural courses. Available labs, pertinent testing, and imaging results were personally reviewed by me. This patient has been appropriately cleared by cardiology with an overall MODERATE risk of significant perioperative cardiovascular complications.  Based on clinical review performed today (10/26/21), barring any significant acute changes in the patient's overall condition, it is anticipated that he will be able to proceed with the planned surgical intervention. Any acute changes in clinical condition may necessitate his procedure being postponed and/or cancelled. Patient will meet with anesthesia team (MD and/or CRNA) on the day of his procedure for preoperative evaluation/assessment. Questions regarding anesthetic course will be fielded at that time.   Pre-surgical instructions were reviewed with the patient during his PAT appointment and questions were fielded by PAT clinical staff. Patient was advised that if any questions or concerns arise prior to his procedure then he should return a call to PAT and/or his surgeon's office to discuss.  Honor Loh, MSN, APRN, FNP-C, CEN [facility]  Peri-operative Services  Nurse Practitioner Phone: [phone] Fax: [phone] 10/26/21 10:52 AM  NOTE: This note has been prepared using Dragon dictation software. Despite my best ability to proofread, there is always the potential that unintentional transcriptional errors may still occur from this process.

## 2021-10-30 ENCOUNTER — Encounter: Payer: Self-pay | Admitting: Surgery

## 2021-10-30 ENCOUNTER — Ambulatory Visit
Admission: RE | Admit: 2021-10-30 | Discharge: 2021-10-30 | Disposition: A | Discharge status: Home / Self Care | Payer: PPO | Attending: Surgery | Admitting: Surgery

## 2021-10-30 ENCOUNTER — Ambulatory Visit: Payer: PPO | Admitting: Urgent Care

## 2021-10-30 ENCOUNTER — Other Ambulatory Visit: Payer: Self-pay

## 2021-10-30 ENCOUNTER — Encounter
Admission: RE | Disposition: A | Discharge status: Home / Self Care | Payer: Self-pay | Source: Home / Self Care | Attending: Surgery

## 2021-10-30 DIAGNOSIS — Z951 Presence of aortocoronary bypass graft: Secondary | ICD-10-CM | POA: Diagnosis not present

## 2021-10-30 DIAGNOSIS — I2510 Atherosclerotic heart disease of native coronary artery without angina pectoris: Secondary | ICD-10-CM | POA: Diagnosis not present

## 2021-10-30 DIAGNOSIS — K8050 Calculus of bile duct without cholangitis or cholecystitis without obstruction: Secondary | ICD-10-CM

## 2021-10-30 DIAGNOSIS — K8060 Calculus of gallbladder and bile duct with cholecystitis, unspecified, without obstruction: Secondary | ICD-10-CM | POA: Diagnosis present

## 2021-10-30 DIAGNOSIS — I252 Old myocardial infarction: Secondary | ICD-10-CM | POA: Diagnosis not present

## 2021-10-30 DIAGNOSIS — Z955 Presence of coronary angioplasty implant and graft: Secondary | ICD-10-CM | POA: Insufficient documentation

## 2021-10-30 DIAGNOSIS — E785 Hyperlipidemia, unspecified: Secondary | ICD-10-CM | POA: Insufficient documentation

## 2021-10-30 DIAGNOSIS — I110 Hypertensive heart disease with heart failure: Secondary | ICD-10-CM | POA: Insufficient documentation

## 2021-10-30 DIAGNOSIS — K8044 Calculus of bile duct with chronic cholecystitis without obstruction: Secondary | ICD-10-CM | POA: Diagnosis not present

## 2021-10-30 DIAGNOSIS — I255 Ischemic cardiomyopathy: Secondary | ICD-10-CM | POA: Insufficient documentation

## 2021-10-30 DIAGNOSIS — E119 Type 2 diabetes mellitus without complications: Secondary | ICD-10-CM | POA: Diagnosis not present

## 2021-10-30 DIAGNOSIS — K219 Gastro-esophageal reflux disease without esophagitis: Secondary | ICD-10-CM | POA: Diagnosis not present

## 2021-10-30 DIAGNOSIS — I5020 Unspecified systolic (congestive) heart failure: Secondary | ICD-10-CM | POA: Diagnosis not present

## 2021-10-30 DIAGNOSIS — Z7902 Long term (current) use of antithrombotics/antiplatelets: Secondary | ICD-10-CM | POA: Insufficient documentation

## 2021-10-30 DIAGNOSIS — K429 Umbilical hernia without obstruction or gangrene: Secondary | ICD-10-CM | POA: Diagnosis not present

## 2021-10-30 HISTORY — DX: Unspecified right bundle-branch block: I45.10

## 2021-10-30 HISTORY — DX: Chronic obstructive pulmonary disease, unspecified: J44.9

## 2021-10-30 HISTORY — DX: Long term (current) use of antithrombotics/antiplatelets: Z79.02

## 2021-10-30 HISTORY — DX: Type 2 diabetes mellitus without complications: E11.9

## 2021-10-30 HISTORY — PX: UMBILICAL HERNIA REPAIR: SHX196

## 2021-10-30 LAB — GLUCOSE, CAPILLARY
Glucose-Capillary: 161 mg/dL — ABNORMAL HIGH (ref 70–99)
Glucose-Capillary: 164 mg/dL — ABNORMAL HIGH (ref 70–99)

## 2021-10-30 SURGERY — CHOLECYSTECTOMY, ROBOT-ASSISTED, LAPAROSCOPIC
Anesthesia: General | Site: Abdomen

## 2021-10-30 MED ORDER — GABAPENTIN 300 MG PO CAPS: 300.0000 mg | ORAL_CAPSULE | ORAL | Status: AC

## 2021-10-30 MED ORDER — LIDOCAINE HCL (PF) 2 % IJ SOLN
INTRAMUSCULAR | Status: AC
  Filled 2021-10-30: qty 5

## 2021-10-30 MED ORDER — ONDANSETRON HCL 4 MG/2ML IJ SOLN: 4.0000 mg | Freq: Once | INTRAMUSCULAR | Status: DC | PRN

## 2021-10-30 MED ORDER — BUPIVACAINE-EPINEPHRINE (PF) 0.25% -1:200000 IJ SOLN
INTRAMUSCULAR | Status: AC
  Filled 2021-10-30: qty 30

## 2021-10-30 MED ORDER — ROCURONIUM BROMIDE 10 MG/ML (PF) SYRINGE
PREFILLED_SYRINGE | INTRAVENOUS | Status: AC
  Filled 2021-10-30: qty 10

## 2021-10-30 MED ORDER — FENTANYL CITRATE (PF) 100 MCG/2ML IJ SOLN
INTRAMUSCULAR | Status: DC | PRN
Start: 2021-10-30 — End: 2021-10-30
  Administered 2021-10-30 (×2): 50 ug via INTRAVENOUS

## 2021-10-30 MED ORDER — DEXAMETHASONE SODIUM PHOSPHATE 10 MG/ML IJ SOLN
INTRAMUSCULAR | Status: AC
  Filled 2021-10-30: qty 1

## 2021-10-30 MED ORDER — ONDANSETRON HCL 4 MG/2ML IJ SOLN
INTRAMUSCULAR | Status: AC
  Filled 2021-10-30: qty 2

## 2021-10-30 MED ORDER — FENTANYL CITRATE (PF) 100 MCG/2ML IJ SOLN
25.0000 ug | INTRAMUSCULAR | Status: DC | PRN
  Administered 2021-10-30 (×2): 25 ug via INTRAVENOUS

## 2021-10-30 MED ORDER — CHLORHEXIDINE GLUCONATE CLOTH 2 % EX PADS
6.0000 | MEDICATED_PAD | Freq: Once | CUTANEOUS | Status: AC
  Administered 2021-10-30: 6 via TOPICAL

## 2021-10-30 MED ORDER — FENTANYL CITRATE (PF) 100 MCG/2ML IJ SOLN
INTRAMUSCULAR | Status: AC
  Filled 2021-10-30: qty 2

## 2021-10-30 MED ORDER — ACETAMINOPHEN 500 MG PO TABS: 1000.0000 mg | ORAL_TABLET | ORAL | Status: AC

## 2021-10-30 MED ORDER — CHLORHEXIDINE GLUCONATE 0.12 % MT SOLN: 15.0000 mL | Freq: Once | OROMUCOSAL | Status: AC

## 2021-10-30 MED ORDER — LIDOCAINE HCL (CARDIAC) PF 100 MG/5ML IV SOSY
PREFILLED_SYRINGE | INTRAVENOUS | Status: DC | PRN
Start: 2021-10-30 — End: 2021-10-30
  Administered 2021-10-30: 100 mg via INTRAVENOUS

## 2021-10-30 MED ORDER — ACETAMINOPHEN 10 MG/ML IV SOLN: 1000.0000 mg | Freq: Once | INTRAVENOUS | Status: DC | PRN

## 2021-10-30 MED ORDER — CHLORHEXIDINE GLUCONATE CLOTH 2 % EX PADS: 6.0000 | MEDICATED_PAD | Freq: Once | CUTANEOUS | Status: DC

## 2021-10-30 MED ORDER — LACTATED RINGERS IV SOLN: INTRAVENOUS | Status: DC

## 2021-10-30 MED ORDER — CEFAZOLIN SODIUM-DEXTROSE 2-4 GM/100ML-% IV SOLN
INTRAVENOUS | Status: AC
  Filled 2021-10-30: qty 100

## 2021-10-30 MED ORDER — CHLORHEXIDINE GLUCONATE 0.12 % MT SOLN
OROMUCOSAL | Status: AC
  Administered 2021-10-30: 15 mL via OROMUCOSAL
  Filled 2021-10-30: qty 15

## 2021-10-30 MED ORDER — ROCURONIUM BROMIDE 100 MG/10ML IV SOLN
INTRAVENOUS | Status: DC | PRN
  Administered 2021-10-30: 50 mg via INTRAVENOUS
  Administered 2021-10-30: 10 mg via INTRAVENOUS

## 2021-10-30 MED ORDER — FENTANYL CITRATE (PF) 100 MCG/2ML IJ SOLN
INTRAMUSCULAR | Status: AC
Start: 2021-10-30 — End: ?
  Filled 2021-10-30: qty 2

## 2021-10-30 MED ORDER — ORAL CARE MOUTH RINSE: 15.0000 mL | Freq: Once | OROMUCOSAL | Status: AC

## 2021-10-30 MED ORDER — OXYCODONE HCL 5 MG/5ML PO SOLN: 5.0000 mg | Freq: Once | ORAL | Status: AC | PRN

## 2021-10-30 MED ORDER — BUPIVACAINE LIPOSOME 1.3 % IJ SUSP
INTRAMUSCULAR | Status: AC
  Filled 2021-10-30: qty 20

## 2021-10-30 MED ORDER — SODIUM CHLORIDE 0.9 % IV SOLN: INTRAVENOUS | Status: DC

## 2021-10-30 MED ORDER — GABAPENTIN 300 MG PO CAPS
ORAL_CAPSULE | ORAL | Status: AC
  Filled 2021-10-30: qty 1

## 2021-10-30 MED ORDER — PROPOFOL 10 MG/ML IV BOLUS
INTRAVENOUS | Status: AC
  Filled 2021-10-30: qty 20

## 2021-10-30 MED ORDER — CHLORHEXIDINE GLUCONATE 0.12 % MT SOLN
OROMUCOSAL | Status: AC
  Filled 2021-10-30: qty 15

## 2021-10-30 MED ORDER — CEFAZOLIN SODIUM-DEXTROSE 2-4 GM/100ML-% IV SOLN
2.0000 g | INTRAVENOUS | Status: AC
  Administered 2021-10-30: 2 g via INTRAVENOUS

## 2021-10-30 MED ORDER — ONDANSETRON HCL 4 MG/2ML IJ SOLN
INTRAMUSCULAR | Status: DC | PRN
  Administered 2021-10-30: 4 mg via INTRAVENOUS

## 2021-10-30 MED ORDER — ESMOLOL HCL 100 MG/10ML IV SOLN
INTRAVENOUS | Status: DC | PRN
  Administered 2021-10-30 (×2): 10 mg via INTRAVENOUS

## 2021-10-30 MED ORDER — PHENYLEPHRINE 40 MCG/ML (10ML) SYRINGE FOR IV PUSH (FOR BLOOD PRESSURE SUPPORT)
PREFILLED_SYRINGE | INTRAVENOUS | Status: DC | PRN
  Administered 2021-10-30: 160 ug via INTRAVENOUS
  Administered 2021-10-30 (×2): 80 ug via INTRAVENOUS

## 2021-10-30 MED ORDER — OXYCODONE HCL 5 MG PO TABS: 5.0000 mg | ORAL_TABLET | Freq: Once | ORAL | Status: AC | PRN

## 2021-10-30 MED ORDER — BUPIVACAINE-EPINEPHRINE (PF) 0.5% -1:200000 IJ SOLN
INTRAMUSCULAR | Status: DC | PRN
  Administered 2021-10-30: 50 mL via SURGICAL_CAVITY

## 2021-10-30 MED ORDER — SUGAMMADEX SODIUM 200 MG/2ML IV SOLN
INTRAVENOUS | Status: DC | PRN
  Administered 2021-10-30: 200 mg via INTRAVENOUS

## 2021-10-30 MED ORDER — ACETAMINOPHEN 500 MG PO TABS
ORAL_TABLET | ORAL | Status: AC
  Administered 2021-10-30: 1000 mg via ORAL
  Filled 2021-10-30: qty 2

## 2021-10-30 MED ORDER — INDOCYANINE GREEN 25 MG IV SOLR
2.5000 mg | Freq: Once | INTRAVENOUS | Status: AC
  Administered 2021-10-30: 2.5 mg via INTRAVENOUS
  Filled 2021-10-30: qty 1

## 2021-10-30 MED ORDER — FENTANYL CITRATE (PF) 100 MCG/2ML IJ SOLN
INTRAMUSCULAR | Status: AC
  Administered 2021-10-30: 25 ug via INTRAVENOUS
  Filled 2021-10-30: qty 2

## 2021-10-30 MED ORDER — OXYCODONE-ACETAMINOPHEN 5-325 MG PO TABS: 1.0000 | ORAL_TABLET | ORAL | 0 refills | Status: DC | PRN

## 2021-10-30 MED ORDER — PHENYLEPHRINE HCL (PRESSORS) 10 MG/ML IV SOLN
INTRAVENOUS | Status: AC
  Filled 2021-10-30: qty 1

## 2021-10-30 MED ORDER — PROPOFOL 10 MG/ML IV BOLUS
INTRAVENOUS | Status: DC | PRN
Start: 2021-10-30 — End: 2021-10-30
  Administered 2021-10-30: 120 mg via INTRAVENOUS

## 2021-10-30 MED ORDER — ESMOLOL HCL 100 MG/10ML IV SOLN
INTRAVENOUS | Status: AC
  Filled 2021-10-30: qty 10

## 2021-10-30 MED ORDER — ACETAMINOPHEN 500 MG PO TABS
ORAL_TABLET | ORAL | Status: AC
  Filled 2021-10-30: qty 2

## 2021-10-30 MED ORDER — DEXAMETHASONE SODIUM PHOSPHATE 10 MG/ML IJ SOLN
INTRAMUSCULAR | Status: DC | PRN
  Administered 2021-10-30: 5 mg via INTRAVENOUS

## 2021-10-30 MED ORDER — GABAPENTIN 300 MG PO CAPS
ORAL_CAPSULE | ORAL | Status: AC
  Administered 2021-10-30: 300 mg via ORAL
  Filled 2021-10-30: qty 1

## 2021-10-30 MED ORDER — PHENYLEPHRINE 40 MCG/ML (10ML) SYRINGE FOR IV PUSH (FOR BLOOD PRESSURE SUPPORT)
PREFILLED_SYRINGE | INTRAVENOUS | Status: AC
  Filled 2021-10-30: qty 10

## 2021-10-30 MED ORDER — OXYCODONE HCL 5 MG PO TABS
ORAL_TABLET | ORAL | Status: AC
  Administered 2021-10-30: 5 mg via ORAL
  Filled 2021-10-30: qty 1

## 2021-10-30 SURGICAL SUPPLY — 54 items
ADH SKN CLS APL DERMABOND .7 (GAUZE/BANDAGES/DRESSINGS) ×1
BAG SPEC RTRVL LRG 6X4 10 (ENDOMECHANICALS) ×1
CANNULA REDUC XI 12-8 STAPL (CANNULA) ×1
CANNULA REDUCER 12-8 DVNC XI (CANNULA) ×1 IMPLANT
CATH REDDICK CHOLANGI 4FR 50CM (CATHETERS) IMPLANT
CLIP LIGATING HEMO O LOK GREEN (MISCELLANEOUS) ×2 IMPLANT
DERMABOND ADVANCED (GAUZE/BANDAGES/DRESSINGS) ×1
DERMABOND ADVANCED .7 DNX12 (GAUZE/BANDAGES/DRESSINGS) ×1 IMPLANT
DRAPE ARM DVNC X/XI (DISPOSABLE) ×4 IMPLANT
DRAPE COLUMN DVNC XI (DISPOSABLE) ×1 IMPLANT
DRAPE DA VINCI XI ARM (DISPOSABLE) ×4
DRAPE DA VINCI XI COLUMN (DISPOSABLE) ×1
ELECT CAUTERY BLADE 6.4 (BLADE) ×2 IMPLANT
ELECT REM PT RETURN 9FT ADLT (ELECTROSURGICAL) ×2
ELECTRODE REM PT RTRN 9FT ADLT (ELECTROSURGICAL) ×1 IMPLANT
GLOVE SURG ENC MOIS LTX SZ7 (GLOVE) ×4 IMPLANT
GOWN STRL REUS W/ TWL LRG LVL3 (GOWN DISPOSABLE) ×4 IMPLANT
GOWN STRL REUS W/TWL LRG LVL3 (GOWN DISPOSABLE) ×8
IRRIGATION STRYKERFLOW (MISCELLANEOUS) IMPLANT
IRRIGATOR STRYKERFLOW (MISCELLANEOUS)
IV CATH ANGIO 12GX3 LT BLUE (NEEDLE) IMPLANT
IV NS 1000ML (IV SOLUTION)
IV NS 1000ML BAXH (IV SOLUTION) IMPLANT
KIT PINK PAD W/HEAD ARE REST (MISCELLANEOUS) ×2
KIT PINK PAD W/HEAD ARM REST (MISCELLANEOUS) ×1 IMPLANT
LABEL OR SOLS (LABEL) ×2 IMPLANT
MANIFOLD NEPTUNE II (INSTRUMENTS) ×2 IMPLANT
NEEDLE HYPO 22GX1.5 SAFETY (NEEDLE) ×2 IMPLANT
NS IRRIG 500ML POUR BTL (IV SOLUTION) ×2 IMPLANT
OBTURATOR OPTICAL STANDARD 8MM (TROCAR) ×1
OBTURATOR OPTICAL STND 8 DVNC (TROCAR) ×1
OBTURATOR OPTICALSTD 8 DVNC (TROCAR) ×1 IMPLANT
PACK LAP CHOLECYSTECTOMY (MISCELLANEOUS) ×2 IMPLANT
PENCIL ELECTRO HAND CTR (MISCELLANEOUS) ×2 IMPLANT
POUCH SPECIMEN RETRIEVAL 10MM (ENDOMECHANICALS) ×2 IMPLANT
SEAL CANN UNIV 5-8 DVNC XI (MISCELLANEOUS) ×3 IMPLANT
SEAL XI 5MM-8MM UNIVERSAL (MISCELLANEOUS) ×3
SET TUBE SMOKE EVAC HIGH FLOW (TUBING) ×2 IMPLANT
SOLUTION ELECTROLUBE (MISCELLANEOUS) ×2 IMPLANT
SPIKE FLUID TRANSFER (MISCELLANEOUS) ×2 IMPLANT
SPONGE T-LAP 18X18 ~~LOC~~+RFID (SPONGE) ×2 IMPLANT
SPONGE T-LAP 4X18 ~~LOC~~+RFID (SPONGE) IMPLANT
STAPLER CANNULA SEAL DVNC XI (STAPLE) ×1 IMPLANT
STAPLER CANNULA SEAL XI (STAPLE) ×1
STOPCOCK 3 WAY MALE LL (IV SETS)
STOPCOCK 3WAY MALE LL (IV SETS) IMPLANT
SUT ETHIBOND 0 MO6 C/R (SUTURE) ×2 IMPLANT
SUT MNCRL AB 4-0 PS2 18 (SUTURE) ×2 IMPLANT
SUT VICRYL 0 AB UR-6 (SUTURE) ×4 IMPLANT
SYR 20ML LL LF (SYRINGE) ×2 IMPLANT
SYR 30ML LL (SYRINGE) ×2 IMPLANT
TAPE TRANSPORE STRL 2 31045 (GAUZE/BANDAGES/DRESSINGS) ×2 IMPLANT
TROCAR BALLN GELPORT 12X130M (ENDOMECHANICALS) ×2 IMPLANT
WATER STERILE IRR 500ML POUR (IV SOLUTION) ×2 IMPLANT

## 2021-10-30 NOTE — Transfer of Care (Signed)
Immediate Anesthesia Transfer of Care Note  Patient: Clifford Martin  Procedure(s) Performed: XI ROBOTIC ASSISTED LAPAROSCOPIC CHOLECYSTECTOMY (Abdomen) INDOCYANINE GREEN FLUORESCENCE IMAGING (ICG) (Abdomen) HERNIA REPAIR UMBILICAL ADULT (Abdomen)  Patient Location: PACU  Anesthesia Type:General  Level of Consciousness: awake  Airway & Oxygen Therapy: Patient Spontanous Breathing  Post-op Assessment: Report given to RN and Post -op Vital signs reviewed and stable  Post vital signs: Reviewed and stable  Last Vitals:  Vitals Value Taken Time  BP 111/74 10/30/21 1000  Temp 36.6 C 10/30/21 0957  Pulse 92 10/30/21 1006  Resp 14 10/30/21 1006  SpO2 96 % 10/30/21 1006  Vitals shown include unvalidated device data.  Last Pain:  Vitals:   10/30/21 0957  TempSrc:   PainSc: 0-No pain         Complications: No notable events documented.

## 2021-10-30 NOTE — Anesthesia Procedure Notes (Signed)
Procedure Name: Intubation Date/Time: 10/30/2021 8:43 AM Performed by: Cammie Sickle, CRNA Pre-anesthesia Checklist: Patient identified, Emergency Drugs available, Suction available and Patient being monitored Patient Re-evaluated:Patient Re-evaluated prior to induction Oxygen Delivery Method: Circle system utilized Preoxygenation: Pre-oxygenation with 100% oxygen Induction Type: IV induction Ventilation: Mask ventilation without difficulty Laryngoscope Size: McGraph and 3 Grade View: Grade I Tube type: Oral Tube size: 7.5 mm Number of attempts: 1 Airway Equipment and Method: Stylet Placement Confirmation: ETT inserted through vocal cords under direct vision, positive ETCO2 and breath sounds checked- equal and bilateral Secured at: 21 cm Tube secured with: Tape Dental Injury: Teeth and Oropharynx as per pre-operative assessment

## 2021-10-30 NOTE — Op Note (Addendum)
Robotic assisted laparoscopic Cholecystectomy  Pre-operative Diagnosis: Hx choledocholithiasis and biliary colic  Post-operative Diagnosis: same  Procedure:  Robotic assisted laparoscopic Cholecystectomy Repair of Umbilical hernia 3.5 cms required additional time and effort outside a routine umbilical henria  Surgeon: Caroleen Hamman, MD FACS  Anesthesia: Gen. with endotracheal tube  Findings: Chronic mild Cholecystitis , large gallstone requiring lengthening of the umbilical fascia to remove stone. Umbilical hernia  Estimated Blood Loss:5 cc       Specimens: Gallbladder hernia sac        Complications: none   Procedure Details  The patient was seen again in the Holding Room. The benefits, complications, treatment options, and expected outcomes were discussed with the patient. The risks of bleeding, infection, recurrence of symptoms, failure to resolve symptoms, bile duct damage, bile duct leak, retained common bile duct stone, bowel injury, any of which could require further surgery and/or ERCP, stent, or papillotomy were reviewed with the patient. The likelihood of improving the patient's symptoms with return to their baseline status is good.  The patient and/or family concurred with the proposed plan, giving informed consent.  The patient was taken to Operating Room, identified  and the procedure verified as Laparoscopic Cholecystectomy. A Time Out was held and the above information confirmed.   Prior to the induction of general anesthesia, antibiotic prophylaxis was administered. VTE prophylaxis was in place. General endotracheal anesthesia was then administered and tolerated well. After the induction, the abdomen was prepped with Chloraprep and draped in the sterile fashion. The patient was positioned in the supine position. Attention was turned to the umbilicus when there was in fact an umbilical hernia.  Infraumbilical incision was created in a transverse fashion.  The hernia sac  was dissected and excised.  The edges of the fascia were cleaned.  Initially I placed stay sutures on each side of the hernia.  The hernia defect measured initially 15 mm. A Hasson trochar was placed. Pneumoperitoneum was then created with CO2 and tolerated well without any adverse changes in the patient's vital signs.  Three 8-mm ports were placed under direct vision. All skin incisions  were infiltrated with a local anesthetic agent before making the incision and placing the trocars.   The patient was positioned  in reverse Trendelenburg, robot was brought to the surgical field and docked in the standard fashion.  We made sure all the instrumentation was kept indirect view at all times and that there were no collision between the arms. I scrubbed out and went to the console.  The gallbladder was identified, the fundus grasped and retracted cephalad. Adhesions were lysed bluntly. The infundibulum was grasped and retracted laterally, exposing the peritoneum overlying the triangle of Calot. This was then divided and exposed in a blunt fashion. An extended critical view of the cystic duct and cystic artery was obtained.  The cystic duct was clearly identified and bluntly dissected.   Artery and duct were double clipped and divided. Using ICG cholangiography we visualize the cystic duct and so CBD no evidence of bile injuries observed. The gallbladder was taken from the gallbladder fossa in a retrograde fashion with the electrocautery.  Hemostasis was achieved with the electrocautery. nspection of the right upper quadrant was performed. No bleeding, bile duct injury or leak, or bowel injury was noted. The gallbladder was  placed in an Endocatch bag.  Robotic instruments and robotic arms were undocked in the standard fashion.  I scrubbed back in. Opening trying to remove the specimen it was obvious  that this was a giant gallstone.  I then had to remove my Hossein trocar and lengthening the fascial incision to  3.5 cm.  This allowed appropriate specimen removal.   Pneumoperitoneum was released.  The umbilical fascia and defect were closed with multiple interrupted 0 Ethibond sutures.  We perform a true primary hernia repair in the standard fashion.  Please note that the final length of the hernia defect was 3.5 cm . 4-0 subcuticular Monocryl was used to close the skin. Dermabond was  applied.  The patient was then extubated and brought to the recovery room in stable condition. Sponge, lap, and needle counts were correct at closure and at the conclusion of the case.               Caroleen Hamman, MD, FACS

## 2021-10-30 NOTE — Interval H&P Note (Signed)
History and Physical Interval Note:  10/30/2021 8:26 AM  Clifford Martin  has presented today for surgery, with the diagnosis of choledocholithiasis.  The various methods of treatment have been discussed with the patient and family. After consideration of risks, benefits and other options for treatment, the patient has consented to  Procedure(s): XI ROBOTIC ASSISTED LAPAROSCOPIC CHOLECYSTECTOMY (N/A) [place] (ICG) (N/A) as a surgical intervention.  The patient's history has been reviewed, patient examined, no change in status, stable for surgery.  I have reviewed the patient's chart and labs.  Questions were answered to the patient's satisfaction.   I discussed the procedure in detail.  The patient was given Neurosurgeon.  We discussed the risks and benefits of a laparoscopic cholecystectomy and possible cholangiogram including, but not limited to bleeding, infection, injury to surrounding structures such as the intestine or liver, bile leak, retained gallstones, need to convert to an open procedure, prolonged diarrhea, blood clots such as  DVT, common bile duct injury, anesthesia risks, and possible need for additional procedures.  The likelihood of improvement in symptoms and return to the patient's normal status is good. We discussed the typical post-operative recovery course.   [place]

## 2021-10-30 NOTE — Discharge Instructions (Addendum)
Laparoscopic Cholecystectomy, Care After   These instructions give you information on caring for yourself after your procedure. Your doctor may also give you more specific instructions. Call your doctor if you have any problems or questions after your procedure.  HOME CARE  Change your bandages (dressings) as told by your doctor.  Keep the wound dry and clean. Wash the wound gently with soap and water. Pat the wound dry with a clean towel.  Do not take baths, swim, or use hot tubs for 2 weeks, or as told by your doctor.  Only take medicine as told by your doctor.  Eat a normal diet as told by your doctor.  Do not lift anything heavier than 10 pounds (4.5 kg) until your doctor says it is okay.  Do not play contact sports for 1 week, or as told by your doctor. GET HELP IF:  Your wound is red, puffy (swollen), or painful.  You have yellowish-white fluid (pus) coming from the wound.  You have fluid draining from the wound for more than 1 day.  You have a bad smell coming from the wound.  Your wound breaks open. GET HELP RIGHT AWAY IF:  You have trouble breathing.  You have chest pain.  You have a fever >101  You have pain in the shoulders (shoulder strap areas) that is getting worse.  You feel dizzy or pass out (faint).  You have severe belly (abdominal) pain.  You feel sick to your stomach (nauseous) or throw up (vomit) for more than 1 day.  AMBULATORY SURGERY  DISCHARGE INSTRUCTIONS   The drugs that you were given will stay in your system until tomorrow so for the next 24 hours you should not:  Drive an automobile Make any legal decisions Drink any alcoholic beverage   You may resume regular meals tomorrow.  Today it is better to start with liquids and gradually work up to solid foods.  You may eat anything you prefer, but it is better to start with liquids, then soup and crackers, and gradually work up to solid foods.   Please notify your doctor immediately if you have any  unusual bleeding, trouble breathing, redness and pain at the surgery site, drainage, fever, or pain not relieved by medication.    Additional Instructions: PLEASE LEAVE GREEN ARMBAND FOR 72 HOURS      Please contact your physician with any problems or Same Day Surgery at [phone], Monday through Friday 6 am to 4 pm, or [place] at [facility] number at [phone].

## 2021-10-30 NOTE — Anesthesia Preprocedure Evaluation (Addendum)
Anesthesia Evaluation  Patient identified by MRN, date of birth, ID band Patient awake    Reviewed: Allergy & Precautions, NPO status , Patient's Chart, lab work & pertinent test results  History of Anesthesia Complications Negative for: history of anesthetic complications  Airway Mallampati: III   Neck ROM: Full    Dental  (+) Edentulous Upper, Edentulous Lower   Pulmonary COPD, former smoker (quit 2002),    Pulmonary exam normal breath sounds clear to auscultation       Cardiovascular hypertension, + CAD (s/p MI, CABG, stents on Plavix, last dose 10 days ago) and +CHF (EF 45%, 2/2 ICM)  Normal cardiovascular exam Rhythm:Regular Rate:Normal  ECG 08/28/21: NSR, incomplete RBBB; inferior infarct, age undetermined; possible anterolateral infarct, age undetermined  Echo 04/21/21:  1. The left ventricle is mildly dilated in size with normal wall thickness.  2. The left ventricular systolic function is mildly to moderately decreased, LVEF is visually estimated at 40-45%.  3. There is thinning and akinesis involving the mid inferolateral and basal inferolateral segment(s).  4. There is grade II diastolic dysfunction (elevated filling pressure).  5. The mitral valve leaflets are mildly thickened with normal leaflet mobility.  6. There is mild to moderate mitral valve regurgitation.  7. The left atrium is moderately dilated in size.  8. The right ventricle is mildly dilated in size, with low normal systolic function.  9. There is mild pulmonary hypertension.  10. The right atrium is mildly dilated in size.   Neuro/Psych negative neurological ROS     GI/Hepatic GERD  ,  Endo/Other  diabetes, Type 2  Renal/GU negative Renal ROS     Musculoskeletal   Abdominal   Peds  Hematology negative hematology ROS (+)   Anesthesia Other Findings Reviewed and agree with Clifford Martin pre-anesthesia clinical review  note.  Cardiology note 09/23/21:  Assessment & Plan: Clifford Martin is a 81 y.o. 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   CAD: CAD s/p 3 vessel CABG (2006: LIMA to LAD, SVG to OM, and SVG to RCA), multiple PCIs and most recently to PL branch of RCA (08/2018 w/ Dr. Gennette Pac) and LHC significant for severe known native CAD w/ patent LAD, prox/mid RCA stents. He has remained on evidenced based medical therapy, has known LCx CTO. He went to the ED for significant back pain that resolved while he was in the ambulance. He denies having exertional angina. At his previous visit he was describing exertional angina but this was almost 2 years ago. Could consider intervention to LCx CTO if he has anginal symptoms recur.  - Continue Plavix 75 mg daily, aspirin 81  - Continue lisinopril 5 mg daily - Change metoprolol tartrate 50 mg by mouth twice daily - Consider LCx CTO intervention if he has symptoms but not necessary at this time  Hypertension: Blood pressure is controlled today. He has not been routinely checking at home, cont rx  Hyperlipidemia - Continue atorvastatin 40 daily, had significant leg cramps at higher doses. 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 No symptoms suggestive of volume overload. He has  shortness of breath at baseline which is stable. No change, pt rarely takes lasix for ankle edema Cont rx for now Reevaluate after necessary surgery  rtc 3 months   Reproductive/Obstetrics                           Anesthesia Physical Anesthesia Plan  ASA: 3  Anesthesia  Plan: General   Post-op Pain Management:    Induction: Intravenous  PONV Risk Score and Plan: 2 and Ondansetron, Dexamethasone and Treatment may vary due to age or medical condition  Airway Management Planned: Oral ETT  Additional Equipment:   Intra-op Plan:   Post-operative Plan: Extubation in OR  Informed Consent: I have reviewed the patients History and Physical, chart, labs and discussed the procedure including the risks, benefits and alternatives for the proposed anesthesia with the patient or authorized representative who has indicated his/her understanding and acceptance.     Dental advisory given  Plan Discussed with: CRNA  Anesthesia Plan Comments: (Patient consented for risks of anesthesia including but not limited to:  - adverse reactions to medications - damage to eyes, teeth, lips or other oral mucosa - nerve damage due to positioning  - sore throat or hoarseness - damage to heart, brain, nerves, lungs, other parts of body or loss of life  Informed patient about role of CRNA in peri- and intra-operative care.  Patient voiced understanding.)        Anesthesia Quick Evaluation

## 2021-10-30 NOTE — Anesthesia Postprocedure Evaluation (Signed)
Anesthesia Post Note  Patient: Clifford Martin  Procedure(s) Performed: XI ROBOTIC ASSISTED LAPAROSCOPIC CHOLECYSTECTOMY (Abdomen) INDOCYANINE GREEN FLUORESCENCE IMAGING (ICG) (Abdomen) HERNIA REPAIR UMBILICAL ADULT (Abdomen)  Patient location during evaluation: PACU Anesthesia Type: General Level of consciousness: awake and alert, oriented and patient cooperative Pain management: pain level controlled Vital Signs Assessment: post-procedure vital signs reviewed and stable Respiratory status: spontaneous breathing, nonlabored ventilation and respiratory function stable Cardiovascular status: blood pressure returned to baseline and stable Postop Assessment: adequate PO intake Anesthetic complications: no   No notable events documented.   Last Vitals:  Vitals:   10/30/21 1045 10/30/21 1057  BP: (!) 126/47 (!) 148/70  Pulse: 86 85  Resp: 20 18  Temp: 37 C 36.7 C  SpO2: 95% 99%    Last Pain:  Vitals:   10/30/21 1057  TempSrc: Temporal  PainSc: [place]

## 2021-10-31 ENCOUNTER — Encounter: Payer: Self-pay | Admitting: Surgery

## 2021-11-02 LAB — SURGICAL PATHOLOGY

## 2021-11-19 ENCOUNTER — Ambulatory Visit: Payer: PPO | Admitting: Gastroenterology

## 2021-11-23 ENCOUNTER — Encounter: Payer: PPO | Admitting: Surgery

## 2021-11-26 ENCOUNTER — Encounter: Payer: Self-pay | Admitting: Physician Assistant

## 2021-11-26 ENCOUNTER — Other Ambulatory Visit: Payer: Self-pay

## 2021-11-26 ENCOUNTER — Ambulatory Visit (INDEPENDENT_AMBULATORY_CARE_PROVIDER_SITE_OTHER): Payer: PPO | Admitting: Physician Assistant

## 2021-11-26 VITALS — BP 117/71 | HR 83 | Temp 97.9°F | Ht 70.0 in | Wt 179.8 lb

## 2021-11-26 DIAGNOSIS — K8050 Calculus of bile duct without cholangitis or cholecystitis without obstruction: Secondary | ICD-10-CM

## 2021-11-26 DIAGNOSIS — K8020 Calculus of gallbladder without cholecystitis without obstruction: Secondary | ICD-10-CM

## 2021-11-26 DIAGNOSIS — Z09 Encounter for follow-up examination after completed treatment for conditions other than malignant neoplasm: Secondary | ICD-10-CM

## 2021-11-26 NOTE — Patient Instructions (Signed)

## 2021-11-26 NOTE — Progress Notes (Signed)
[facility] ?POST-OP OFFICE VISIT ? ?11/26/2021 ? ?HPI: ?Clifford Martin is a 81 y.o. male ~1 month s/p robotic assisted laparoscopic cholecystectomy and umbilical hernia repair with Dr Dahlia Byes.  ? ?He is overall doing very well ?Minimal abdominal discomfort; not needing any pain medications for this ?No fever, chills, nausea, emesis ?Incisions are healing well. He does endorse a small amount of serous drainage from umbilicus; no erythema ?Tolerating PO ?No other complaints  ? ?Vital signs: ?BP 117/71   Pulse 83   Temp 97.9 ?F (36.6 ?C) (Oral)   Ht '5\' 10"'$  (1.778 m)   Wt 179 lb 12.8 oz (81.6 kg)   SpO2 92%   BMI 25.80 kg/m?   ? ?Physical Exam: ?Constitutional: Well appearing male, NAD ?Abdomen: Soft, non-tender, non-distended, no rebound/guarding ?Skin: Laparoscopic incisions are healing well, no erythema or drainage. He has a very faint amount of serous drainage at umbilicus, suspect he likely had small seroma in this area, no evidence of infection or undrained abscess  ? ?Assessment/Plan: ?This is a 81 y.o. male ~1 month s/p robotic assisted laparoscopic cholecystectomy and umbilical hernia repair ? ? - Pain control prn ? - Reviewed wound care recommendation ? - Reviewed lifting restrictions; 6 weeks total ? - Reviewed surgical pathology; [place], negative for malignancy ? - He can follow up on as needed basis; He understands to call with questions/concerns ? ?-- ?Edison Simon, PA-C ?[facility] ?11/26/2021, 2:02 PM ?[phone] ?M-F: 7am - 4pm ? ?

## 2022-08-03 ENCOUNTER — Ambulatory Visit (INDEPENDENT_AMBULATORY_CARE_PROVIDER_SITE_OTHER): Payer: PPO

## 2022-08-03 ENCOUNTER — Ambulatory Visit
Admission: EM | Admit: 2022-08-03 | Discharge: 2022-08-03 | Disposition: A | Discharge status: Home / Self Care | Payer: PPO | Attending: Physician Assistant | Admitting: Physician Assistant

## 2022-08-03 DIAGNOSIS — R059 Cough, unspecified: Secondary | ICD-10-CM | POA: Diagnosis not present

## 2022-08-03 DIAGNOSIS — R0989 Other specified symptoms and signs involving the circulatory and respiratory systems: Secondary | ICD-10-CM

## 2022-08-03 DIAGNOSIS — J441 Chronic obstructive pulmonary disease with (acute) exacerbation: Secondary | ICD-10-CM | POA: Insufficient documentation

## 2022-08-03 LAB — CBC WITH DIFFERENTIAL/PLATELET
Abs Immature Granulocytes: 0.04 10*3/uL (ref 0.00–0.07)
Basophils Absolute: 0 10*3/uL (ref 0.0–0.1)
Basophils Relative: 0 %
Eosinophils Absolute: 0.1 10*3/uL (ref 0.0–0.5)
Eosinophils Relative: 1 %
HCT: 38 % — ABNORMAL LOW (ref 39.0–52.0)
Hemoglobin: 12.8 g/dL — ABNORMAL LOW (ref 13.0–17.0)
Immature Granulocytes: 0 %
Lymphocytes Relative: 11 %
Lymphs Abs: 1.3 10*3/uL (ref 0.7–4.0)
MCH: 30.5 pg (ref 26.0–34.0)
MCHC: 33.7 g/dL (ref 30.0–36.0)
MCV: 90.7 fL (ref 80.0–100.0)
Monocytes Absolute: 0.9 10*3/uL (ref 0.1–1.0)
Monocytes Relative: 8 %
Neutro Abs: 8.9 10*3/uL — ABNORMAL HIGH (ref 1.7–7.7)
Neutrophils Relative %: 80 %
Platelets: 194 10*3/uL (ref 150–400)
RBC: 4.19 MIL/uL — ABNORMAL LOW (ref 4.22–5.81)
RDW: 14.4 % (ref 11.5–15.5)
WBC: 11.2 10*3/uL — ABNORMAL HIGH (ref 4.0–10.5)
nRBC: 0 % (ref 0.0–0.2)

## 2022-08-03 LAB — BASIC METABOLIC PANEL
Anion gap: 11 (ref 5–15)
BUN: 15 mg/dL (ref 8–23)
CO2: 22 mmol/L (ref 22–32)
Calcium: 8.5 mg/dL — ABNORMAL LOW (ref 8.9–10.3)
Chloride: 107 mmol/L (ref 98–111)
Creatinine, Ser: 1.19 mg/dL (ref 0.61–1.24)
GFR, Estimated: >60 mL/min (ref >60)
Glucose, Bld: 128 mg/dL — ABNORMAL HIGH (ref 70–99)
Potassium: 4.2 mmol/L (ref 3.5–5.1)
Sodium: 140 mmol/L (ref 135–145)

## 2022-08-03 MED ORDER — AMOXICILLIN-POT CLAVULANATE 875-125 MG PO TABS: 1.0000 | ORAL_TABLET | Freq: Two times a day (BID) | ORAL | 0 refills | Status: DC

## 2022-08-03 NOTE — Discharge Instructions (Signed)
Your x-ray did not show any evidence of pneumonia.  You do have elevated white blood cell count which is consistent with an infection.  We are going to start you on an antibiotic.  Take Augmentin twice daily for 7 days.  Continue your breathing medications including your Trelegy Ellipta and albuterol.  Please follow-up with your primary care next week to have your blood counts rechecked and make sure that you are improving.  If at any point anything worsens and you develop increased shortness of breath, worsening cough, weakness, nausea, vomiting, chest pain you need to go to the emergency room.

## 2022-08-03 NOTE — ED Triage Notes (Signed)
Pt c/o cough and congestion x5days  Pt states that he is coughing green mucus

## 2022-08-03 NOTE — ED Provider Notes (Signed)
[facility]    CSN: [id number] Arrival date & time: 08/03/22  1000      History   Chief Complaint Chief Complaint  Patient presents with   Cough   Nasal Congestion    HPI Clifford Martin is a 81 y.o. male.   Patient presents today with a 5-day history of cough that is worsened in the past several days.  He reports productive cough with thick purulent sputum.  Reports associated shortness of breath but this responds well to his albuterol and Trelegy Ellipta.  Reports he only uses Trelegy Ellipta when he is sick but has been using it since symptoms began.  He is followed for restrictive airway disease and COPD by pulmonology.  Denies any recent antibiotic use or recent hospitalization for COPD exacerbation.  He is up-to-date on COVID-19 immunizations.  Denies any known sick contacts.  He has not been taking any over-the-counter medication for symptom management.  He does have a history of cardiovascular disease but denies any chest pain or dyspnea on exertion.  He is eating and drinking normally.    Past Medical History:  Diagnosis Date   Anemia    Arthritis    CHF (congestive heart failure) (HCC)    COPD (chronic obstructive pulmonary disease) (HCC)    Coronary artery disease 11/14/2005   a.) NSTEMI 11/14/05 -> LHC 11/15/05: 100% RCA, 100% OM2, 75% RI, 75-95% LAD; consult CVTS. b.) 3v CABG 11/18/2005. c.) NSTEMI 09 -> LHC 90% RI -> 2.5x12 mm Microdriver BMS. d.) LHC 57/84/69: 70% pLAD, 80-90% mLAD -> 2.25x12 mm (dLAD) and 2.75x20 mm (pLAD) Promus DES. e.) LHC 12/31/15: 90% pRCA, CTO mRCA ->3.5x16 mm pRCA and 2.5x32 mm Promus  DES. f.) LHC 08/31/18: 90% RPLB ->2.5x30 mm Res Onxy DES   Dyspnea    GERD (gastroesophageal reflux disease)    History of atrial flutter 11/19/2005   a.) single episode following extubation from CABG --> DCCV x single 200J shock restored NSR.   History of heart artery stent 2009   6 TOTAL --> a.) 2.5 x 12 Microdriver BMS to RI (6295), b.)  overlapping 2.25 x 12 mm (dLAD) and 2.75 x 20 mm (pLAD) Promus Premier DES (02/01/2014). c.) 3.5 x 16 mm (PRCA) and 2.5 x 32 mm(mRCA) Promus Premier (12/31/2015). d.) 2.5 x 30 mm Resolute Onxy to RPLB (08/31/2018)   Hyperlipidemia    Hypertension    Incomplete right bundle branch block (RBBB)    Ischemic cardiomyopathy    Long term current use of antithrombotics/antiplatelets    a.) DAPT therapy (ASA + clopidogrel)   NSTEMI (non-ST elevated myocardial infarction) ([place]) 11/14/2005   a.) LHC 11/15/2005: 100% RCA, 100% OM2, 75% RI, 75-95% LAD; CVTS consulted. b.) 3v CABG 11/19/2015: LIMA-LAD, SVG-OM2, SVG-RCA; severe pericarditis noted intraoperatively. LAD & OM intramyocardial --> required high doses of epi and milrinone to come off bypass. Extubated POD1, however developed resp distress and A.flutter --> DCCV x 1 and reintubated. Weaned from ventilator on POD4.   NSTEMI (non-ST elevated myocardial infarction) ([place]) 2009   a.) LHC --> 90% RI --> PCI performed placing a 2.5 x 12 mm Microdriver BMS.   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   a.) LIMA-LAD, SVG-OM2, SVG-RCA   Sepsis ([place])    Sleep apnea    a.) no nocturnal PAP therapy   T2DM (type 2 diabetes mellitus) [facility])     Patient Active Problem List   Diagnosis Date Noted   Choledocholithiasis 09/18/2021   Agitation 08/31/2021  Sepsis ([place]) 08/29/2021   Elevated troponin 08/28/2021   Acute respiratory failure with hypoxia ([place]) 08/28/2021   Severe sepsis ([place]) 08/28/2021   Hypertension    Diabetes mellitus without complication ([place])    AKI (acute kidney injury) ([place])    Pulmonary nodules    Cholelithiasis    Abnormal LFTs    Acute metabolic encephalopathy    Dyspnea on exertion 08/24/2021   Syncope 04/21/2021   Healthcare maintenance 04/13/2021   Mild intermittent asthma without complication 62/37/6283   B12 deficiency 03/31/2018   Rosacea 03/31/2018   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/2016   Neck pain, chronic 01/27/2015   Psoriasis 09/11/2014   Lumbar pain with radiation down both legs 04/30/2014   Testosterone deficiency 11/05/2013   Erectile dysfunction 12/13/2012   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   CAD (coronary artery disease), native coronary artery 01/19/2011   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   Hyperlipidemia 01/19/2011    Past Surgical History:  Procedure Laterality Date   CARDIAC CATHETERIZATION Left 11/15/2005   Procedure: CARDIAC CATHETERIZATION; Location: [facility]; Surgeon: Janese Banks, MD   COLONOSCOPY  11/30/2013   COLONOSCOPY  05/05/2017   CORONARY ANGIOPLASTY WITH STENT PLACEMENT Left 2009   Procedure: CORONARY ANGIOPLASTY WITH STENT PLACEMENT (2.5 x 12 mm Microdriver BMS to RI)   CORONARY ANGIOPLASTY WITH STENT PLACEMENT Left 02/01/2014   Procedure: CORONARY ANGIOPLASTY WITH STENT PLACEMENT (overlapping 2.25 x 12 mm dLAD and 2.75 x 20 mm pLAD Promus Premier DES): Location: [facility]; Surgeon: Trula Slade, MD   CORONARY ANGIOPLASTY WITH STENT PLACEMENT Left 12/31/2015   Procedure: STAGED CORONARY ANGIOPLASTY WITH STENT PLACEMENT (3.5 x 16 mm pRCA and 2.5 x 32 mm mRCA Promus Premier DES); Location: [facility]; Surgeon: Trula Slade, MD   CORONARY ANGIOPLASTY WITH STENT PLACEMENT  08/31/2018   Procedure: CORONARY ANGIOPLASTY WITH STENT PLACEMENT (2.5 x 30 mm Resolute Onyx DES to RPLB); [facility]: [facility]; Surgeon: Jacques Earthly, MD   CORONARY ARTERY BYPASS GRAFT  11/18/2004   Procedure: 3v CORONARY ARTERY BYPASS GRAFT; Location: [facility]; Surgeon: Gordy Clement, MD)   ERCP  10/06/2021   HERNIA REPAIR Bilateral    LEFT HEART CATH AND CORS/GRAFTS ANGIOGRAPHY Left 12/23/2015   Procedure: LEFT HEART CATH AND CORS/GRAFTS ANGIOGRAPHY; Location: [facility]; Surgeon: Trula Slade, MD   UMBILICAL HERNIA REPAIR N/A 10/30/2021   Procedure: HERNIA REPAIR UMBILICAL ADULT;  Surgeon: Jules Husbands, MD;  Location: [facility];  Service: General;  Laterality:  N/A;       Home Medications    Prior to Admission medications   Medication Sig Start Date End Date Taking? Authorizing Provider  albuterol (VENTOLIN HFA) 108 (90 Base) MCG/ACT inhaler Inhale 2 puffs into the lungs every 6 (six) hours as needed for shortness of breath. 10/07/21  Yes [provider]  amoxicillin-clavulanate (AUGMENTIN) 875-125 MG tablet Take 1 tablet by mouth every 12 (twelve) hours. 08/03/22  Yes Saafir Abdullah, Derry Skill, PA-C  aspirin 81 MG EC tablet Take 81 mg by mouth daily. Swallow whole.   Yes [provider]  atorvastatin (LIPITOR) 40 MG tablet Take 40 mg by mouth at bedtime. 07/23/21  Yes [provider]  famotidine (PEPCID) 20 MG tablet Take 20 mg by mouth as needed for heartburn or indigestion.   Yes [provider]  furosemide (LASIX) 20 MG tablet Take 20 mg by mouth daily. 07/24/21  Yes [provider]  gabapentin (NEURONTIN) 300 MG capsule Take 300-600 mg  by mouth See admin instructions. Take 300 mg morning and 600 mg at night 07/24/21  Yes [provider]  ibuprofen (ADVIL) 200 MG tablet Take 400 mg by mouth every 6 (six) hours as needed for headache or moderate pain.   Yes [provider]  methocarbamol (ROBAXIN) 500 MG tablet Take 500 mg by mouth every 8 (eight) hours as needed for muscle spasms.   Yes [provider]  morphine (MS CONTIN) 30 MG 12 hr tablet Take 30 mg by mouth every 12 (twelve) hours. 08/10/21  Yes [provider]  Multiple Vitamin (MULTIVITAMIN WITH MINERALS) TABS tablet Take 1 tablet by mouth daily.   Yes [provider]  naloxone (NARCAN) nasal spray 4 mg/0.1 mL Place 1 spray into the nose as needed. One spray in either nostril once for known/suspected overdose. May repeat every 2 to 3 minutes in alternating nostrils until EMS arrives. 04/22/21  Yes [provider]  traZODone (DESYREL) 50 MG tablet Take 100-150 mg by mouth at bedtime. 07/01/21  Yes [provider]  lisinopril (ZESTRIL) 5 MG tablet Take 5 mg by mouth 2 (two) times daily. 04/28/21 04/28/22  [provider]  metFORMIN (GLUCOPHAGE) 1000 MG tablet Take 1,000 mg by mouth at bedtime. 04/22/21 04/17/22  [provider]  metoprolol tartrate (LOPRESSOR) 50 MG tablet Take 50 mg by mouth 2 (two) times daily. 12/09/20 12/09/21  [provider]    Family History History reviewed. No pertinent family history.  Social History Social History   Tobacco Use   Smoking status: Former    Packs/day: 2.00    Years: 35.00    Total pack years: 70.00    Types: Cigarettes    Quit date: 2002    Years since quitting: 21.9    Passive exposure: Never   Smokeless tobacco: Never  Vaping Use   Vaping Use: Never used  Substance Use Topics   Alcohol use: Not Currently   Drug use: Never     Allergies   Ativan [lorazepam] and Nitroglycerin   Review of Systems Review of Systems  Constitutional:  Positive for activity change. Negative for appetite change, fatigue and fever.  HENT:  Positive for congestion. Negative for sinus pressure, sneezing and sore throat.   Respiratory:  Positive for cough and shortness of breath. Negative for chest tightness and wheezing.   Cardiovascular:  Negative for chest pain.  Gastrointestinal:  Negative for abdominal pain, diarrhea, nausea and vomiting.     Physical Exam Triage Vital Signs ED Triage Vitals  Enc Vitals Group     BP 08/03/22 1111 123/66     Pulse Rate 08/03/22 1111 82     Resp 08/03/22 1111 18     Temp 08/03/22 1111 98.4 F (36.9 C)     Temp Source 08/03/22 1111 Oral     SpO2 08/03/22 1111 94 %     Weight 08/03/22 1107 175 lb (79.4 kg)     Height 08/03/22 1107 '5\' 10"'$  (1.778 m)     Head Circumference --      Peak Flow --      Pain Score 08/03/22 1107 0     Pain Loc --      Pain Edu? --      Excl. in [place]? --    No data found.  Updated Vital Signs BP 123/66 (BP Location: Left Arm)   Pulse 82   Temp 98.4 F  (36.9 C) (Oral)   Resp 18   Ht 5'  10" (1.778 m)   Wt 175 lb (79.4 kg)   SpO2 94%   BMI 25.11 kg/m   Visual Acuity Right Eye Distance:   Left Eye Distance:   Bilateral Distance:    Right Eye Near:   Left Eye Near:    Bilateral Near:     Physical Exam Vitals reviewed.  Constitutional:      General: He is awake.     Appearance: Normal appearance. He is well-developed. He is not ill-appearing.     Comments: Very pleasant male appears stated age in no acute distress sitting comfortably in exam room  HENT:     Head: Normocephalic and atraumatic.     Right Ear: Tympanic membrane, ear canal and external ear normal. Tympanic membrane is not erythematous or bulging.     Left Ear: Tympanic membrane, ear canal and external ear normal. Tympanic membrane is not erythematous or bulging.     Nose: Nose normal.     Mouth/Throat:     Pharynx: Uvula midline. No oropharyngeal exudate or posterior oropharyngeal erythema.  Cardiovascular:     Rate and Rhythm: Normal rate and regular rhythm.     Heart sounds: Normal heart sounds, S1 normal and S2 normal. No murmur heard. Pulmonary:     Effort: Pulmonary effort is normal. No accessory muscle usage or respiratory distress.     Breath sounds: No stridor. Examination of the left-lower field reveals rales. Rales present. No wheezing or rhonchi.  Abdominal:     General: Bowel sounds are normal.     Palpations: Abdomen is soft.     Tenderness: There is no abdominal tenderness.  Neurological:     Mental Status: He is alert.  Psychiatric:        Behavior: Behavior is cooperative.      UC Treatments / Results  Labs (all labs ordered are listed, but only abnormal results are displayed) Labs Reviewed  CBC WITH DIFFERENTIAL/PLATELET - Abnormal; Notable for the following components:      Result Value   WBC 11.2 (*)    RBC 4.19 (*)    Hemoglobin 12.8 (*)    HCT 38.0 (*)    Neutro Abs 8.9 (*)    All other components within normal limits  BASIC  METABOLIC PANEL - Abnormal; Notable for the following components:   Glucose, Bld 128 (*)    Calcium 8.5 (*)    All other components within normal limits    EKG   Radiology DG Chest 2 View  Result Date: 08/03/2022 CLINICAL DATA:  Left-sided rales.  Cough and congestion for 5 days EXAM: CHEST - 2 VIEW COMPARISON:  08/29/2021 and the CT of 08/28/2021 FINDINGS: Median sternotomy for CABG. Mild posterior pleural thickening on the lateral view likely corresponds to right-sided pleural thickening on the prior CT. Midline trachea. Mild cardiomegaly. No pleural effusion or pneumothorax. Diffuse, lower lung predominant interstitial thickening is chronic and likely related to a combination of emphysema/chronic bronchitis and bibasilar scarring. No well-defined lobar consolidation. IMPRESSION: No evidence of pneumonia. Cardiomegaly with chronic interstitial thickening and scarring. Emphysema (ICD10-J43.9). Electronically Signed   By: Abigail Miyamoto M.D.   On: 08/03/2022 11:45    Procedures Procedures (including critical care time)  Medications Ordered in UC Medications - No data to display  Initial Impression / Assessment and Plan / UC Course  I have reviewed the triage vital signs and the nursing notes.  Pertinent labs & imaging results that were available during my care of the patient were  reviewed by me and considered in my medical decision making (see chart for details).     Patient is well-appearing, afebrile, nontoxic, nontachycardic.  No indication for viral testing as he has been symptomatic for over 5 days and this would not change management.  Chest x-ray shows no acute cardiopulmonary disease.  CBC showed leukocytosis with mild anemia.  BMP showed stable kidney function with creatinine clearance of 57.44 mL/min.  No indication for medication adjustment based on kidney function today.  Will cover for COPD exacerbation with Augmentin twice daily for 7 days.  He was encouraged to continue  maintenance and rescue medications as prescribed by his pulmonologist.  Recommend close follow-up with his PCP/pulmonologist.  If his symptoms are not improving within a few days or if he has any worsening symptoms he needs to be seen immediately including shortness of breath, chest pain, fever, nausea, vomiting.  Strict return precautions given.  Work excuse note provided.  Final Clinical Impressions(s) / UC Diagnoses   Final diagnoses:  COPD exacerbation [facility])     Discharge Instructions      Your x-ray did not show any evidence of pneumonia.  You do have elevated white blood cell count which is consistent with an infection.  We are going to start you on an antibiotic.  Take Augmentin twice daily for 7 days.  Continue your breathing medications including your Trelegy Ellipta and albuterol.  Please follow-up with your primary care next week to have your blood counts rechecked and make sure that you are improving.  If at any point anything worsens and you develop increased shortness of breath, worsening cough, weakness, nausea, vomiting, chest pain you need to go to the emergency room.     ED Prescriptions     Medication Sig Dispense Auth. Provider   amoxicillin-clavulanate (AUGMENTIN) 875-125 MG tablet Take 1 tablet by mouth every 12 (twelve) hours. 14 tablet Melda Mermelstein, Derry Skill, PA-C      PDMP not reviewed this encounter.   Terrilee Croak, PA-C 08/03/22 1302

## 2023-02-21 ENCOUNTER — Ambulatory Visit
Admission: EM | Admit: 2023-02-21 | Discharge: 2023-02-21 | Disposition: A | Discharge status: Home / Self Care | Payer: PPO | Attending: Physician Assistant | Admitting: Physician Assistant

## 2023-02-21 DIAGNOSIS — M79671 Pain in right foot: Secondary | ICD-10-CM

## 2023-02-21 DIAGNOSIS — M109 Gout, unspecified: Secondary | ICD-10-CM

## 2023-02-21 MED ORDER — PREDNISONE 20 MG PO TABS: 40.0000 mg | ORAL_TABLET | Freq: Every day | ORAL | 0 refills | Status: AC

## 2023-02-21 NOTE — Discharge Instructions (Addendum)
-  Start prednisone.  Elevate and ice extremity.  Continue home pain medication.  Should be feeling better multiple days.

## 2023-02-21 NOTE — ED Provider Notes (Signed)
[facility]    CSN: [id number] Arrival date & time: 02/21/23  1027      History   Chief Complaint Chief Complaint  Patient presents with   Toe Pain    HPI Clifford Martin is a 82 y.o. male presenting for suspected gout flare up of the right great toe x 4 days. Reports pain, redness and swelling. No injury. Believes this is consistent with gout flare up. States he has normally takes prednisone for flare ups and admits it has been awhile since last flare up.  Patient's medical history is significant for COPD, CAD with previous NSTEMI's and history of stents/CABG, ischemic cardiomyopathy, hypertension, hyperlipidemia, CHF, type 2 diabetes, gout, and sleep apnea.  Patient is also on morphine long-term for chronic neck pain.  HPI  Past Medical History:  Diagnosis Date   Anemia    Arthritis    CHF (congestive heart failure) (HCC)    COPD (chronic obstructive pulmonary disease) (HCC)    Coronary artery disease 11/14/2005   a.) NSTEMI 11/14/05 -> LHC 11/15/05: 100% RCA, 100% OM2, 75% RI, 75-95% LAD; consult CVTS. b.) 3v CABG 11/18/2005. c.) NSTEMI 09 -> LHC 90% RI -> 2.5x12 mm Microdriver BMS. d.) LHC 02/01/14: 70% pLAD, 80-90% mLAD -> 2.25x12 mm (dLAD) and 2.75x20 mm (pLAD) Promus DES. e.) LHC 12/31/15: 90% pRCA, CTO mRCA ->3.5x16 mm pRCA and 2.5x32 mm Promus  DES. f.) LHC 08/31/18: 90% RPLB ->2.5x30 mm Res Onxy DES   Dyspnea    GERD (gastroesophageal reflux disease)    History of atrial flutter 11/19/2005   a.) single episode following extubation from CABG --> DCCV x single 200J shock restored NSR.   History of heart artery stent 2009   6 TOTAL --> a.) 2.5 x 12 Microdriver BMS to RI (2009), b.) overlapping 2.25 x 12 mm (dLAD) and 2.75 x 20 mm (pLAD) Promus Premier DES (02/01/2014). c.) 3.5 x 16 mm (PRCA) and 2.5 x 32 mm(mRCA) Promus Premier (12/31/2015). d.) 2.5 x 30 mm Resolute Onxy to RPLB (08/31/2018)   Hyperlipidemia    Hypertension    Incomplete right bundle  branch block (RBBB)    Ischemic cardiomyopathy    Long term current use of antithrombotics/antiplatelets    a.) DAPT therapy (ASA + clopidogrel)   NSTEMI (non-ST elevated myocardial infarction) (HCC) 11/14/2005   a.) LHC 11/15/2005: 100% RCA, 100% OM2, 75% RI, 75-95% LAD; CVTS consulted. b.) 3v CABG 11/19/2015: LIMA-LAD, SVG-OM2, SVG-RCA; severe pericarditis noted intraoperatively. LAD & OM intramyocardial --> required high doses of epi and milrinone to come off bypass. Extubated POD1, however developed resp distress and A.flutter --> DCCV x 1 and reintubated. Weaned from ventilator on POD4.   NSTEMI (non-ST elevated myocardial infarction) (HCC) 2009   a.) LHC --> 90% RI --> PCI performed placing a 2.5 x 12 mm Microdriver BMS.   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   a.) LIMA-LAD, SVG-OM2, SVG-RCA   Sepsis (HCC)    Sleep apnea    a.) no nocturnal PAP therapy   T2DM (type 2 diabetes mellitus) (HCC)     Patient Active Problem List   Diagnosis Date Noted   Choledocholithiasis 09/18/2021   Agitation 08/31/2021   Sepsis (HCC) 08/29/2021   Elevated troponin 08/28/2021   Acute respiratory failure with hypoxia (HCC) 08/28/2021   Severe sepsis (HCC) 08/28/2021   Hypertension    Diabetes mellitus without complication (HCC)    AKI (acute kidney injury) (HCC)    Pulmonary nodules    Cholelithiasis  Abnormal LFTs    Acute metabolic encephalopathy    Dyspnea on exertion 08/24/2021   Syncope 04/21/2021   Healthcare maintenance 04/13/2021   Mild intermittent asthma without complication 08/21/2019   B12 deficiency 03/31/2018   Rosacea 03/31/2018   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   Neck pain, chronic 01/27/2015   Psoriasis 09/11/2014   Lumbar pain with radiation down both legs 04/30/2014   Testosterone deficiency 11/05/2013   Erectile dysfunction 12/13/2012   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   CAD (coronary artery disease), native  coronary artery 01/19/2011   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/19/2011   Hyperlipidemia 01/19/2011    Past Surgical History:  Procedure Laterality Date   CARDIAC CATHETERIZATION Left 11/15/2005   Procedure: CARDIAC CATHETERIZATION; Location: [facility]; Surgeon: Smith Robert, MD   COLONOSCOPY  11/30/2013   COLONOSCOPY  05/05/2017   CORONARY ANGIOPLASTY WITH STENT PLACEMENT Left 2009   Procedure: CORONARY ANGIOPLASTY WITH STENT PLACEMENT (2.5 x 12 mm Microdriver BMS to RI)   CORONARY ANGIOPLASTY WITH STENT PLACEMENT Left 02/01/2014   Procedure: CORONARY ANGIOPLASTY WITH STENT PLACEMENT (overlapping 2.25 x 12 mm dLAD and 2.75 x 20 mm pLAD Promus Premier DES): Location: [facility]; Surgeon: Orpha Bur, MD   CORONARY ANGIOPLASTY WITH STENT PLACEMENT Left 12/31/2015   Procedure: STAGED CORONARY ANGIOPLASTY WITH STENT PLACEMENT (3.5 x 16 mm pRCA and 2.5 x 32 mm mRCA Promus Premier DES); Location: [facility]; Surgeon: Orpha Bur, MD   CORONARY ANGIOPLASTY WITH STENT PLACEMENT  08/31/2018   Procedure: CORONARY ANGIOPLASTY WITH STENT PLACEMENT (2.5 x 30 mm Resolute Onyx DES to RPLB); Locaiton: [facility]; Surgeon: Willene Hatchet, MD   CORONARY ARTERY BYPASS GRAFT  11/18/2004   Procedure: 3v CORONARY ARTERY BYPASS GRAFT; Location: [facility]; Surgeon: Georgia Dom, MD)   ERCP  10/06/2021   HERNIA REPAIR Bilateral    LEFT HEART CATH AND CORS/GRAFTS ANGIOGRAPHY Left 12/23/2015   Procedure: LEFT HEART CATH AND CORS/GRAFTS ANGIOGRAPHY; Location: [facility]; Surgeon: Orpha Bur, MD   UMBILICAL HERNIA REPAIR N/A 10/30/2021   Procedure: HERNIA REPAIR UMBILICAL ADULT;  Surgeon: Leafy Ro, MD;  Location: [facility];  Service: General;  Laterality: N/A;       Home Medications    Prior to Admission medications   Medication Sig Start Date End Date Taking? Authorizing Provider  predniSONE (DELTASONE) 20 MG tablet Take 2 tablets (40 mg total) by mouth daily for 5 days. 02/21/23 02/26/23 Yes Shirlee Latch, PA-C  albuterol  (VENTOLIN HFA) 108 (90 Base) MCG/ACT inhaler Inhale 2 puffs into the lungs every 6 (six) hours as needed for shortness of breath. 10/07/21   [provider]  aspirin 81 MG EC tablet Take 81 mg by mouth daily. Swallow whole.    [provider]  atorvastatin (LIPITOR) 40 MG tablet Take 40 mg by mouth at bedtime. 07/23/21   [provider]  famotidine (PEPCID) 20 MG tablet Take 20 mg by mouth as needed for heartburn or indigestion.    [provider]  furosemide (LASIX) 20 MG tablet Take 20 mg by mouth daily. 07/24/21   [provider]  gabapentin (NEURONTIN) 300 MG capsule Take 300-600 mg by mouth See admin instructions. Take 300 mg morning and 600 mg at night 07/24/21   [provider]  ibuprofen (ADVIL) 200 MG tablet Take 400 mg by mouth every 6 (six) hours as needed for headache or moderate pain.    [provider]  lisinopril (ZESTRIL)  5 MG tablet Take 5 mg by mouth 2 (two) times daily. 04/28/21 04/28/22  [provider]  metFORMIN (GLUCOPHAGE) 1000 MG tablet Take 1,000 mg by mouth at bedtime. 04/22/21 04/17/22  [provider]  methocarbamol (ROBAXIN) 500 MG tablet Take 500 mg by mouth every 8 (eight) hours as needed for muscle spasms.    [provider]  metoprolol tartrate (LOPRESSOR) 50 MG tablet Take 50 mg by mouth 2 (two) times daily. 12/09/20 12/09/21  [provider]  morphine (MS CONTIN) 30 MG 12 hr tablet Take 30 mg by mouth every 12 (twelve) hours. 08/10/21   [provider]  Multiple Vitamin (MULTIVITAMIN WITH MINERALS) TABS tablet Take 1 tablet by mouth daily.    [provider]  naloxone [facility]) nasal spray 4 mg/0.1 mL Place 1 spray into the nose as needed. One spray in either nostril once for known/suspected overdose. May repeat every 2 to 3 minutes in alternating nostrils until EMS arrives. 04/22/21   [provider]  traZODone (DESYREL) 50 MG tablet Take 100-150 mg by  mouth at bedtime. 07/01/21   [provider]    Family History History reviewed. No pertinent family history.  Social History Social History   Tobacco Use   Smoking status: Former    Packs/day: 2.00    Years: 35.00    Additional pack years: 0.00    Total pack years: 70.00    Types: Cigarettes    Quit date: 2002    Years since quitting: 22.4    Passive exposure: Never   Smokeless tobacco: Never  Vaping Use   Vaping Use: Never used  Substance Use Topics   Alcohol use: Not Currently   Drug use: Never     Allergies   Ativan [lorazepam], Nitroglycerin, and Zolpidem   Review of Systems Review of Systems  Constitutional:  Negative for fatigue and fever.  Musculoskeletal:  Positive for arthralgias and joint swelling.  Skin:  Positive for color change. Negative for wound.  Neurological:  Negative for weakness and numbness.     Physical Exam Triage Vital Signs ED Triage Vitals  Enc Vitals Group     BP      Pulse      Resp      Temp      Temp src      SpO2      Weight      Height      Head Circumference      Peak Flow      Pain Score      Pain Loc      Pain Edu?      Excl. in GC?    No data found.  Updated Vital Signs BP 125/60 (BP Location: Left Arm)   Pulse 67   Temp 97.8 F (36.6 C) (Oral)   Resp 18   SpO2 94%      Physical Exam Vitals and nursing note reviewed.  Constitutional:      General: He is not in acute distress.    Appearance: Normal appearance. He is well-developed. He is not ill-appearing.  HENT:     Head: Normocephalic and atraumatic.  Eyes:     General: No scleral icterus.    Conjunctiva/sclera: Conjunctivae normal.  Cardiovascular:     Rate and Rhythm: Normal rate and regular rhythm.     Pulses: Normal pulses.  Pulmonary:     Effort: Pulmonary effort is normal. No respiratory distress.  Musculoskeletal:     Cervical  back: Neck supple.     Right foot: Decreased range of motion. Swelling and tenderness present.  Normal pulse.     Comments: TTP right 1st MTP joint with erythema/swelling as well as TTP.  Skin:    General: Skin is warm and dry.     Capillary Refill: Capillary refill takes less than 2 seconds.  Neurological:     General: No focal deficit present.     Mental Status: He is alert. Mental status is at baseline.     Motor: No weakness.     Gait: Gait normal.  Psychiatric:        Mood and Affect: Mood normal.        Behavior: Behavior normal.      UC Treatments / Results  Labs (all labs ordered are listed, but only abnormal results are displayed) Labs Reviewed - No data to display  EKG   Radiology No results found.  Procedures Procedures (including critical care time)  Medications Ordered in UC Medications - No data to display  Initial Impression / Assessment and Plan / UC Course  I have reviewed the triage vital signs and the nursing notes.  Pertinent labs & imaging results that were available during my care of the patient were reviewed by me and considered in my medical decision making (see chart for details).   82 y/o male presents for right great toe pain/swelling and suspected gout flare up x 4 days.  Unsure as to the cause of the trigger of his symptoms.  Has tried home morphine without relief.  Feels symptoms are consistent with gout flareup.  States he has taken prednisone before for flareups I would like to take that again.  Presentation today is consistent with acute gout flare.  He has swelling, erythema, warmth and tenderness of the right first MTP joint.  Sent prednisone to pharmacy and advised to continue home pain medication, follow RICE guidelines.  Reviewed return precautions.   Final Clinical Impressions(s) / UC Diagnoses   Final diagnoses:  Acute gout of right foot, unspecified cause  Right foot pain     Discharge Instructions      -Start prednisone.  Elevate and ice extremity.  Continue home pain medication.  Should be feeling better multiple  days.     ED Prescriptions     Medication Sig Dispense Auth. Provider   predniSONE (DELTASONE) 20 MG tablet Take 2 tablets (40 mg total) by mouth daily for 5 days. 10 tablet Shirlee Latch, PA-C      I have reviewed the PDMP during this encounter.   Shirlee Latch, PA-C 02/21/23 1106

## 2023-02-21 NOTE — ED Triage Notes (Signed)
Patient presents to UC for right big toe pain x 4 days. Treating with morphine, no relief.

## 2023-04-04 ENCOUNTER — Telehealth: Payer: Self-pay

## 2023-04-04 NOTE — Telephone Encounter (Signed)
Entered in error

## 2023-05-17 NOTE — Discharge Summary (Signed)
 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 *   OR Procedures:   ARTHRODES, ANT INTRBDY, INCL DISC SPC PREP, DISCECT, OSTEOPHYT/DECOMPRESS SPINL CRD &/OR NRV RT, CRV BLO C2 ANTERIOR INSTRUMENTATION; 2 TO 3 VERTEBRAL SEGMENTS INSERT INTERBODY BIOMECHANICAL DEVICE(S) WITH INTEGRAL ANTERIOR INSTRUMENT FOR DEVICE ANCHORING, WHEN PERFORMED, TO INTERVERTEBRAL DISC SPACE IN CONJUNCTION WITH INTERBODY ARTHRODESIS, EACH INTERSPACE X1 ALLOGRAFT FOR SPINE SURGERY ONLY; MORSELIZED AUTOGRAFT/SPINE SURG ONLY (W/HARVEST GRAFT); LOCAL (EG, RIB/SPINOUS PROC, LAM FRGMT) OBTAIN FROM SAME INCIS CONTINUOUS INTRAOPERATIVE NEUROPHYSIOLOGY MONITORING IN OR Date 05/19/2023 -------------------   Ancillary Procedures: no procedures  Discharge Day Services: The patient was examined on the day of discharge. Discharge instructions were reviewed in detail and given verbal and written form. All medications were sent to the pharmacy. All patient questions were answered. The patient is eager for discharge today.  Subjective  No acute events overnight. Pain Controlled. No fever or chills. Had a headache earlier this morning that has resolved. No numbness or tingling. All questions answered and plan of care  discussed.  Objective Patient Vitals for the past 8 hrs:  BP Temp Temp src Pulse Resp SpO2  05/20/23 0453 119/48 36 C (96.8 F) Oral 87 17 91 %  05/20/23 0015 109/52 36.7 C (98 F) Temporal 79 18 95 %   No intake/output data recorded.  Physical Exam: General:  Alert and oriented, No acute distress Extremity/wound location: Anterior neck  Incision: incision well-approximated with dermabond -- minimal surrounding swelling  Neurovascular: Distal affected extremity is well perfused. Intact sensation to the affected hand in the median, ulnar, and radial nerve distribution.  Motor R L    Deltoid 5 5    Bicep 5 5    Tricep 5 5    WE 5 5    Grip 5 5    IO 5 5       Hospital Course: Patient was admitted postoperatively. Diet was advanced which was well tolerated. Patient was evaluated by physical therapy and was deemed appropriate for discharge. Foley was discontinued prior discharge and patient was voiding freely. Pain was well controlled on PO medication. Patient will follow up back in clinic as scheduled. Patient discharge in stable condition with no complication.   Due to the patient's age, medical comorbidities, and undergoing such an extensive orthopedic surgical procedure, it was determined at the time of their surgery that the patient would require a hospital stay for greater than two midnights. However, the postoperative multimodal pain regimen was extremely effective in controlling the patient's postoperative pain and the patient progressed with physical therapy and occupational therapy quicker than expected. The patient will discharge home on POD#1 with no complications.  I am in concurrence with occupational therapy and physical therapy's recommendations for durable medical equipment required for the patient to continue to safely rehabilitate at home upon discharge  Condition at Discharge: Improved Discharge Medications:    Your Medication List     STOP taking these  medications    BETASEPT  SURGICAL SCRUB 4 % external liquid Generic drug: chlorhexidine        START taking these medications    acetaminophen  325 MG tablet Commonly known as: Tylenol  Take 2 tablets (650 mg total) by mouth every four (4) hours as needed for pain.   cholecalciferol (vitamin D3 25 mcg (1,000 units)) 1,000 unit (25 mcg) tablet Commonly known as: cholecalciferol-25 mcg (1,000 unit) Take 1 tablet (25 mcg total) by mouth daily.   oxyCODONE  5 MG immediate release tablet Commonly known as: ROXICODONE  Take 1 tablet (5 mg total) by mouth every four (4) hours as needed for pain (Can take 0.5 tab for mild pain) for up to 7 days.   SENNA 8.6 mg Cap Generic drug: sennosides Take 8.6 mg by mouth two (2) times a day as needed.   TUMS 300 mg (750 mg) Chew Generic drug: calcium  carbonate Chew 1 tablet (750 mg total) two (2) times a day.       CHANGE how you take these medications    methocarbamol 500 MG tablet Commonly known as: ROBAXIN Take 1 tablet (500 mg total) by mouth two (2) times a day as needed. What changed: Another medication with the same name was added. Make sure you understand how and when to take each.   methocarbamol 500 MG tablet Commonly known as: ROBAXIN Take 1.5 tablets (750 mg total) by mouth four (4) times a day. What changed: You were already taking a medication with the same name, and this prescription was added. Make sure you understand how and when to take each.   polyethylene glycol 17 gram/dose powder Commonly known as: MIRALAX Dissolve 1 capful (17 g) in 4 to 8 oz of fluid and take by mouth daily. What changed: Another medication with the same name was added. Make sure you understand how and when to take each.   polyethylene glycol 17 gram packet Commonly known as: MIRALAX Take 17 g by mouth daily. What changed: You were already taking a medication with the same name, and this prescription was added. Make sure you understand how and when  to take each.       CONTINUE taking these medications    albuterol  90 mcg/actuation inhaler Commonly known as: PROVENTIL  HFA;VENTOLIN  HFA Inhale 2 puffs every four (4) hours as needed for wheezing.   aspirin  81 MG tablet Commonly known as: BAYER LOW DOSE ASPIRIN  Take 1 tablet (81 mg total) by mouth nightly. Frequency:QD   Dosage:81   MG  Instructions:  Note:Dose: 81 MG   atorvastatin  40 MG tablet Commonly known as: LIPITOR Take 1 tablet by mouth nightly   blood-glucose meter Misc Commonly known as: ACCU-CHEK AVIVA PLUS METER E11.9 test BS twice a day   BREZTRI AEROSPHERE 160-9-4.8 mcg/actuation [facility] Generic drug: budesonide-glycopyr-formoterol Inhale 2 puffs two (2) times a day.   clopidogrel  75 mg tablet Commonly known as: PLAVIX  Take 1 tablet (75 mg total) by mouth daily.   famotidine  20 MG tablet Commonly known as: PEPCID  Take 1 tablet (20 mg total) by mouth.   fluticasone propionate 50 mcg/actuation nasal spray Commonly known as: FLONASE use 1 spray in each nostril daily.   furosemide  20 MG tablet Commonly known as: LASIX  Take 1-2 tablets (20-40 mg total) by mouth daily as needed for swelling.   gabapentin  300 MG capsule Commonly known as: NEURONTIN  Take 2 capsules (600  mg total) by mouth two (2) times a day.   ibuprofen 200 MG tablet Commonly known as: ADVIL,MOTRIN Take 2 tablets (400 mg total) by mouth.   lisinopril  5 MG tablet Commonly known as: PRINIVIL ,ZESTRIL  Take 1 tablet (5 mg total) by mouth Two (2) times a day.   metFORMIN 1000 MG tablet Commonly known as: GLUCOPHAGE Take 1 tablet (1,000 mg total) by mouth nightly.   metoPROLOL  tartrate 50 MG tablet Commonly known as: Lopressor  Take 1 tablet (50 mg total) by mouth Two (2) times a day.   metroNIDAZOLE  0.75 % cream Commonly known as: METROCREAM  Apply to affected area(s) topically twice daily   montelukast 10 mg tablet Commonly known as: SINGULAIR Take 1 tablet (10 mg total) by mouth  nightly.   morphine  15 MG 12 hr tablet Commonly known as: MS Contin  Take 1 tablet (15 mg total) by mouth every eight (8) hours.   morphine  15 MG 12 hr tablet Commonly known as: MS Contin  Take 1 tablet (15 mg total) by mouth every eight (8) hours.   morphine  15 MG 12 hr tablet Commonly known as: MS Contin  Take 1 tablet (15 mg total) by mouth every eight (8) hours.   morphine  15 MG 12 hr tablet Commonly known as: MS Contin  Take 1 tablet (15 mg total) by mouth every eight (8) hours TBD 05/17/2023   naloxone 4 mg/actuation nasal spray Commonly known as: NARCAN One spray in either nostril once for known/suspected opioid overdose. May repeat every 2-3 minutes in alternating nostril til EMS arrives   omeprazole 20 MG capsule Commonly known as: PriLOSEC Take 1 capsule (20 mg total) by mouth daily.   pediatric multivitamin Chew tablet Chew 1 tablet daily.   traZODone  150 MG tablet Commonly known as: DESYREL  Take 1 tablet (150 mg total) by mouth nightly.        Pending Test Results:   Discharge Instructions:   Other Instructions     Discharge instructions     Vicenta RAMAN. Dena, MD Discharge Instructions - Anterior Cervical Decompression and Fusion  YOUR COLLAR You have been given 2 collars to wear postoperatively.  You must wear soft white collar at all times until seen in the office for your first post-operative visit.  The second brown collar is for showering.  SHOWERING You may shower once being discharged home The bandage on your neck may either stay on, or be removed.  It is waterproof, although Dr. Dena recommends leaving it on for at least 5 days after surgery. If it comes off before then, just leave it off, you do not need another one Hair washing is permissible while in the shower.  No soaking in a tub until seen in the office.  Just let the water gently roll over your skin No pools or prolonged soaking  EXERCISE You may walk or climb stairs as  tolerated.  Walking frequently after surgery will help prevent blood clots. Do NOT lift anything weighing greater than about 10 lbs.  (A gallon container of water weighs about 8.5 lbs) Try to avoid lifting or reaching above your head.   INCISION Once the dressing is removed, please make sure your incisions are checked at least once daily for signs and symptoms of infection.  If any of the below should occur, please call us . Drainage from incision site Opening of incision Increased redness and/or tenderness  Fevers greater than 101F You have sutures on your incision that need to be removed 10-14 days following your surgery.  Keep the white steri-strips over the incision intact until the wound check visit.  SLEEPING You may sleep in any position which makes you comfortable as long as your collar is securely in place.  Many patients find comfort sleeping in a recliner chair.  It is normal to have difficulty sleeping for the first several weeks following your surgery.  We recommend trying over-the-counter Benadryl or Tylenol  PM if needed.    EATING It is normal to have a sore throat and some difficulty swallowing dry solid foods after anterior cervical surgery.  This may persist for several weeks.   Eating soft foods like yogurt, ice cream and mashed potatoes can be more comfortable.  PAIN Take the prescribed pain medications only if you are having pain.  Try to minimize the amount taken. Do NOT take any anti-inflammatory medications (ibuprofen, Advil, Aleve, Motrin, etc.) for the first 10 weeks following your surgery.  These medications can prevent the spinal fusion from healing. To help alleviate persistent soreness between the shoulder blades, apply ice or warm moist compresses.  It is normal for this discomfort to persist for several weeks following your surgery.  Do not resume taking bisphosphonates (Fosamax, Actonel, etc.) for 8 weeks after your fusion surgery.  DRIVING You may NOT drive  a car until cleared by your physician.  You may not drive while wearing a neck brace.  If you have to take a long trip as a passenger, make sure to stop every 30 minutes so that you can walk around and stretch your legs.  Reclining the passenger seat can make the trip more comfortable.    FOLLOW-UP APPOINTMENTS Call [phone] or check MyChart ([email or web address] to confirm that you have a follow-up appointment 10-14 days after surgery.  IF YOU HAVE QUESTIONS or CONCERNS ...  During normal business hours, call our nurse Katie at [phone] or our office at [phone]. During nights/weekends, call [facility] at [phone] and ask for the on-call orthopaedic surgery resident. For non-urgent concerns, you can also contact our team with MyChart ([email or web address]    Patient admitted. No follow up required.    Follow Up instructions and Outpatient Referrals    Discharge instructions      Appointments which have been scheduled for you    Jul 05, 2023 8:45 AM (Arrive by 8:15 AM) XR CERVICAL SPINE AP AND LATERAL with IC DIAG RM 1 IMG DIAG  X-RAY IMAGING CENTER [facility]) [address] [place] [address] [phone]     Jul 05, 2023 9:15 AM (Arrive by 9:00 AM) RETURN  SPINE with Fonda Elsie Files, PA [facility] SPINE CTR NEUROSURG [facility] [address] [place] [facility] REGION) [address] Floor [place] [address] [phone]     Jul 13, 2023 11:00 AM (Arrive by 10:45 AM) RETURN LIPIDS with Okey Fairy Pesa, MD [facility] CARDIOLOGY [place] [facility] [place]) [address] [facility] [address] [place] [address] [phone]         Leonce GORMAN Kiang, MD, PGY-3  "

## 2023-05-19 HISTORY — PX: SPINE SURGERY: SHX786

## 2023-05-27 ENCOUNTER — Ambulatory Visit
Admission: EM | Admit: 2023-05-27 | Discharge: 2023-05-27 | Disposition: A | Discharge status: Home / Self Care | Payer: PPO

## 2023-05-27 DIAGNOSIS — M79671 Pain in right foot: Secondary | ICD-10-CM

## 2023-05-27 MED ORDER — DEXAMETHASONE SODIUM PHOSPHATE 10 MG/ML IJ SOLN
10.0000 mg | Freq: Once | INTRAMUSCULAR | Status: AC
  Administered 2023-05-27: 10 mg via INTRAMUSCULAR

## 2023-05-27 MED ORDER — METHYLPREDNISOLONE 4 MG PO TBPK: ORAL_TABLET | ORAL | 0 refills | Status: DC

## 2023-05-27 NOTE — ED Provider Notes (Signed)
[facility]    CSN: [id number] Arrival date & time: 05/27/23  1241      History   Chief Complaint Chief Complaint  Patient presents with   Foot Pain    HPI KATSUJI ESKER is a 82 y.o. male.   HPI  82 year old male with a past medical history significant for CAD, COPD, CHF, and diabetes presents for evaluation of pain and swelling to the top of his right foot does been going on for last 3 days.  He had a cervical fusion 1 week ago and reports that he has not been doing any heavy lifting or anything to cause injury.  He did notice a red spot on the top of his foot and he is unsure if he might have been bit by a spider.  He reports that he is taking oxycodone for arthritis and that the pain is well-controlled though he still has pain when he bears weight.  No numbness or tingling.  Past Medical History:  Diagnosis Date   Anemia    Arthritis    CHF (congestive heart failure) (HCC)    COPD (chronic obstructive pulmonary disease) (HCC)    Coronary artery disease 11/14/2005   a.) NSTEMI 11/14/05 -> LHC 11/15/05: 100% RCA, 100% OM2, 75% RI, 75-95% LAD; consult CVTS. b.) 3v CABG 11/18/2005. c.) NSTEMI 09 -> LHC 90% RI -> 2.5x12 mm Microdriver BMS. d.) LHC 02/01/14: 70% pLAD, 80-90% mLAD -> 2.25x12 mm (dLAD) and 2.75x20 mm (pLAD) Promus DES. e.) LHC 12/31/15: 90% pRCA, CTO mRCA ->3.5x16 mm pRCA and 2.5x32 mm Promus  DES. f.) LHC 08/31/18: 90% RPLB ->2.5x30 mm Res Onxy DES   Dyspnea    GERD (gastroesophageal reflux disease)    History of atrial flutter 11/19/2005   a.) single episode following extubation from CABG --> DCCV x single 200J shock restored NSR.   History of heart artery stent 2009   6 TOTAL --> a.) 2.5 x 12 Microdriver BMS to RI (2009), b.) overlapping 2.25 x 12 mm (dLAD) and 2.75 x 20 mm (pLAD) Promus Premier DES (02/01/2014). c.) 3.5 x 16 mm (PRCA) and 2.5 x 32 mm(mRCA) Promus Premier (12/31/2015). d.) 2.5 x 30 mm Resolute Onxy to RPLB (08/31/2018)    Hyperlipidemia    Hypertension    Incomplete right bundle branch block (RBBB)    Ischemic cardiomyopathy    Long term current use of antithrombotics/antiplatelets    a.) DAPT therapy (ASA + clopidogrel)   NSTEMI (non-ST elevated myocardial infarction) (HCC) 11/14/2005   a.) LHC 11/15/2005: 100% RCA, 100% OM2, 75% RI, 75-95% LAD; CVTS consulted. b.) 3v CABG 11/19/2015: LIMA-LAD, SVG-OM2, SVG-RCA; severe pericarditis noted intraoperatively. LAD & OM intramyocardial --> required high doses of epi and milrinone to come off bypass. Extubated POD1, however developed resp distress and A.flutter --> DCCV x 1 and reintubated. Weaned from ventilator on POD4.   NSTEMI (non-ST elevated myocardial infarction) (HCC) 2009   a.) LHC --> 90% RI --> PCI performed placing a 2.5 x 12 mm Microdriver BMS.   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   a.) LIMA-LAD, SVG-OM2, SVG-RCA   Sepsis (HCC)    Sleep apnea    a.) no nocturnal PAP therapy   T2DM (type 2 diabetes mellitus) [facility])     Patient Active Problem List   Diagnosis Date Noted   Choledocholithiasis 09/18/2021   Agitation 08/31/2021   Sepsis (HCC) 08/29/2021   Elevated troponin 08/28/2021   Acute respiratory failure with hypoxia (HCC) 08/28/2021   Severe sepsis (  HCC) 08/28/2021   Hypertension    Diabetes mellitus without complication (HCC)    AKI (acute kidney injury) (HCC)    Pulmonary nodules    Cholelithiasis    Abnormal LFTs    Acute metabolic encephalopathy    Dyspnea on exertion 08/24/2021   Syncope 04/21/2021   Healthcare maintenance 04/13/2021   Mild intermittent asthma without complication 08/21/2019   B12 deficiency 03/31/2018   Rosacea 03/31/2018   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   Neck pain, chronic 01/27/2015   Psoriasis 09/11/2014   Lumbar pain with radiation down both legs 04/30/2014   Testosterone deficiency 11/05/2013   Erectile dysfunction 12/13/2012   Osteoarthritis of multiple  joints 07/13/2011   CAD (coronary artery disease), native coronary artery 01/19/2011   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/19/2011   Hyperlipidemia 01/19/2011    Past Surgical History:  Procedure Laterality Date   CARDIAC CATHETERIZATION Left 11/15/2005   Procedure: CARDIAC CATHETERIZATION; Location: [facility]; Surgeon: Smith Robert, MD   COLONOSCOPY  11/30/2013   COLONOSCOPY  05/05/2017   CORONARY ANGIOPLASTY WITH STENT PLACEMENT Left 2009   Procedure: CORONARY ANGIOPLASTY WITH STENT PLACEMENT (2.5 x 12 mm Microdriver BMS to RI)   CORONARY ANGIOPLASTY WITH STENT PLACEMENT Left 02/01/2014   Procedure: CORONARY ANGIOPLASTY WITH STENT PLACEMENT (overlapping 2.25 x 12 mm dLAD and 2.75 x 20 mm pLAD Promus Premier DES): Location: [facility]; Surgeon: Orpha Bur, MD   CORONARY ANGIOPLASTY WITH STENT PLACEMENT Left 12/31/2015   Procedure: STAGED CORONARY ANGIOPLASTY WITH STENT PLACEMENT (3.5 x 16 mm pRCA and 2.5 x 32 mm mRCA Promus Premier DES); Location: [facility]; Surgeon: Orpha Bur, MD   CORONARY ANGIOPLASTY WITH STENT PLACEMENT  08/31/2018   Procedure: CORONARY ANGIOPLASTY WITH STENT PLACEMENT (2.5 x 30 mm Resolute Onyx DES to RPLB); Locaiton: [facility]; Surgeon: Willene Hatchet, MD   CORONARY ARTERY BYPASS GRAFT  11/18/2004   Procedure: 3v CORONARY ARTERY BYPASS GRAFT; Location: [facility]; Surgeon: Georgia Dom, MD)   ERCP  10/06/2021   HERNIA REPAIR Bilateral    LEFT HEART CATH AND CORS/GRAFTS ANGIOGRAPHY Left 12/23/2015   Procedure: LEFT HEART CATH AND CORS/GRAFTS ANGIOGRAPHY; Location: [facility]; Surgeon: Orpha Bur, MD   SPINE SURGERY  05/19/2023   UMBILICAL HERNIA REPAIR N/A 10/30/2021   Procedure: HERNIA REPAIR UMBILICAL ADULT;  Surgeon: Leafy Ro, MD;  Location: [facility];  Service: General;  Laterality: N/A;       Home Medications    Prior to Admission medications   Medication Sig Start Date End Date Taking? Authorizing Provider  albuterol (VENTOLIN HFA) 108 (90 Base) MCG/ACT inhaler  Inhale 2 puffs into the lungs every 6 (six) hours as needed for shortness of breath. 10/07/21  Yes [provider]  aspirin 81 MG EC tablet Take 81 mg by mouth daily. Swallow whole.   Yes [provider]  atorvastatin (LIPITOR) 40 MG tablet Take 40 mg by mouth at bedtime. 07/23/21  Yes [provider]  famotidine (PEPCID) 20 MG tablet Take 20 mg by mouth as needed for heartburn or indigestion.   Yes [provider]  furosemide (LASIX) 20 MG tablet Take 20 mg by mouth daily. 07/24/21  Yes [provider]  gabapentin (NEURONTIN) 300 MG capsule Take 300-600 mg by mouth See admin instructions. Take 300 mg morning and 600 mg at night 07/24/21  Yes [provider]  ibuprofen (ADVIL) 200 MG tablet Take 400 mg by mouth every 6 (six) hours as needed  for headache or moderate pain.   Yes [provider]  methocarbamol (ROBAXIN) 500 MG tablet Take 500 mg by mouth every 8 (eight) hours as needed for muscle spasms.   Yes [provider]  methylPREDNISolone (MEDROL DOSEPAK) 4 MG TBPK tablet Take according to the package insert. 05/27/23  Yes Becky Augusta, NP  morphine (MS CONTIN) 30 MG 12 hr tablet Take 30 mg by mouth every 12 (twelve) hours. 08/10/21  Yes [provider]  Multiple Vitamin (MULTIVITAMIN WITH MINERALS) TABS tablet Take 1 tablet by mouth daily.   Yes [provider]  naloxone (NARCAN) nasal spray 4 mg/0.1 mL Place 1 spray into the nose as needed. One spray in either nostril once for known/suspected overdose. May repeat every 2 to 3 minutes in alternating nostrils until EMS arrives. 04/22/21  Yes [provider]  oxyCODONE (OXY IR/ROXICODONE) 5 MG immediate release tablet Take by mouth. 05/20/23 05/27/23 Yes [provider]  traZODone (DESYREL) 50 MG tablet Take 100-150 mg by mouth at bedtime. 07/01/21  Yes [provider]  lisinopril (ZESTRIL) 5 MG tablet Take 5 mg by mouth 2 (two) times daily.  04/28/21 04/28/22  [provider]  metFORMIN (GLUCOPHAGE) 1000 MG tablet Take 1,000 mg by mouth at bedtime. 04/22/21 04/17/22  [provider]  metoprolol tartrate (LOPRESSOR) 50 MG tablet Take 50 mg by mouth 2 (two) times daily. 12/09/20 12/09/21  [provider]    Family History History reviewed. No pertinent family history.  Social History Social History   Tobacco Use   Smoking status: Former    Current packs/day: 0.00    Average packs/day: 2.0 packs/day for 35.0 years (70.0 ttl pk-yrs)    Types: Cigarettes    Start date: 51    Quit date: 2002    Years since quitting: 22.7    Passive exposure: Never   Smokeless tobacco: Never  Vaping Use   Vaping status: Never Used  Substance Use Topics   Alcohol use: Not Currently   Drug use: Never     Allergies   Ativan [lorazepam], Nitroglycerin, and Zolpidem   Review of Systems Review of Systems  Constitutional:  Negative for fever.  Musculoskeletal:  Positive for arthralgias and myalgias.  Skin:  Positive for color change. Negative for wound.     Physical Exam Triage Vital Signs ED Triage Vitals  Encounter Vitals Group     BP 05/27/23 1408 121/79     Systolic BP Percentile --      Diastolic BP Percentile --      Pulse Rate 05/27/23 1408 81     Resp --      Temp 05/27/23 1408 98.6 F (37 C)     Temp Source 05/27/23 1408 Oral     SpO2 05/27/23 1408 93 %     Weight 05/27/23 1407 170 lb (77.1 kg)     Height 05/27/23 1407 5\' 10"  (1.778 m)     Head Circumference --      Peak Flow --      Pain Score 05/27/23 1405 6     Pain Loc --      Pain Education --      Exclude from Growth Chart --    No data found.  Updated Vital Signs BP 121/79 (BP Location: Left Arm)   Pulse 81   Temp 98.6 F (37 C) (Oral)   Ht 5\' 10"  (1.778 m)   Wt 170 lb (77.1 kg)   SpO2 93%   BMI  24.39 kg/m   Visual Acuity Right Eye Distance:   Left Eye Distance:   Bilateral Distance:    Right Eye Near:   Left Eye  Near:    Bilateral Near:     Physical Exam Vitals and nursing note reviewed.  Constitutional:      Appearance: Normal appearance. He is not ill-appearing.  HENT:     Head: Normocephalic and atraumatic.  Musculoskeletal:        General: Swelling and tenderness present. No signs of injury.  Skin:    General: Skin is warm and dry.     Capillary Refill: Capillary refill takes less than 2 seconds.     Findings: Erythema present.  Neurological:     General: No focal deficit present.     Mental Status: He is alert and oriented to person, place, and time.      UC Treatments / Results  Labs (all labs ordered are listed, but only abnormal results are displayed) Labs Reviewed - No data to display  EKG   Radiology No results found.  Procedures Procedures (including critical care time)  Medications Ordered in UC Medications  dexamethasone (DECADRON) injection 10 mg (has no administration in time range)    Initial Impression / Assessment and Plan / UC Course  I have reviewed the triage vital signs and the nursing notes.  Pertinent labs & imaging results that were available during my care of the patient were reviewed by me and considered in my medical decision making (see chart for details).   Patient is a pleasant, nontoxic-appearing 82 year old male presenting for evaluation of 3 days worth of pain to the dorsal aspect of the right foot without any injury.  As you can see in image above, at the proximal lateral aspect of the foot there is a small area of erythema but this is not indurated, fluctuant, or hot.  The entire dorsum of the foot is mildly edematous and tender to palpation.  Cap refills less than 2 seconds and DP and PT pulses are 2+.  Patient typically gets gout in his foot though it is most often in the big toe.  He reports that he has not had any gout flares in his proximal foot in the past.  I will treat the patient for gout flare with an IM dose of Decadron here in  clinic and discharge him home with a Medrol Dosepak.  He should keep his foot elevated is much as possible decrease swelling and aid in pain relief.  If his symptoms do not improve he needs to either return for reevaluation or see his PCP.   Final Clinical Impressions(s) / UC Diagnoses   Final diagnoses:  Foot pain, right     Discharge Instructions      Starting tomorrow morning take the Medrol Dosepak according to the package instructions.  This will decrease inflammation and swelling in your foot.  Keep your right foot elevated is much as possible to aid in decreasing swelling and pain relief.  Continue to take your pain medication as prescribed.  You may supplement your pain medication with Tylenol.  Take according to the package instructions.  If your symptoms do not improve, or they worsen, either return for reevaluation or see your PCP.     ED Prescriptions     Medication Sig Dispense Auth. Provider   methylPREDNISolone (MEDROL DOSEPAK) 4 MG TBPK tablet Take according to the package insert. 1 each Becky Augusta, NP      PDMP not  reviewed this encounter.   Becky Augusta, NP 05/27/23 1422

## 2023-05-27 NOTE — ED Triage Notes (Signed)
Pt c/o right foot pain and swelling x3days  Pt states that he had gout 1 month ago and is unsure if it is gout again.  Pt states that he has a red spot on the top of his foot and is unsure if it is a spider bite.  Pt is taking oxycodone for arthritis and states that his pain is controlled but his foot is still painful when walking.

## 2023-05-27 NOTE — Discharge Instructions (Addendum)
Starting tomorrow morning take the Medrol Dosepak according to the package instructions.  This will decrease inflammation and swelling in your foot.  Keep your right foot elevated is much as possible to aid in decreasing swelling and pain relief.  Continue to take your pain medication as prescribed.  You may supplement your pain medication with Tylenol.  Take according to the package instructions.  If your symptoms do not improve, or they worsen, either return for reevaluation or see your PCP.

## 2023-09-01 ENCOUNTER — Ambulatory Visit (INDEPENDENT_AMBULATORY_CARE_PROVIDER_SITE_OTHER): Payer: PPO

## 2023-09-01 ENCOUNTER — Ambulatory Visit
Admission: EM | Admit: 2023-09-01 | Discharge: 2023-09-01 | Disposition: A | Discharge status: Home / Self Care | Payer: PPO | Attending: Emergency Medicine | Admitting: Emergency Medicine

## 2023-09-01 DIAGNOSIS — M722 Plantar fascial fibromatosis: Secondary | ICD-10-CM

## 2023-09-01 DIAGNOSIS — Z760 Encounter for issue of repeat prescription: Secondary | ICD-10-CM | POA: Diagnosis not present

## 2023-09-01 DIAGNOSIS — M79672 Pain in left foot: Secondary | ICD-10-CM

## 2023-09-01 DIAGNOSIS — I5042 Chronic combined systolic (congestive) and diastolic (congestive) heart failure: Secondary | ICD-10-CM | POA: Diagnosis not present

## 2023-09-01 MED ORDER — FUROSEMIDE 20 MG PO TABS: 40.0000 mg | ORAL_TABLET | Freq: Every day | ORAL | 0 refills | Status: AC

## 2023-09-01 NOTE — ED Triage Notes (Signed)
Pt c/o bilateral feet swelling  and left foot pain.   Pt states that his left foot hurts worse and is tender to touch.  Pt states that he has a history of gout.  Pt is on Lasix and states that he is out of his medication and will not have any for another week due to it being a mail order.

## 2023-09-01 NOTE — ED Provider Notes (Signed)
HPI  SUBJECTIVE:  Clifford Martin is a 82 y.o. male who presents with sharp left plantar foot pain starting over the past 3 to 4 days.  It is present only with weightbearing, palpation.  He reports pain with first heel strike.  No burning pain, erythema, color or temperature changes, trauma to the foot, bruising, change in his physical activity.  Denies ankle pain.  He has had symptoms like this before when he had plantar fasciitis about 15 years ago.  He has been taking oxycodone and Tylenol for this with improvement in his symptoms.  Symptoms worse with heel strike and with weightbearing.  Second, he reports bilateral foot swelling after decreasing his Lasix to 20 mg once a day.  He is prescribed 40 mg once a day.  He states that he did not get his shipment of Lasix last month, and is about to run out of it.  He states that the mail order pharmacy will be able to get him his medication in 5 days.  He is requesting a refill on his Lasix to tide him over.  He denies shortness of breath.  He tolerated the Lasix well over the past month and a half without any adverse effects.  Patient has a past medical history of diabetes, COPD, NSTEMI, coronary artery disease, status post 6 stents and CABG x 3 on Plavix and aspirin, CHF with a EF of 45%, hyperlipidemia, hypertension, anemia, sepsis, asthma, on chronic opiates, gout in his right foot/MTP joint, plantar fasciitis.  No history of chronic kidney disease, peripheral neuropathy, osteoporosis.  He was prescribed Lasix 40 mg daily by cardiology on 11/6.  PCP: [facility]  Past Medical History:  Diagnosis Date   Anemia    Arthritis    CHF (congestive heart failure) (HCC)    COPD (chronic obstructive pulmonary disease) (HCC)    Coronary artery disease 11/14/2005   a.) NSTEMI 11/14/05 -> LHC 11/15/05: 100% RCA, 100% OM2, 75% RI, 75-95% LAD; consult CVTS. b.) 3v CABG 11/18/2005. c.) NSTEMI 09 -> LHC 90% RI -> 2.5x12 mm Microdriver BMS. d.) LHC 02/01/14:  70% pLAD, 80-90% mLAD -> 2.25x12 mm (dLAD) and 2.75x20 mm (pLAD) Promus DES. e.) LHC 12/31/15: 90% pRCA, CTO mRCA ->3.5x16 mm pRCA and 2.5x32 mm Promus  DES. f.) LHC 08/31/18: 90% RPLB ->2.5x30 mm Res Onxy DES   Dyspnea    GERD (gastroesophageal reflux disease)    History of atrial flutter 11/19/2005   a.) single episode following extubation from CABG --> DCCV x single 200J shock restored NSR.   History of heart artery stent 2009   6 TOTAL --> a.) 2.5 x 12 Microdriver BMS to RI (2009), b.) overlapping 2.25 x 12 mm (dLAD) and 2.75 x 20 mm (pLAD) Promus Premier DES (02/01/2014). c.) 3.5 x 16 mm (PRCA) and 2.5 x 32 mm(mRCA) Promus Premier (12/31/2015). d.) 2.5 x 30 mm Resolute Onxy to RPLB (08/31/2018)   Hyperlipidemia    Hypertension    Incomplete right bundle branch block (RBBB)    Ischemic cardiomyopathy    Long term current use of antithrombotics/antiplatelets    a.) DAPT therapy (ASA + clopidogrel)   NSTEMI (non-ST elevated myocardial infarction) (HCC) 11/14/2005   a.) LHC 11/15/2005: 100% RCA, 100% OM2, 75% RI, 75-95% LAD; CVTS consulted. b.) 3v CABG 11/19/2015: LIMA-LAD, SVG-OM2, SVG-RCA; severe pericarditis noted intraoperatively. LAD & OM intramyocardial --> required high doses of epi and milrinone to come off bypass. Extubated POD1, however developed resp distress and A.flutter --> DCCV x  1 and reintubated. Weaned from ventilator on POD4.   NSTEMI (non-ST elevated myocardial infarction) (HCC) 2009   a.) LHC --> 90% RI --> PCI performed placing a 2.5 x 12 mm Microdriver BMS.   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   a.) LIMA-LAD, SVG-OM2, SVG-RCA   Sepsis (HCC)    Sleep apnea    a.) no nocturnal PAP therapy   T2DM (type 2 diabetes mellitus) (HCC)     Past Surgical History:  Procedure Laterality Date   CARDIAC CATHETERIZATION Left 11/15/2005   Procedure: CARDIAC CATHETERIZATION; Location: [facility]; Surgeon: Smith Robert, MD   COLONOSCOPY  11/30/2013   COLONOSCOPY  05/05/2017   CORONARY ANGIOPLASTY WITH  STENT PLACEMENT Left 2009   Procedure: CORONARY ANGIOPLASTY WITH STENT PLACEMENT (2.5 x 12 mm Microdriver BMS to RI)   CORONARY ANGIOPLASTY WITH STENT PLACEMENT Left 02/01/2014   Procedure: CORONARY ANGIOPLASTY WITH STENT PLACEMENT (overlapping 2.25 x 12 mm dLAD and 2.75 x 20 mm pLAD Promus Premier DES): Location: [facility]; Surgeon: Orpha Bur, MD   CORONARY ANGIOPLASTY WITH STENT PLACEMENT Left 12/31/2015   Procedure: STAGED CORONARY ANGIOPLASTY WITH STENT PLACEMENT (3.5 x 16 mm pRCA and 2.5 x 32 mm mRCA Promus Premier DES); Location: [facility]; Surgeon: Orpha Bur, MD   CORONARY ANGIOPLASTY WITH STENT PLACEMENT  08/31/2018   Procedure: CORONARY ANGIOPLASTY WITH STENT PLACEMENT (2.5 x 30 mm Resolute Onyx DES to RPLB); Locaiton: [facility]; Surgeon: Willene Hatchet, MD   CORONARY ARTERY BYPASS GRAFT  11/18/2004   Procedure: 3v CORONARY ARTERY BYPASS GRAFT; Location: [facility]; Surgeon: Georgia Dom, MD)   ERCP  10/06/2021   HERNIA REPAIR Bilateral    LEFT HEART CATH AND CORS/GRAFTS ANGIOGRAPHY Left 12/23/2015   Procedure: LEFT HEART CATH AND CORS/GRAFTS ANGIOGRAPHY; Location: [facility]; Surgeon: Orpha Bur, MD   SPINE SURGERY  05/19/2023   UMBILICAL HERNIA REPAIR N/A 10/30/2021   Procedure: HERNIA REPAIR UMBILICAL ADULT;  Surgeon: Leafy Ro, MD;  Location: [facility];  Service: General;  Laterality: N/A;    History reviewed. No pertinent family history.  Social History   Tobacco Use   Smoking status: Former    Current packs/day: 0.00    Average packs/day: 2.0 packs/day for 35.0 years (70.0 ttl pk-yrs)    Types: Cigarettes    Start date: 65    Quit date: 2002    Years since quitting: 23.0    Passive exposure: Never   Smokeless tobacco: Never  Vaping Use   Vaping status: Never Used  Substance Use Topics   Alcohol use: Not Currently   Drug use: Never    No current facility-administered medications for this encounter.  Current Outpatient Medications:    albuterol (VENTOLIN HFA) 108 (90 Base) MCG/ACT  inhaler, Inhale 2 puffs into the lungs every 6 (six) hours as needed for shortness of breath., Disp: , Rfl:    aspirin 81 MG EC tablet, Take 81 mg by mouth daily. Swallow whole., Disp: , Rfl:    atorvastatin (LIPITOR) 40 MG tablet, Take 40 mg by mouth at bedtime., Disp: , Rfl:    famotidine (PEPCID) 20 MG tablet, Take 20 mg by mouth as needed for heartburn or indigestion., Disp: , Rfl:    furosemide (LASIX) 20 MG tablet, Take 2 tablets (40 mg total) by mouth daily for 6 days., Disp: 12 tablet, Rfl: 0   gabapentin (NEURONTIN) 300 MG capsule, Take 300-600 mg by mouth See admin instructions. Take 300 mg morning and 600 mg at night, Disp: , Rfl:    ibuprofen (ADVIL) 200 MG  tablet, Take 400 mg by mouth every 6 (six) hours as needed for headache or moderate pain., Disp: , Rfl:    lisinopril (ZESTRIL) 5 MG tablet, Take 5 mg by mouth 2 (two) times daily., Disp: , Rfl:    metFORMIN (GLUCOPHAGE) 1000 MG tablet, Take 1,000 mg by mouth at bedtime., Disp: , Rfl:    methocarbamol (ROBAXIN) 500 MG tablet, Take 500 mg by mouth every 8 (eight) hours as needed for muscle spasms., Disp: , Rfl:    methylPREDNISolone (MEDROL DOSEPAK) 4 MG TBPK tablet, Take according to the package insert., Disp: 1 each, Rfl: 0   metoprolol tartrate (LOPRESSOR) 50 MG tablet, Take 50 mg by mouth 2 (two) times daily., Disp: , Rfl:    morphine (MS CONTIN) 30 MG 12 hr tablet, Take 30 mg by mouth every 12 (twelve) hours., Disp: , Rfl:    Multiple Vitamin (MULTIVITAMIN WITH MINERALS) TABS tablet, Take 1 tablet by mouth daily., Disp: , Rfl:    naloxone (NARCAN) nasal spray 4 mg/0.1 mL, Place 1 spray into the nose as needed. One spray in either nostril once for known/suspected overdose. May repeat every 2 to 3 minutes in alternating nostrils until EMS arrives., Disp: , Rfl:    traZODone (DESYREL) 50 MG tablet, Take 100-150 mg by mouth at bedtime., Disp: , Rfl:   Allergies  Allergen Reactions   Ativan [Lorazepam] Other (See Comments)    Causes  AMS   Nitroglycerin Other (See Comments)    Blood pressure too low   Zolpidem     Weird behavior     ROS  As noted in HPI.   Physical Exam  BP 120/66 (BP Location: Left Arm)   Pulse 79   Temp 98.5 F (36.9 C) (Oral)   Ht 5\' 10"  (1.778 m)   Wt 77.1 kg   SpO2 100%   BMI 24.39 kg/m   Constitutional: Well developed, well nourished, no acute distress Eyes:  EOMI, conjunctiva normal bilaterally HENT: Normocephalic, atraumatic,mucus membranes moist Respiratory: Normal inspiratory effort Cardiovascular: Normal rate GI: nondistended skin: No rash, skin intact Musculoskeletal:  Mild swelling along the dorsum both feet. Left midfoot NT. Base of fifth metatarsal tender.  Calcaneus NT. no bruising. Skin intact. DP 2+. Refill less than 2 seconds. Sensation grossly intact. Patient able to move all toes actively.  no pain with inversion / eversion,  dorsiflexion / plantarflexion. Tenderness along the plantar fascia. Distal fibula NT, Medial malleolus NT,  Deltoid ligament NT, Lateral ligaments NT, Achilles NT. Patient able to bear weight while in department.  Neurologic: Alert & oriented x 3, no focal neuro deficits Psychiatric: Speech and behavior appropriate   ED Course   Medications - No data to display  Orders Placed This Encounter  Procedures   DG Foot Complete Left    Standing Status:   Standing    Number of Occurrences:   1    Reason for Exam (SYMPTOM  OR DIAGNOSIS REQUIRED):   Plantar pain, tenderness at left fifth metatarsal.  Rule out acute fracture   Ambulatory referral to Podiatry    Referral Priority:   Routine    Referral Type:   Consultation    Referral Reason:   Specialty Services Required    Requested Specialty:   Podiatry    Number of Visits Requested:   1    No results found for this or any previous visit (from the past 24 hours). DG Foot Complete Left Result Date: 09/01/2023 CLINICAL DATA:  Plantar pain. Tenderness  along the fifth metatarsal. EXAM: LEFT  FOOT - COMPLETE 3+ VIEW COMPARISON:  None Available. FINDINGS: Small focal irregularity, possibly a small erosion, along the lateral head of the fifth metatarsal. I do not observe this continuing on within the head of the fifth metatarsal to indicate a true fracture. Well corticated small ossific structures near the base of the fifth metatarsal likely represent chronic osteophytes. Plantar and Achilles calcaneal spurs. No malalignment at the Lisfranc joint is identified. Vascular calcifications noted. Mild dorsal subcutaneous edema along the foot. Mild degenerative findings at the first MTP joint. IMPRESSION: 1. Small focal irregularity, possibly a small erosion, along the lateral head of the fifth metatarsal. I do not see this continuing on within the head of the fifth metatarsal to indicate a true fracture. 2. Mild dorsal subcutaneous edema along the foot. 3. Plantar and Achilles calcaneal spurs. 4. Mild degenerative findings at the first MTP joint. 5. Vascular calcifications. Electronically Signed   By: Gaylyn Rong M.D.   On: 09/01/2023 18:19    ED Clinical Impression  1. Plantar fasciitis of left foot   2. Foot pain, left   3. Chronic combined systolic and diastolic congestive heart failure (HCC)   4. Medication refill      ED Assessment/Plan     Outside medical records reviewed.  Additional medical history obtained.  1.  Left foot pain.  Suspect plantar fasciitis.  Will x-ray left foot to rule out any acute changes as he does have tenderness at the base of the fifth metatarsal.  Will contact patient [phone] if radiology overread differs enough in the from mine and we need to change management.  Reviewed imaging independently.  No acute fracture as read by me.  Chronic looking calcifications at the base of the fifth metatarsal.  Formal radiology overread pending.  Reviewed radiology report.  Negative for acute fracture, consistent with my read.  Dorsal subcutaneous edema along  the foot.  Plantar and Achilles calcaneal spurs.  See radiology report for details.   Presentation consistent with plantar fasciitis.  Heel cups, gentle stretching, follow-up with podiatry.  Placed referral.  Continue current pain medications.  2.  Chronic combined systolic and diastolic congestive heart failure/medication refill.  Patient was prescribed Lasix 40 mg daily by cardiology on 11/6 and it has been controlling his symptoms well.  He has mild bilateral foot swelling, he has been taking 20 mg instead of 40 mg daily but denies shortness of breath.    Last calculated creatinine clearance on labs done in September 38 mL/min.  No dosage adjustment necessary per up-to-date.  Will refill his Lasix, but he must follow-up with cardiology or his PCP for further refills.  Discussed imaging, MDM, treatment plan, and plan for follow-up with patient. Discussed sn/sx that should prompt return to the ED. patient agrees with plan.   Meds ordered this encounter  Medications   furosemide (LASIX) 20 MG tablet    Sig: Take 2 tablets (40 mg total) by mouth daily for 6 days.    Dispense:  12 tablet    Refill:  0      *This clinic note was created using Scientist, clinical (histocompatibility and immunogenetics). Therefore, there may be occasional mistakes despite careful proofreading.  ?    Domenick Gong, MD 09/02/23 1737

## 2023-09-01 NOTE — Discharge Instructions (Addendum)
I did not appreciate any acute fractures on your x-ray.  We will call you if the radiology overread differs enough from mine and we need to change management.  I suspect that you have planta fasciitis.  Heel cups, gentle stretching.  You may take Tylenol 1000 mg 3 times a day as needed for pain.  Please follow-up with podiatry.  I have put in a referral.  I have also refilled your Lasix until you can get more on Tuesday.  Please follow-up with your cardiologist for any further refills.

## 2023-10-16 IMAGING — CT CT HEAD W/O CM
4 series · 17 of 47 positions shown, 19 images · non-contrast
Comparison: Head CT dated 08/28/2021.

CLINICAL DATA: Head trauma.

EXAM:
CT HEAD WITHOUT CONTRAST
TECHNIQUE: Contiguous axial images were obtained from the base of the skull
through the vertex without intravenous contrast.

[Series 2: head wo · axial · 0.42mm/px · z∈[-62,+73]mm · 7 of 37 slices shown, 9 images]
[im 5/37  brain]
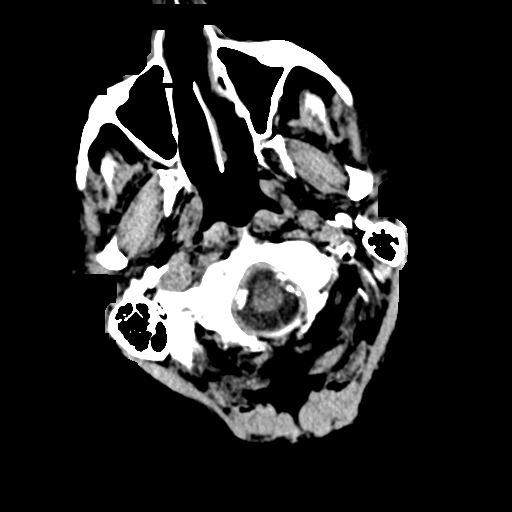
[im 5/37  bone]
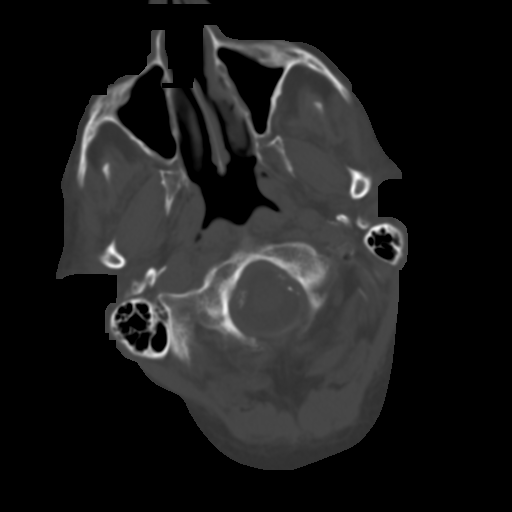
[im 10/37  brain]
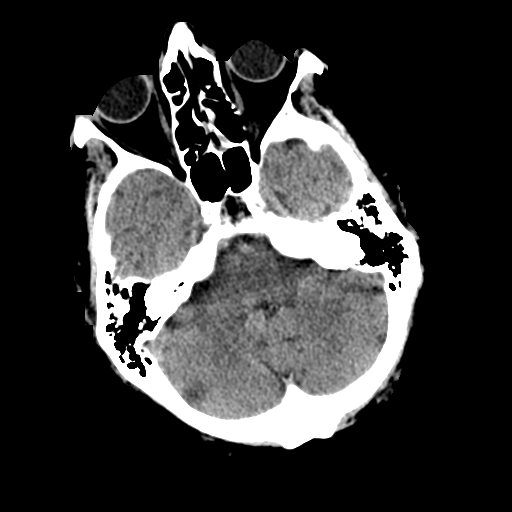
[im 14/37  brain]
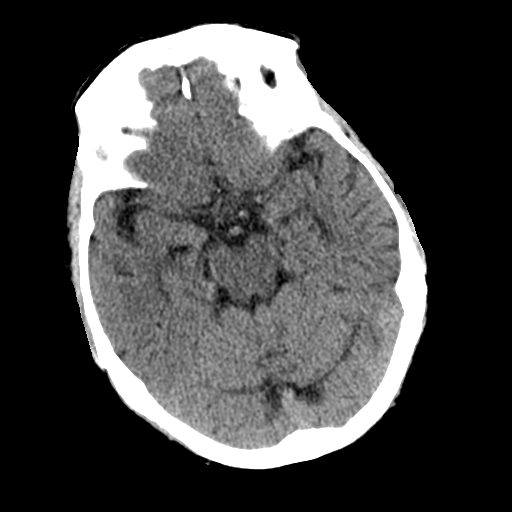
[im 19/37  brain]
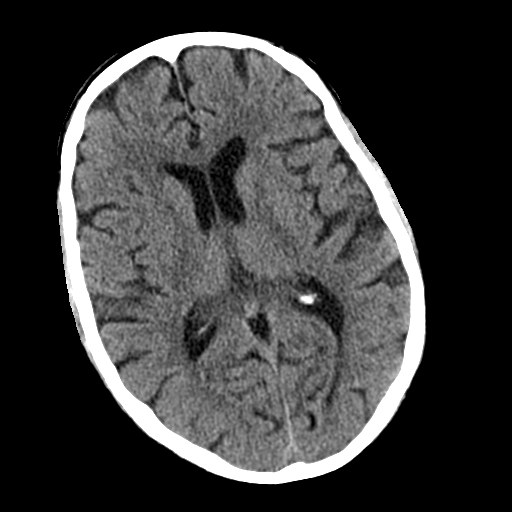
[im 23/37  brain]
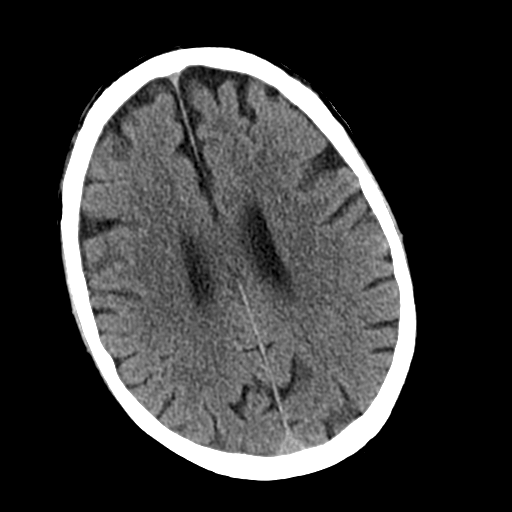
[im 23/37  bone]
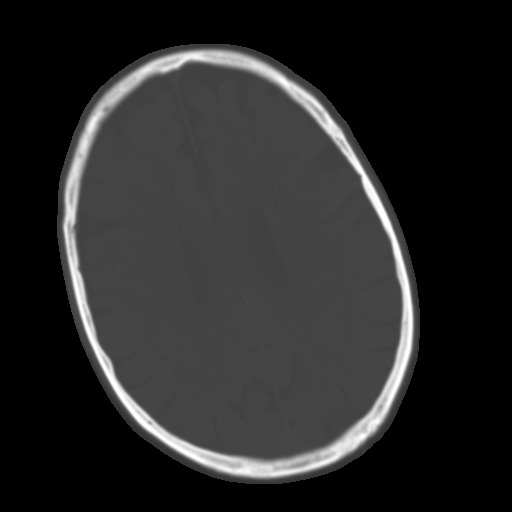
[im 28/37  brain]
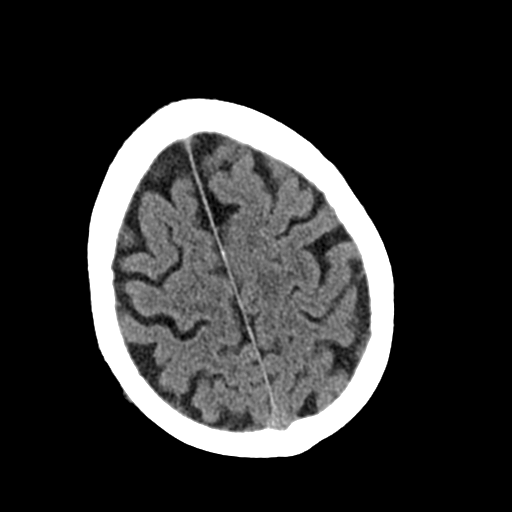
[im 32/37  brain]
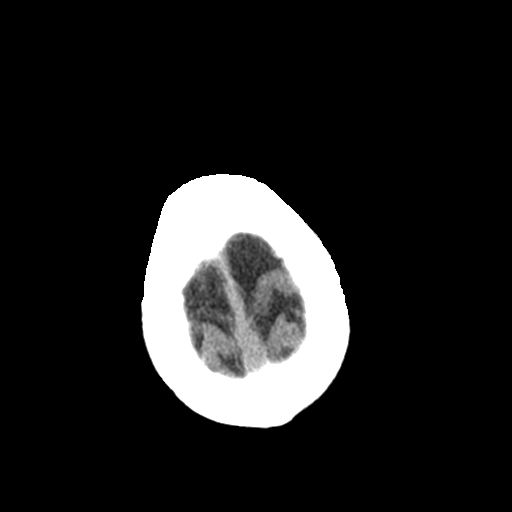

[Series 3: head bone · axial · 0.42mm/px · z∈[-64,-2]mm · 4 of 92 slices shown]
[im 10/92  bone]
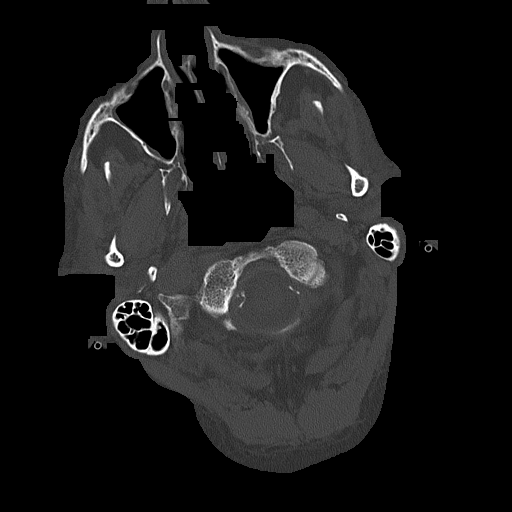
[im 19/92  bone]
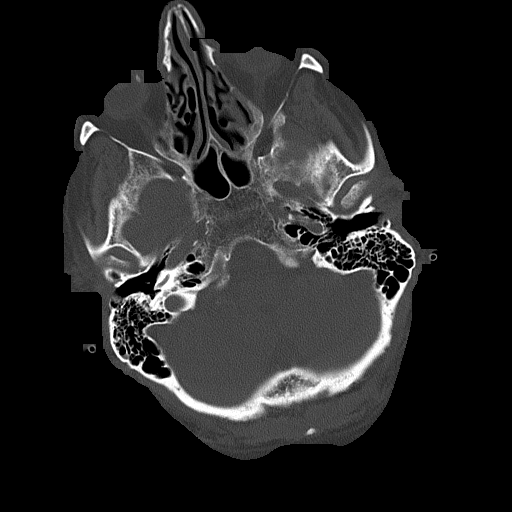
[im 28/92  bone]
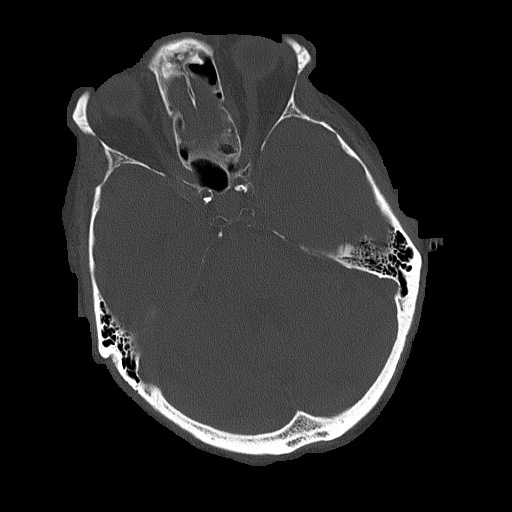
[im 41/92  bone]
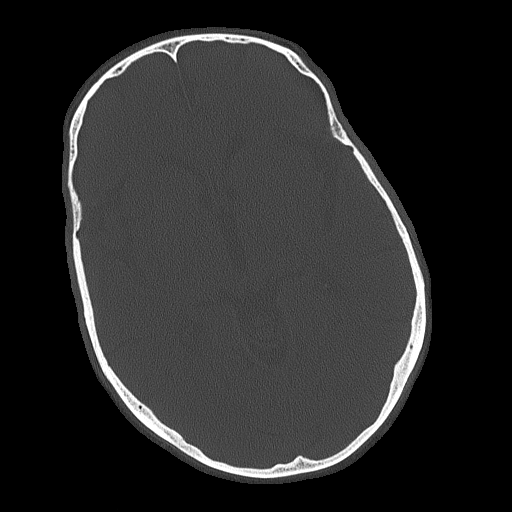

[Series 4: coronal soft tissue · coronal · 0.35mm/px · 3 of 80 slices shown]
[im 27/80  brain]
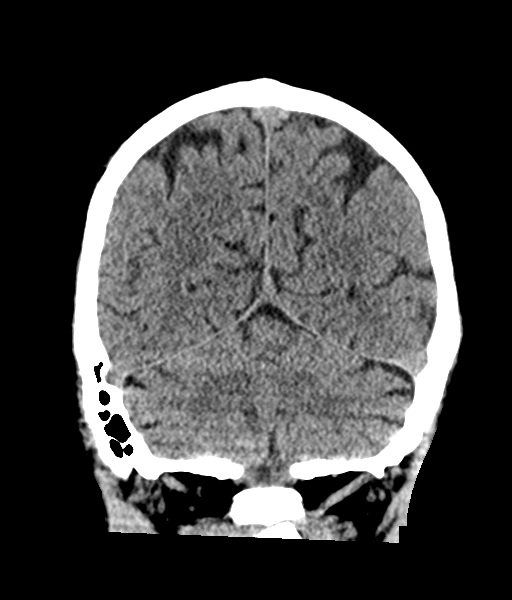
[im 36/80  brain]
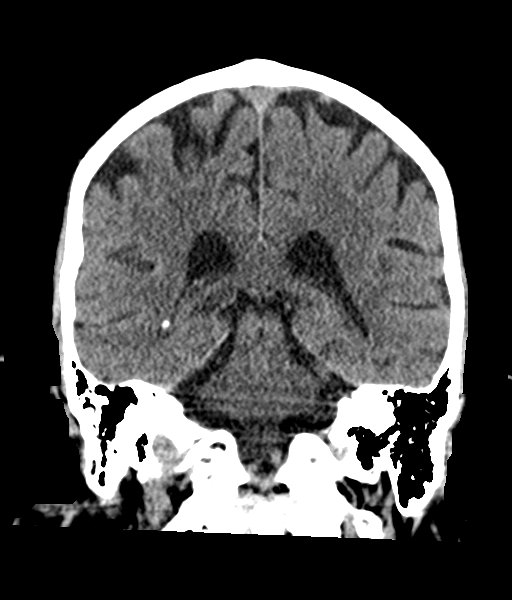
[im 44/80  brain]
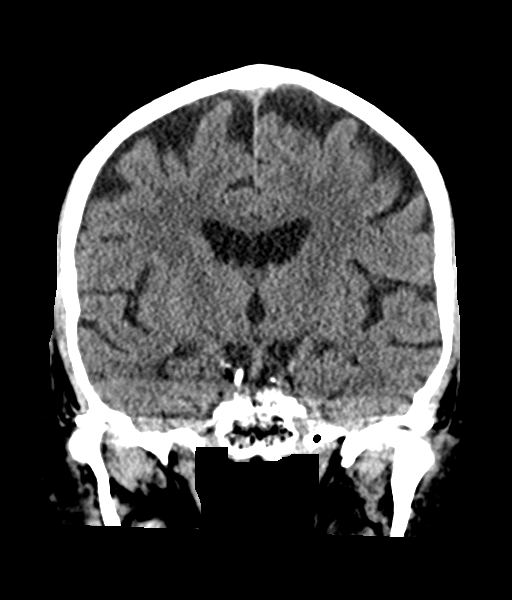

[Series 5: sagittal soft tissue · sagittal · 0.42mm/px · 3 of 62 slices shown]
[im 21/62  brain]
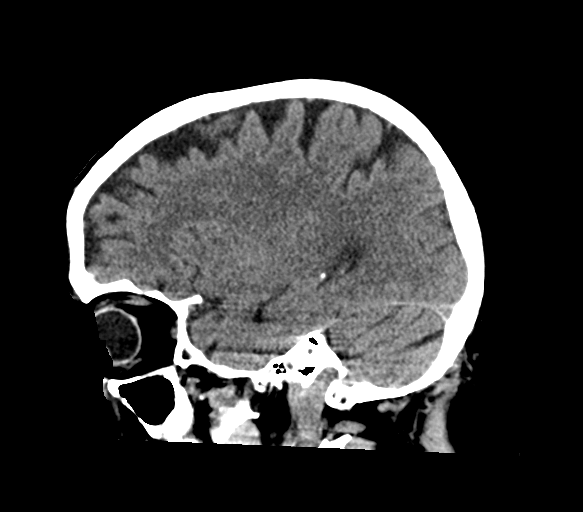
[im 31/62  brain]
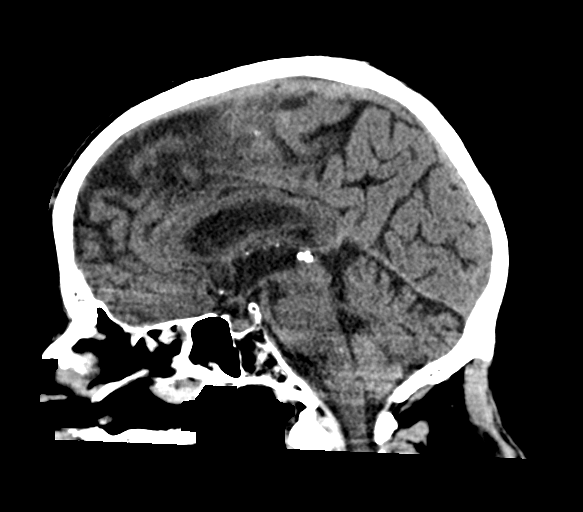
[im 41/62  brain]
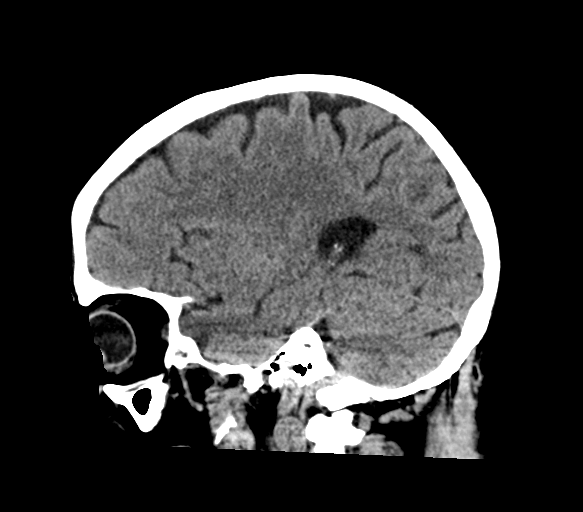

[17 of 47 positions shown; findings below may reference images not displayed]

FINDINGS: Brain: Mild age-related atrophy and chronic microvascular ischemic
changes. There is no acute intracranial hemorrhage. No mass effect
or midline shift. No extra-axial fluid collection.

Vascular: No hyperdense vessel or unexpected calcification.

Skull: Normal. Negative for fracture or focal lesion.

Sinuses/Orbits: Mild mucoperiosteal thickening of paranasal sinuses.
No air-fluid level. Mastoid air cells are clear.

Other: None
IMPRESSION: 1. No acute intracranial pathology.
2. Mild age-related atrophy and chronic microvascular ischemic
changes.

## 2024-09-17 ENCOUNTER — Inpatient Hospital Stay
Admission: EM | Admit: 2024-09-17 | Discharge: 2024-09-22 | DRG: 871 | Disposition: A | Discharge status: Home / Self Care

## 2024-09-17 ENCOUNTER — Emergency Department

## 2024-09-17 ENCOUNTER — Other Ambulatory Visit: Payer: Self-pay

## 2024-09-17 ENCOUNTER — Encounter: Payer: Self-pay | Admitting: Emergency Medicine

## 2024-09-17 DIAGNOSIS — I429 Cardiomyopathy, unspecified: Secondary | ICD-10-CM

## 2024-09-17 DIAGNOSIS — Z888 Allergy status to other drugs, medicaments and biological substances status: Secondary | ICD-10-CM

## 2024-09-17 DIAGNOSIS — A419 Sepsis, unspecified organism: Secondary | ICD-10-CM | POA: Diagnosis present

## 2024-09-17 DIAGNOSIS — Z7984 Long term (current) use of oral hypoglycemic drugs: Secondary | ICD-10-CM | POA: Diagnosis not present

## 2024-09-17 DIAGNOSIS — Z7901 Long term (current) use of anticoagulants: Secondary | ICD-10-CM | POA: Diagnosis not present

## 2024-09-17 DIAGNOSIS — K219 Gastro-esophageal reflux disease without esophagitis: Secondary | ICD-10-CM | POA: Diagnosis present

## 2024-09-17 DIAGNOSIS — I5022 Chronic systolic (congestive) heart failure: Secondary | ICD-10-CM | POA: Diagnosis present

## 2024-09-17 DIAGNOSIS — I483 Typical atrial flutter: Secondary | ICD-10-CM | POA: Diagnosis present

## 2024-09-17 DIAGNOSIS — G9341 Metabolic encephalopathy: Secondary | ICD-10-CM | POA: Diagnosis present

## 2024-09-17 DIAGNOSIS — I110 Hypertensive heart disease with heart failure: Secondary | ICD-10-CM | POA: Diagnosis present

## 2024-09-17 DIAGNOSIS — R6520 Severe sepsis without septic shock: Secondary | ICD-10-CM | POA: Diagnosis present

## 2024-09-17 DIAGNOSIS — Z1152 Encounter for screening for COVID-19: Secondary | ICD-10-CM

## 2024-09-17 DIAGNOSIS — I2510 Atherosclerotic heart disease of native coronary artery without angina pectoris: Secondary | ICD-10-CM | POA: Diagnosis present

## 2024-09-17 DIAGNOSIS — J440 Chronic obstructive pulmonary disease with acute lower respiratory infection: Secondary | ICD-10-CM | POA: Diagnosis present

## 2024-09-17 DIAGNOSIS — E8779 Other fluid overload: Secondary | ICD-10-CM | POA: Diagnosis not present

## 2024-09-17 DIAGNOSIS — E1165 Type 2 diabetes mellitus with hyperglycemia: Secondary | ICD-10-CM | POA: Diagnosis present

## 2024-09-17 DIAGNOSIS — I255 Ischemic cardiomyopathy: Secondary | ICD-10-CM | POA: Diagnosis present

## 2024-09-17 DIAGNOSIS — I10 Essential (primary) hypertension: Secondary | ICD-10-CM | POA: Diagnosis present

## 2024-09-17 DIAGNOSIS — G8929 Other chronic pain: Secondary | ICD-10-CM | POA: Diagnosis present

## 2024-09-17 DIAGNOSIS — Z7982 Long term (current) use of aspirin: Secondary | ICD-10-CM

## 2024-09-17 DIAGNOSIS — Z87891 Personal history of nicotine dependence: Secondary | ICD-10-CM

## 2024-09-17 DIAGNOSIS — R0902 Hypoxemia: Secondary | ICD-10-CM | POA: Diagnosis not present

## 2024-09-17 DIAGNOSIS — J9601 Acute respiratory failure with hypoxia: Secondary | ICD-10-CM | POA: Diagnosis present

## 2024-09-17 DIAGNOSIS — I480 Paroxysmal atrial fibrillation: Secondary | ICD-10-CM | POA: Diagnosis present

## 2024-09-17 DIAGNOSIS — Z951 Presence of aortocoronary bypass graft: Secondary | ICD-10-CM

## 2024-09-17 DIAGNOSIS — I4891 Unspecified atrial fibrillation: Secondary | ICD-10-CM | POA: Diagnosis not present

## 2024-09-17 DIAGNOSIS — Z7902 Long term (current) use of antithrombotics/antiplatelets: Secondary | ICD-10-CM

## 2024-09-17 DIAGNOSIS — Z955 Presence of coronary angioplasty implant and graft: Secondary | ICD-10-CM

## 2024-09-17 DIAGNOSIS — E785 Hyperlipidemia, unspecified: Secondary | ICD-10-CM | POA: Diagnosis present

## 2024-09-17 DIAGNOSIS — I252 Old myocardial infarction: Secondary | ICD-10-CM

## 2024-09-17 DIAGNOSIS — I5042 Chronic combined systolic (congestive) and diastolic (congestive) heart failure: Secondary | ICD-10-CM | POA: Diagnosis present

## 2024-09-17 DIAGNOSIS — Z79899 Other long term (current) drug therapy: Secondary | ICD-10-CM | POA: Diagnosis not present

## 2024-09-17 DIAGNOSIS — R4182 Altered mental status, unspecified: Principal | ICD-10-CM

## 2024-09-17 DIAGNOSIS — G4733 Obstructive sleep apnea (adult) (pediatric): Secondary | ICD-10-CM | POA: Diagnosis present

## 2024-09-17 DIAGNOSIS — J189 Pneumonia, unspecified organism: Secondary | ICD-10-CM | POA: Diagnosis present

## 2024-09-17 DIAGNOSIS — I5020 Unspecified systolic (congestive) heart failure: Secondary | ICD-10-CM | POA: Diagnosis not present

## 2024-09-17 DIAGNOSIS — E119 Type 2 diabetes mellitus without complications: Secondary | ICD-10-CM

## 2024-09-17 DIAGNOSIS — I4819 Other persistent atrial fibrillation: Secondary | ICD-10-CM | POA: Diagnosis not present

## 2024-09-17 DIAGNOSIS — I4892 Unspecified atrial flutter: Secondary | ICD-10-CM | POA: Diagnosis not present

## 2024-09-17 LAB — COMPREHENSIVE METABOLIC PANEL WITH GFR
ALT: 14 U/L (ref 0–44)
AST: 27 U/L (ref 15–41)
Albumin: 4.6 g/dL (ref 3.5–5.0)
Alkaline Phosphatase: 79 U/L (ref 38–126)
Anion gap: 11 (ref 5–15)
BUN: 13 mg/dL (ref 8–23)
CO2: 29 mmol/L (ref 22–32)
Calcium: 9.2 mg/dL (ref 8.9–10.3)
Chloride: 101 mmol/L (ref 98–111)
Creatinine, Ser: 1.35 mg/dL — ABNORMAL HIGH (ref 0.61–1.24)
GFR, Estimated: 53 mL/min — ABNORMAL LOW
Glucose, Bld: 169 mg/dL — ABNORMAL HIGH (ref 70–99)
Potassium: 4.1 mmol/L (ref 3.5–5.1)
Sodium: 140 mmol/L (ref 135–145)
Total Bilirubin: 1.1 mg/dL (ref 0.0–1.2)
Total Protein: 7.3 g/dL (ref 6.5–8.1)

## 2024-09-17 LAB — CBC WITH DIFFERENTIAL/PLATELET
Abs Immature Granulocytes: 0.1 K/uL — ABNORMAL HIGH (ref 0.00–0.07)
Basophils Absolute: 0 K/uL (ref 0.0–0.1)
Basophils Relative: 0 %
Eosinophils Absolute: 0 K/uL (ref 0.0–0.5)
Eosinophils Relative: 0 %
HCT: 42 % (ref 39.0–52.0)
Hemoglobin: 14.1 g/dL (ref 13.0–17.0)
Immature Granulocytes: 1 %
Lymphocytes Relative: 4 %
Lymphs Abs: 0.6 K/uL — ABNORMAL LOW (ref 0.7–4.0)
MCH: 30.5 pg (ref 26.0–34.0)
MCHC: 33.6 g/dL (ref 30.0–36.0)
MCV: 90.7 fL (ref 80.0–100.0)
Monocytes Absolute: 0.9 K/uL (ref 0.1–1.0)
Monocytes Relative: 6 %
Neutro Abs: 13.4 K/uL — ABNORMAL HIGH (ref 1.7–7.7)
Neutrophils Relative %: 89 %
Platelets: 169 K/uL (ref 150–400)
RBC: 4.63 MIL/uL (ref 4.22–5.81)
RDW: 12.9 % (ref 11.5–15.5)
WBC: 15 K/uL — ABNORMAL HIGH (ref 4.0–10.5)
nRBC: 0 % (ref 0.0–0.2)

## 2024-09-17 LAB — RESP PANEL BY RT-PCR (RSV, FLU A&B, COVID)  RVPGX2
Influenza A by PCR: NEGATIVE
Influenza B by PCR: NEGATIVE
Resp Syncytial Virus by PCR: NEGATIVE
SARS Coronavirus 2 by RT PCR: NEGATIVE

## 2024-09-17 LAB — CBG MONITORING, ED: Glucose-Capillary: 112 mg/dL — ABNORMAL HIGH (ref 70–99)

## 2024-09-17 LAB — PROTIME-INR
INR: 1.1 (ref 0.8–1.2)
Prothrombin Time: 14.3 s (ref 11.4–15.2)

## 2024-09-17 LAB — LACTIC ACID, PLASMA: Lactic Acid, Venous: 1.4 mmol/L (ref 0.5–1.9)

## 2024-09-17 MED ORDER — HEPARIN SODIUM (PORCINE) 5000 UNIT/ML IJ SOLN
5000.0000 [IU] | Freq: Three times a day (TID) | INTRAMUSCULAR | Status: DC
  Administered 2024-09-17 – 2024-09-20 (×8): 5000 [IU] via SUBCUTANEOUS
  Filled 2024-09-17 (×8): qty 1

## 2024-09-17 MED ORDER — SODIUM CHLORIDE 0.9 % IV SOLN
2.0000 g | Freq: Once | INTRAVENOUS | Status: AC
  Administered 2024-09-17: 2 g via INTRAVENOUS
  Filled 2024-09-17: qty 12.5

## 2024-09-17 MED ORDER — ACETAMINOPHEN 650 MG RE SUPP: 650.0000 mg | Freq: Four times a day (QID) | RECTAL | Status: DC | PRN

## 2024-09-17 MED ORDER — LACTATED RINGERS IV BOLUS (SEPSIS)
500.0000 mL | Freq: Once | INTRAVENOUS | Status: AC
  Administered 2024-09-17: 500 mL via INTRAVENOUS

## 2024-09-17 MED ORDER — ONDANSETRON HCL 4 MG PO TABS: 4.0000 mg | ORAL_TABLET | Freq: Four times a day (QID) | ORAL | Status: DC | PRN

## 2024-09-17 MED ORDER — SODIUM CHLORIDE 0.9 % IV BOLUS
500.0000 mL | Freq: Once | INTRAVENOUS | Status: AC
  Administered 2024-09-17: 500 mL via INTRAVENOUS

## 2024-09-17 MED ORDER — ACETAMINOPHEN 325 MG PO TABS: 650.0000 mg | ORAL_TABLET | Freq: Four times a day (QID) | ORAL | Status: DC | PRN

## 2024-09-17 MED ORDER — LACTATED RINGERS IV SOLN: INTRAVENOUS | Status: AC

## 2024-09-17 MED ORDER — SODIUM CHLORIDE 0.9 % IV SOLN
100.0000 mg | Freq: Two times a day (BID) | INTRAVENOUS | Status: DC
  Administered 2024-09-17 – 2024-09-20 (×6): 100 mg via INTRAVENOUS
  Filled 2024-09-17 (×9): qty 100

## 2024-09-17 MED ORDER — INSULIN ASPART 100 UNIT/ML IJ SOLN
0.0000 [IU] | Freq: Three times a day (TID) | INTRAMUSCULAR | Status: DC
  Administered 2024-09-19: 1 [IU] via SUBCUTANEOUS
  Administered 2024-09-19: 2 [IU] via SUBCUTANEOUS
  Administered 2024-09-20 (×2): 1 [IU] via SUBCUTANEOUS
  Administered 2024-09-20: 2 [IU] via SUBCUTANEOUS
  Filled 2024-09-17 (×2): qty 1
  Filled 2024-09-17: qty 2
  Filled 2024-09-17: qty 1
  Filled 2024-09-17: qty 2

## 2024-09-17 MED ORDER — INSULIN ASPART 100 UNIT/ML IJ SOLN
0.0000 [IU] | Freq: Every day | INTRAMUSCULAR | Status: DC
  Filled 2024-09-17: qty 2

## 2024-09-17 MED ORDER — ACETAMINOPHEN 325 MG PO TABS
650.0000 mg | ORAL_TABLET | Freq: Once | ORAL | Status: AC
  Administered 2024-09-17: 650 mg via ORAL
  Filled 2024-09-17: qty 2

## 2024-09-17 MED ORDER — METRONIDAZOLE 500 MG/100ML IV SOLN
500.0000 mg | Freq: Once | INTRAVENOUS | Status: AC
  Administered 2024-09-17: 500 mg via INTRAVENOUS
  Filled 2024-09-17: qty 100

## 2024-09-17 MED ORDER — SODIUM CHLORIDE 0.9 % IV SOLN
2.0000 g | INTRAVENOUS | Status: AC
  Administered 2024-09-17 – 2024-09-21 (×5): 2 g via INTRAVENOUS
  Filled 2024-09-17 (×6): qty 20

## 2024-09-17 MED ORDER — VANCOMYCIN HCL IN DEXTROSE 1-5 GM/200ML-% IV SOLN
1000.0000 mg | Freq: Once | INTRAVENOUS | Status: AC
  Administered 2024-09-17: 1000 mg via INTRAVENOUS
  Filled 2024-09-17: qty 200

## 2024-09-17 MED ORDER — ONDANSETRON HCL 4 MG/2ML IJ SOLN: 4.0000 mg | Freq: Four times a day (QID) | INTRAMUSCULAR | Status: DC | PRN

## 2024-09-17 NOTE — Progress Notes (Signed)
 CODE SEPSIS - PHARMACY COMMUNICATION  **Broad Spectrum Antibiotics should be administered within 1 hour of Sepsis diagnosis**  Time Code Sepsis Called/Page Received: 1516  Antibiotics Ordered: cefepime , metronidazole , vanc  Time of 1st antibiotic administration: 1529  Additional action taken by pharmacy: -  If necessary, Name of Provider/Nurse Contacted: -    Lum VEAR Mania ,PharmD Clinical Pharmacist  09/17/2024  3:21 PM

## 2024-09-17 NOTE — ED Triage Notes (Signed)
 Pt from home via [facility] with reports of AMS. Per Ems pts wife left home to do chores and came back about 0930 to find paitnet altered to time, place, and name. Pt disoriented x 4 at this time.

## 2024-09-17 NOTE — Sepsis Progress Note (Signed)
 Elink following code sepsis

## 2024-09-17 NOTE — ED Notes (Signed)
 Called CCMD to add pt to monitoring.

## 2024-09-17 NOTE — ED Notes (Signed)
 Hospitalist at bedside

## 2024-09-17 NOTE — H&P (Signed)
 " History and Physical    Clifford Martin FMW:[id number] DOB: 1940-11-10 DOA: 09/17/2024  DOS: the patient was seen and examined on 09/17/2024  PCP: [facility]   Patient coming from: Home  I have personally briefly reviewed patient's old medical records in [facility] and CareEverywhere  HPI:   Clifford Martin is a 84 y.o. 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  Pt denying any acute conerns. He is able to state his name, location but not able to give information about situation. Wife is at bedside providing history and states patient was normal earlier and when she came back to check on him later this morning he was altered.  She called EMS and when EMS arrived they checked his temperature and it was 102. She denies any coughing or sick contacts. On arrival to the ED patient was noted to be HDS stable.  Lab work and imaging obtained.  CBC with leukocytosis at 15, normal hemoglobin.  CMP with elevated creatinine but not amounting to AKI, moderate hyperglycemia.  Lactic acid normal.  Respiratory panel negative for RSV, flu, COVID.  Chest x-ray with signs of pneumonia.  Patient initially given antibiotics to cover for unknown source.  Given need for continued care, [facility] contacted for admission.  Review of Systems: As mentioned in the history of present illness. All other systems reviewed and are negative.   Past Medical History:  Diagnosis Date   Anemia    Arthritis    CHF (congestive heart failure) (HCC)    COPD (chronic obstructive pulmonary disease) (HCC)    Coronary artery disease 11/14/2005   a.) NSTEMI 11/14/05 -> LHC 11/15/05: 100% RCA, 100% OM2, 75% RI, 75-95% LAD; consult CVTS. b.) 3v CABG 11/18/2005. c.) NSTEMI 09 -> LHC 90% RI -> 2.5x12 mm Microdriver BMS. d.) LHC 02/01/14: 70% pLAD, 80-90% mLAD -> 2.25x12 mm (dLAD)  and 2.75x20 mm (pLAD) Promus DES. e.) LHC 12/31/15: 90% pRCA, CTO mRCA ->3.5x16 mm pRCA and 2.5x32 mm Promus  DES. f.) LHC 08/31/18: 90% RPLB ->2.5x30 mm Res Onxy DES   Dyspnea    GERD (gastroesophageal reflux disease)    History of atrial flutter 11/19/2005   a.) single episode following extubation from CABG --> DCCV x single 200J shock restored NSR.   History of heart artery stent 2009   6 TOTAL --> a.) 2.5 x 12 Microdriver BMS to RI (2009), b.) overlapping 2.25 x 12 mm (dLAD) and 2.75 x 20 mm (pLAD) Promus Premier DES (02/01/2014). c.) 3.5 x 16 mm (PRCA) and 2.5 x 32 mm(mRCA) Promus Premier (12/31/2015). d.) 2.5 x 30 mm Resolute Onxy to RPLB (08/31/2018)   Hyperlipidemia    Hypertension    Incomplete right bundle branch block (RBBB)    Ischemic cardiomyopathy    Long term current use of antithrombotics/antiplatelets    a.) DAPT therapy (ASA + clopidogrel )   NSTEMI (non-ST elevated myocardial infarction) (HCC) 11/14/2005   a.) LHC 11/15/2005: 100% RCA, 100% OM2, 75% RI, 75-95% LAD; CVTS consulted. b.) 3v CABG 11/19/2015: LIMA-LAD, SVG-OM2, SVG-RCA; severe pericarditis noted intraoperatively. LAD & OM intramyocardial --> required high doses of epi and milrinone to come off bypass. Extubated POD1, however developed resp distress and A.flutter --> DCCV x 1 and reintubated. Weaned from ventilator on POD4.   NSTEMI (non-ST elevated myocardial infarction) (HCC) 2009   a.) LHC -->  90% RI --> PCI performed placing a 2.5 x 12 mm Microdriver BMS.   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   a.) LIMA-LAD, SVG-OM2, SVG-RCA   Sepsis (HCC)    Sleep apnea    a.) no nocturnal PAP therapy   T2DM (type 2 diabetes mellitus) (HCC)     Past Surgical History:  Procedure Laterality Date   CARDIAC CATHETERIZATION Left 11/15/2005   Procedure: CARDIAC CATHETERIZATION; Location: [facility]; Surgeon: Melanee, MD   COLONOSCOPY  11/30/2013   COLONOSCOPY  05/05/2017   CORONARY ANGIOPLASTY WITH STENT PLACEMENT Left 2009   Procedure:  CORONARY ANGIOPLASTY WITH STENT PLACEMENT (2.5 x 12 mm Microdriver BMS to RI)   CORONARY ANGIOPLASTY WITH STENT PLACEMENT Left 02/01/2014   Procedure: CORONARY ANGIOPLASTY WITH STENT PLACEMENT (overlapping 2.25 x 12 mm dLAD and 2.75 x 20 mm pLAD Promus Premier DES): Location: [facility]; Surgeon: Suella Finders, MD   CORONARY ANGIOPLASTY WITH STENT PLACEMENT Left 12/31/2015   Procedure: STAGED CORONARY ANGIOPLASTY WITH STENT PLACEMENT (3.5 x 16 mm pRCA and 2.5 x 32 mm mRCA Promus Premier DES); Location: [facility]; Surgeon: Suella Finders, MD   CORONARY ANGIOPLASTY WITH STENT PLACEMENT  08/31/2018   Procedure: CORONARY ANGIOPLASTY WITH STENT PLACEMENT (2.5 x 30 mm Resolute Onyx DES to RPLB); Locaiton: [facility]; Surgeon: Zachary Car, MD   CORONARY ARTERY BYPASS GRAFT  11/18/2004   Procedure: 3v CORONARY ARTERY BYPASS GRAFT; Location: [facility]; Surgeon: Lajoyce Farr, MD)   ERCP  10/06/2021   HERNIA REPAIR Bilateral    LEFT HEART CATH AND CORS/GRAFTS ANGIOGRAPHY Left 12/23/2015   Procedure: LEFT HEART CATH AND CORS/GRAFTS ANGIOGRAPHY; Location: [facility]; Surgeon: Suella Finders, MD   SPINE SURGERY  05/19/2023   UMBILICAL HERNIA REPAIR N/A 10/30/2021   Procedure: HERNIA REPAIR UMBILICAL ADULT;  Surgeon: Jordis Laneta FALCON, MD;  Location: [facility];  Service: General;  Laterality: N/A;     Allergies[1]  History reviewed. No pertinent family history.  Prior to Admission medications  Medication Sig Start Date End Date Taking? Authorizing Provider  clopidogrel  (PLAVIX ) 75 MG tablet Take 75 mg by mouth daily. 10/25/23 11/19/24 Yes [provider]  colchicine  0.6 MG tablet Take 0.6 mg by mouth daily. 11/30/23  Yes [provider]  montelukast (SINGULAIR) 10 MG tablet Take 10 mg by mouth at bedtime. 11/30/23 11/29/24 Yes [provider]  morphine  (MSIR) 15 MG tablet Take 15 mg by mouth every 8 (eight) hours as needed for moderate pain (pain score 4-6). 09/16/23  Yes [provider]  omeprazole (PRILOSEC) 20 MG  capsule Take 20 mg by mouth daily. 11/30/23 11/29/24 Yes [provider]  traZODone  (DESYREL ) 100 MG tablet Take 200 mg by mouth at bedtime. 08/09/24  Yes [provider]  albuterol  (VENTOLIN  HFA) 108 (90 Base) MCG/ACT inhaler Inhale 2 puffs into the lungs every 6 (six) hours as needed for shortness of breath. 10/07/21   [provider]  aspirin  81 MG EC tablet Take 81 mg by mouth daily. Swallow whole.    [provider]  atorvastatin  (LIPITOR) 40 MG tablet Take 40 mg by mouth at bedtime. 07/23/21   [provider]  famotidine  (PEPCID ) 20 MG tablet Take 20 mg by mouth as needed for heartburn or indigestion.    [provider]  furosemide  (LASIX ) 20 MG tablet Take 2 tablets (40 mg total) by mouth daily for 6 days. 09/01/23 09/07/23  Van Knee, MD  gabapentin  (NEURONTIN ) 300 MG capsule Take 300-600 mg by mouth See admin instructions. Take 300 mg morning and  600 mg at night 07/24/21   [provider]  ibuprofen (ADVIL) 200 MG tablet Take 400 mg by mouth every 6 (six) hours as needed for headache or moderate pain.    [provider]  lisinopril  (ZESTRIL ) 5 MG tablet Take 5 mg by mouth 2 (two) times daily. 04/28/21 04/28/22  [provider]  metFORMIN (GLUCOPHAGE) 1000 MG tablet Take 1,000 mg by mouth at bedtime. 04/22/21 04/17/22  [provider]  methocarbamol (ROBAXIN) 500 MG tablet Take 500 mg by mouth every 8 (eight) hours as needed for muscle spasms.    [provider]  methylPREDNISolone  (MEDROL  DOSEPAK) 4 MG TBPK tablet Take according to the package insert. 05/27/23   Bernardino Ditch, NP  metoprolol  tartrate (LOPRESSOR ) 50 MG tablet Take 50 mg by mouth 2 (two) times daily. 12/09/20 12/09/21  [provider]  Multiple Vitamin (MULTIVITAMIN WITH MINERALS) TABS tablet Take 1 tablet by mouth daily.    [provider]  naloxone [facility]) nasal spray 4 mg/0.1 mL Place 1 spray into the nose as needed.  One spray in either nostril once for known/suspected overdose. May repeat every 2 to 3 minutes in alternating nostrils until EMS arrives. 04/22/21   [provider]    Social History:  reports that he quit smoking about 24 years ago. His smoking use included cigarettes. He started smoking about 59 years ago. He has a 70 pack-year smoking history. He has never been exposed to tobacco smoke. He has never used smokeless tobacco. He reports that he does not currently use alcohol. He reports that he does not use drugs.    Physical Exam: Vitals:   09/17/24 1509  BP: 105/73  Pulse: 98  Resp: 18  Temp: (!) 102.4 F (39.1 C)  TempSrc: Oral  SpO2: 91%    Gen: NAD HENT: NCAT CV: tachycardic rate, sinus rhythm Lung: rhonchi present Abd: No TTP, normal bowel sounds MSK: No asymmetry, good bulk and tone Neuro: alert and oriented   Labs on Admission: I have personally reviewed following labs and imaging studies  CBC: Recent Labs  Lab 09/17/24 1517  WBC 15.0*  NEUTROABS 13.4*  HGB 14.1  HCT 42.0  MCV 90.7  PLT 169   Basic Metabolic Panel: Recent Labs  Lab 09/17/24 1517  NA 140  K 4.1  CL 101  CO2 29  GLUCOSE 169*  BUN 13  CREATININE 1.35*  CALCIUM  9.2   GFR: CrCl cannot be calculated (Unknown ideal weight.). Liver Function Tests: Recent Labs  Lab 09/17/24 1517  AST 27  ALT 14  ALKPHOS 79  BILITOT 1.1  PROT 7.3  ALBUMIN  4.6   No results for input(s): LIPASE, AMYLASE in the last 168 hours. No results for input(s): AMMONIA in the last 168 hours. Coagulation Profile: Recent Labs  Lab 09/17/24 1517  INR 1.1   Cardiac Enzymes: No results for input(s): CKTOTAL, CKMB, CKMBINDEX, TROPONINI, TROPONINIHS in the last 168 hours. BNP (last 3 results) No results for input(s): BNP in the last 8760 hours. HbA1C: No results for input(s): HGBA1C in the last 72 hours. CBG: No results for input(s): GLUCAP in the last 168 hours. Lipid  Profile: No results for input(s): CHOL, HDL, LDLCALC, TRIG, CHOLHDL, LDLDIRECT in the last 72 hours. Thyroid  Function Tests: No results for input(s): TSH, T4TOTAL, FREET4, T3FREE, THYROIDAB in the last 72 hours. Anemia Panel: No results for input(s): VITAMINB12, FOLATE, FERRITIN, TIBC, IRON, RETICCTPCT in the last 72 hours. Urine analysis:    Component Value Date/Time  COLORURINE AMBER (A) 08/28/2021 0020   APPEARANCEUR HAZY (A) 08/28/2021 0020   LABSPEC 1.020 08/28/2021 0020   PHURINE 5.5 08/28/2021 0020   GLUCOSEU 100 (A) 08/28/2021 0020   HGBUR NEGATIVE 08/28/2021 0020   BILIRUBINUR LARGE (A) 08/28/2021 0020   KETONESUR 15 (A) 08/28/2021 0020   PROTEINUR 100 (A) 08/28/2021 0020   NITRITE NEGATIVE 08/28/2021 0020   LEUKOCYTESUR NEGATIVE 08/28/2021 0020    Radiological Exams on Admission: I have personally reviewed images DG Chest Port 1 View Result Date: 09/17/2024 CLINICAL DATA:  Sepsis, altered level of consciousness EXAM: PORTABLE CHEST 1 VIEW COMPARISON:  08/03/2022 FINDINGS: Single frontal view of the chest demonstrates stable enlargement of the cardiac silhouette. There is new opacification at the right lung base, consistent with right basilar pneumonia. No effusion or pneumothorax. No acute bony abnormalities. IMPRESSION: 1. Right basilar opacification consistent with pneumonia. Followup PA and lateral chest X-ray is recommended in 3-4 weeks following trial of antibiotic therapy to ensure resolution and exclude underlying malignancy. 2. Stable enlarged cardiac silhouette. Electronically Signed   By: Ozell Daring M.D.   On: 09/17/2024 16:32    EKG: My personal interpretation of EKG shows: Sinus rhythm without any acute ST changes    Assessment/Plan Principal Problem:   Sepsis due to pneumonia [facility]) Active Problems:   Hypertension   Diabetes mellitus without complication (HCC)   Acute metabolic encephalopathy   Chronic combined  systolic and diastolic heart failure (HCC)   Hyperlipidemia    Patient with acute encephalopathy and new onset fevers found to have pneumonia.  Patient meets sepsis criteria.  Image findings consistent with pneumonia. Blood cultures ordered. Pt is status post cefepime , metronidazole  and vancomycin .  Will start patient on CAP coverage with ceftriaxone  and doxycycline  given shortage of azithromycin and unable to take p.o. -Monitor fever curve, trend WBC -Follow up blood cultures  -Wean oxygen as able -Continuous pluse ox  Acute metabolic encephalopathy: Likely in setting of pneumonia as above. Improving per wife and nurse.  Continue treatment as above.  High risk for hospital delirium still place delirium precautions.  Hypertension: Holding home medicines given sepsis and normotensive blood pressures.  Hyperlipidemia: Holding home medicine intermittent reconciliation can be performed.  Type 2 diabetes mellitus: Start SSI.  VTE prophylaxis:  SQ Heparin   Diet: N.p.o. Code Status:  Full Code Telemetry:  Admission status: Inpatient, Progressive Patient is from: Home Anticipated d/c is to: Home Anticipated d/c is in: 2-3 days   Family Communication: Updated at bedside  Consults called: None   Severity of Illness: The appropriate patient status for this patient is INPATIENT. Inpatient status is judged to be reasonable and necessary in order to provide the required intensity of service to ensure the patient's safety. The patient's presenting symptoms, physical exam findings, and initial radiographic and laboratory data in the context of their chronic comorbidities is felt to place them at high risk for further clinical deterioration. Furthermore, it is not anticipated that the patient will be medically stable for discharge from the hospital within 2 midnights of admission.   * I certify that at the point of admission it is my clinical judgment that the patient will require inpatient  hospital care spanning beyond 2 midnights from the point of admission due to high intensity of service, high risk for further deterioration and high frequency of surveillance required.DEWAINE Morene Bathe, MD Jolynn DEL. [facility]      [1]  Allergies Allergen Reactions   Ativan  [Lorazepam ] Other (See  Comments)    Causes AMS   Nitroglycerin Other (See Comments)    Blood pressure too low   Zolpidem     Weird behavior   "

## 2024-09-17 NOTE — Sepsis Progress Note (Signed)
 Elink RN reached out to current [facility] with concern of 2SBP < 90 and still has 500cc bolus needing to be hung, communication via chat which BSRN stated she just hung it

## 2024-09-17 NOTE — ED Provider Notes (Signed)
 "  [facility] Provider Note    Event Date/Time   First MD Initiated Contact with Patient 09/17/24 1502     (approximate)   History   Altered Mental Status  HPI  LORIK GUO is a 84 y.o. male past medical history significant for COPD, hyperlipidemia, CHF, presents to the emergency department with altered mental status.  According to EMS patient was in his normal state of health and is normally alert and oriented x 4, his wife went to the grocery store and came back and he was significantly confused and not acting his normal self.  With EMS he was found to have a temperature of 102.  Patient does states that he does not feel uncomfortable.  Does not provide any further history.  Is not oriented to person place or time.  Denies any abdominal pain, nausea or vomiting.  Denies any cough.  Does not answer questions appropriately when asking about painful urination.  No known recent falls or head trauma.     Physical Exam   Triage Vital Signs: ED Triage Vitals  Encounter Vitals Group     BP      Girls Systolic BP Percentile      Girls Diastolic BP Percentile      Boys Systolic BP Percentile      Boys Diastolic BP Percentile      Pulse      Resp      Temp      Temp src      SpO2      Weight      Height      Head Circumference      Peak Flow      Pain Score      Pain Loc      Pain Education      Exclude from Growth Chart     Most recent vital signs: Vitals:   09/17/24 1509  BP: 105/73  Pulse: 98  Resp: 18  Temp: (!) 102.4 F (39.1 C)  SpO2: 91%    Physical Exam Constitutional:      Appearance: He is well-developed.  HENT:     Head: Atraumatic.     Mouth/Throat:     Mouth: Mucous membranes are moist.  Eyes:     Extraocular Movements: Extraocular movements intact.     Conjunctiva/sclera: Conjunctivae normal.     Pupils: Pupils are equal, round, and reactive to light.  Cardiovascular:     Rate and Rhythm: Regular rhythm. Tachycardia  present.  Pulmonary:     Effort: No respiratory distress.     Breath sounds: Rhonchi (R sided) present. No wheezing.     Comments: Hypoxic to 86%, placed on 2 L nasal cannula with improvement to 91%. Abdominal:     Tenderness: There is no abdominal tenderness.  Musculoskeletal:     Cervical back: Normal range of motion.     Right lower leg: No edema.     Left lower leg: No edema.  Skin:    General: Skin is warm.     Capillary Refill: Capillary refill takes less than 2 seconds.     Findings: No rash.  Neurological:     Mental Status: He is alert. He is disoriented.     Comments: Moving all extremities and following basic commands     IMPRESSION / MDM / ASSESSMENT AND PLAN / ED COURSE  I reviewed the triage vital signs and the nursing notes.  On arrival patient is  disoriented.  Febrile to 102.4, soft blood pressures of 105/73 and found to be hypoxic to 86% and was placed on 2 L nasal cannula with improvement to 91%.  Differential diagnosis including sepsis, pneumonia, viral illness including COVID/influenza, urinary tract infection, intra-abdominal infection.  On chart review I did not see any history of recent antibiotic use or hospitalizations.  Concern that 30 cc/kg of IV fluids may be detrimental to the patient given his history of CHF, will give a 500 bolus and reevaluate.  Started on antibiotics to cover unknown source.  EKG  I, Clotilda Punter, the attending physician, personally viewed and interpreted this ECG.  EKG showed normal sinus rhythm.  Normal intervals.  No obvious chamber enlargement.  Nonspecific ST changes.  No significant change when compared to prior EKG.  No findings of acute ischemia or dysrhythmia.  No tachycardic or bradycardic dysrhythmias while on cardiac telemetry.  RADIOLOGY I independently reviewed imaging, my interpretation of imaging: CXR -my evaluation concern for right lower lobe pneumonia.  LABS (all labs ordered are listed, but only abnormal  results are displayed) Labs interpreted as -    Labs Reviewed  COMPREHENSIVE METABOLIC PANEL WITH GFR - Abnormal; Notable for the following components:      Result Value   Glucose, Bld 169 (*)    Creatinine, Ser 1.35 (*)    GFR, Estimated 53 (*)    All other components within normal limits  CBC WITH DIFFERENTIAL/PLATELET - Abnormal; Notable for the following components:   WBC 15.0 (*)    Neutro Abs 13.4 (*)    Lymphs Abs 0.6 (*)    Abs Immature Granulocytes 0.10 (*)    All other components within normal limits  RESP PANEL BY RT-PCR (RSV, FLU A&B, COVID)  RVPGX2  CULTURE, BLOOD (ROUTINE X 2)  CULTURE, BLOOD (ROUTINE X 2)  LACTIC ACID, PLASMA  PROTIME-INR  LACTIC ACID, PLASMA  URINALYSIS, W/ REFLEX TO CULTURE (INFECTION SUSPECTED)     MDM  Patient presents to the emergency department with concern for sepsis with altered mental status, fever, hypoxia and soft blood pressures.  Started on a bolus of IV fluids and antibiotics to cover unknown source.  Chest x-ray with concern for right lower lobe pneumonia.  Does have some Pseudomonas risk factors given his history of COPD I do not see any history of MRSA.  Lab work pending.  Leukocytosis of 15.  Mild acute kidney injury with a creatinine elevated up to 1.35 from a baseline of 1.  Lactic acid is normal at 1.4.  Patient admitted to the hospitalist for acute hypoxic respiratory failure in the setting of a right lower lobe pneumonia, altered mental status and concern for sepsis     PROCEDURES:  Critical Care performed: yes  .Critical Care  Performed by: Punter Clotilda, MD Authorized by: Punter Clotilda, MD   Critical care provider statement:    Critical care time (minutes):  30   Critical care time was exclusive of:  Separately billable procedures and treating other patients   Critical care was necessary to treat or prevent imminent or life-threatening deterioration of the following conditions:  Sepsis   Critical care was time  spent personally by me on the following activities:  Development of treatment plan with patient or surrogate, discussions with consultants, evaluation of patient's response to treatment, examination of patient, ordering and review of laboratory studies, ordering and review of radiographic studies, ordering and performing treatments and interventions, pulse oximetry, re-evaluation of patient's condition and review of  old charts   Care discussed with: admitting provider     Patient's presentation is most consistent with acute presentation with potential threat to life or bodily function.   MEDICATIONS ORDERED IN ED: Medications  lactated ringers  infusion ( Intravenous New Bag/Given 09/17/24 1601)  metroNIDAZOLE  (FLAGYL ) IVPB 500 mg (500 mg Intravenous New Bag/Given 09/17/24 1608)  vancomycin  (VANCOCIN ) IVPB 1000 mg/200 mL premix (has no administration in time range)  sodium chloride  0.9 % bolus 500 mL (has no administration in time range)  lactated ringers  bolus 500 mL (0 mLs Intravenous Stopped 09/17/24 1600)  ceFEPIme  (MAXIPIME ) 2 g in sodium chloride  0.9 % 100 mL IVPB (0 g Intravenous Stopped 09/17/24 1559)  acetaminophen  (TYLENOL ) tablet 650 mg (650 mg Oral Given 09/17/24 1522)    FINAL CLINICAL IMPRESSION(S) / ED DIAGNOSES   Final diagnoses:  Altered mental status, unspecified altered mental status type  Community acquired pneumonia of right lower lobe of lung  Hypoxia     Rx / DC Orders   ED Discharge Orders     None        Note:  This document was prepared using Dragon voice recognition software and may include unintentional dictation errors.   Suzanne Kirsch, MD 09/17/24 1609  "

## 2024-09-18 LAB — URINALYSIS, W/ REFLEX TO CULTURE (INFECTION SUSPECTED)
Bacteria, UA: NONE SEEN
Bilirubin Urine: NEGATIVE
Glucose, UA: NEGATIVE mg/dL
Ketones, ur: NEGATIVE mg/dL
Leukocytes,Ua: NEGATIVE
Nitrite: NEGATIVE
Protein, ur: NEGATIVE mg/dL
Specific Gravity, Urine: 1.014 (ref 1.005–1.030)
Squamous Epithelial / HPF: 0 /HPF (ref 0–5)
pH: 5 (ref 5.0–8.0)

## 2024-09-18 LAB — CBG MONITORING, ED
Glucose-Capillary: 108 mg/dL — ABNORMAL HIGH (ref 70–99)
Glucose-Capillary: 115 mg/dL — ABNORMAL HIGH (ref 70–99)
Glucose-Capillary: 105 mg/dL — ABNORMAL HIGH (ref 70–99)
Glucose-Capillary: 127 mg/dL — ABNORMAL HIGH (ref 70–99)

## 2024-09-18 LAB — HEMOGLOBIN A1C
Hgb A1c MFr Bld: 5.5 % (ref 4.8–5.6)
Mean Plasma Glucose: 111.15 mg/dL

## 2024-09-18 LAB — PROTIME-INR
INR: 1.3 — ABNORMAL HIGH (ref 0.8–1.2)
Prothrombin Time: 17.1 s — ABNORMAL HIGH (ref 11.4–15.2)

## 2024-09-18 LAB — MAGNESIUM: Magnesium: 1 mg/dL — ABNORMAL LOW (ref 1.7–2.4)

## 2024-09-18 LAB — CORTISOL-AM, BLOOD: Cortisol - AM: 8.7 ug/dL (ref 6.7–22.6)

## 2024-09-18 LAB — LACTIC ACID, PLASMA: Lactic Acid, Venous: 0.7 mmol/L (ref 0.5–1.9)

## 2024-09-18 MED ORDER — GABAPENTIN 300 MG PO CAPS: 300.0000 mg | ORAL_CAPSULE | ORAL | Status: DC

## 2024-09-18 MED ORDER — TRAZODONE HCL 100 MG PO TABS
200.0000 mg | ORAL_TABLET | Freq: Every day | ORAL | Status: DC
  Administered 2024-09-18 – 2024-09-21 (×4): 200 mg via ORAL
  Filled 2024-09-18 (×2): qty 2
  Filled 2024-09-18: qty 4
  Filled 2024-09-18: qty 2

## 2024-09-18 MED ORDER — GABAPENTIN 300 MG PO CAPS
600.0000 mg | ORAL_CAPSULE | Freq: Every day | ORAL | Status: DC
  Administered 2024-09-18 – 2024-09-21 (×4): 600 mg via ORAL
  Filled 2024-09-18 (×4): qty 2

## 2024-09-18 MED ORDER — MORPHINE SULFATE 15 MG PO TABS
15.0000 mg | ORAL_TABLET | Freq: Three times a day (TID) | ORAL | Status: DC
  Administered 2024-09-18 – 2024-09-20 (×6): 15 mg via ORAL
  Filled 2024-09-18 (×6): qty 1

## 2024-09-18 MED ORDER — ASPIRIN 81 MG PO TBEC
81.0000 mg | DELAYED_RELEASE_TABLET | Freq: Every day | ORAL | Status: DC
  Administered 2024-09-18 – 2024-09-20 (×3): 81 mg via ORAL
  Filled 2024-09-18 (×3): qty 1

## 2024-09-18 MED ORDER — ATORVASTATIN CALCIUM 20 MG PO TABS
40.0000 mg | ORAL_TABLET | Freq: Every day | ORAL | Status: DC
  Administered 2024-09-18 – 2024-09-22 (×5): 40 mg via ORAL
  Filled 2024-09-18 (×5): qty 2

## 2024-09-18 MED ORDER — FAMOTIDINE 20 MG PO TABS: 20.0000 mg | ORAL_TABLET | ORAL | Status: DC | PRN

## 2024-09-18 MED ORDER — IPRATROPIUM-ALBUTEROL 0.5-2.5 (3) MG/3ML IN SOLN
3.0000 mL | Freq: Four times a day (QID) | RESPIRATORY_TRACT | Status: DC | PRN
  Administered 2024-09-20 – 2024-09-21 (×2): 3 mL via RESPIRATORY_TRACT
  Filled 2024-09-18 (×2): qty 3

## 2024-09-18 MED ORDER — COLCHICINE 0.6 MG PO TABS
0.6000 mg | ORAL_TABLET | Freq: Every day | ORAL | Status: DC
  Administered 2024-09-18 – 2024-09-22 (×5): 0.6 mg via ORAL
  Filled 2024-09-18 (×7): qty 1

## 2024-09-18 MED ORDER — CLOPIDOGREL BISULFATE 75 MG PO TABS
75.0000 mg | ORAL_TABLET | Freq: Every day | ORAL | Status: DC
  Administered 2024-09-18 – 2024-09-20 (×3): 75 mg via ORAL
  Filled 2024-09-18 (×3): qty 1

## 2024-09-18 MED ORDER — MAGNESIUM SULFATE 4 GM/100ML IV SOLN
4.0000 g | Freq: Once | INTRAVENOUS | Status: AC
  Administered 2024-09-18: 4 g via INTRAVENOUS
  Filled 2024-09-18: qty 100

## 2024-09-18 MED ORDER — PANTOPRAZOLE SODIUM 40 MG PO TBEC
40.0000 mg | DELAYED_RELEASE_TABLET | Freq: Every day | ORAL | Status: DC
  Administered 2024-09-18 – 2024-09-22 (×5): 40 mg via ORAL
  Filled 2024-09-18 (×5): qty 1

## 2024-09-18 MED ORDER — GABAPENTIN 300 MG PO CAPS
300.0000 mg | ORAL_CAPSULE | Freq: Every morning | ORAL | Status: DC
  Administered 2024-09-18 – 2024-09-22 (×5): 300 mg via ORAL
  Filled 2024-09-18 (×5): qty 1

## 2024-09-18 MED ORDER — MORPHINE SULFATE 15 MG PO TABS: 15.0000 mg | ORAL_TABLET | Freq: Three times a day (TID) | ORAL | Status: DC

## 2024-09-18 NOTE — ED Notes (Addendum)
 Pt refused to use bedside commode. This RN educated pt on importance of staying connected to oxygen due to his low oxygen saturation. Pt still refused and wanted to use room bathroom. Pt became SOB while returning to stretcher with oxygen saturation dropping to 66%. Pt on 4L at this time with 95%. MD Trudy notified.

## 2024-09-18 NOTE — Progress Notes (Signed)
" ° °  Brief Progress Note   _____________________________________________________________________________________________________________  Patient Name: Clifford Martin Patient DOB: 03-18-1941 Date: @TODAY @      Data: Reviewed labs, notes, VS.    Action: No action needed at this time.      Response:    _____________________________________________________________________________________________________________  [facility] RN Expeditor Sharolyn JONETTA Batman Please contact us  directly via secure chat (search for [facility]) or by calling us  at [phone] [facility]).  "

## 2024-09-18 NOTE — ED Notes (Signed)
 CBG taken with glucometer 105. Data did not transfer.

## 2024-09-18 NOTE — ED Notes (Signed)
 5am dose of doxy was not sent up by pharmacy, pharmacy called they are sending it up

## 2024-09-18 NOTE — Progress Notes (Signed)
 " PROGRESS NOTE   HPI was taken from Dr. Fernand: Clifford Martin is a 84 y.o. 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  Pt denying any acute conerns. He is able to state his name, location but not able to give information about situation. Wife is at bedside providing history and states patient was normal earlier and when she came back to check on him later this morning he was altered.  She called EMS and when EMS arrived they checked his temperature and it was 102. She denies any coughing or sick contacts. On arrival to the ED patient was noted to be HDS stable.  Lab work and imaging obtained.  CBC with leukocytosis at 15, normal hemoglobin.  CMP with elevated creatinine but not amounting to AKI, moderate hyperglycemia.  Lactic acid normal.  Respiratory panel negative for RSV, flu, COVID.  Chest x-ray with signs of pneumonia.  Patient initially given antibiotics to cover for unknown source.  Given need for continued care, [facility] contacted for admission.    Clifford Martin  FMW:[id number] DOB: 12-Sep-1940 DOA: 09/17/2024 PCP: [facility]   Assessment & Plan:   Principal Problem:   Sepsis due to pneumonia [facility]) Active Problems:   Hypertension   Diabetes mellitus without complication (HCC)   Acute metabolic encephalopathy   Chronic combined systolic and diastolic heart failure (HCC)   Hyperlipidemia  Assessment and Plan: Sepsis: met criteria w/ leukocytosis, tachycardia and pneumonia. Continue on IV abxs.    Pneumonia: continue on IV rocephin , doxy, bronchodilators & encourage incentive spirometry   Acute metabolic encephalopathy: Likely in setting of pneumonia as above. Improved. High risk for hospital delirium   Hypertension: holding all home anti-HTN meds as BP is low end of normal    HLD: restart home dose of statin   DM2: well  controlled, HbA1c 5.5. Continue on SSI w/ accuchecks   Hypomagnesemia: mg sulfate ordered  Chronic pain: restart home dose of morphine  immediate release but pharmacy said this medication has not been filled in months   DVT prophylaxis: heparin   Code Status: Full  Family Communication:  Disposition Plan: likely d/c back home   Level of care: Progressive  Status is: Inpatient Remains inpatient appropriate because: severity of illness    Consultants:    Procedures:   Antimicrobials: rocephin , doxy   Subjective: Pt c/o pain and shortness of breath   Objective: Vitals:   09/18/24 0745 09/18/24 0800 09/18/24 0813 09/18/24 0820  BP:  104/60 104/60   Pulse: 81 78 78   Resp: 17 16 17    Temp:    98.6 F (37 C)  TempSrc:    Oral  SpO2:   100%   Weight:      Height:        Intake/Output Summary (Last 24 hours) at 09/18/2024 0945 Last data filed at 09/18/2024 0118 Gross per 24 hour  Intake 1639.82 ml  Output 200 ml  Net 1439.82 ml   Filed Weights   09/18/24 0322  Weight: 79.8 kg    Examination:  General exam: Appears calm and comfortable  Respiratory system: decreased breath sounds b/l  Cardiovascular system: S1 & S2+. No  rubs, gallops or clicks.  Gastrointestinal system: Abdomen is nondistended, soft and nontender. Normal bowel sounds heard. Central nervous system: Alert and oriented. Moves all extremities  Psychiatry: Judgement and insight appear normal.  Mood & affect appropriate.     Data Reviewed: I have personally reviewed following labs and imaging studies  CBC: Recent Labs  Lab 09/17/24 1517  WBC 15.0*  NEUTROABS 13.4*  HGB 14.1  HCT 42.0  MCV 90.7  PLT 169   Basic Metabolic Panel: Recent Labs  Lab 09/17/24 1517 09/18/24 0417  NA 140  --   K 4.1  --   CL 101  --   CO2 29  --   GLUCOSE 169*  --   BUN 13  --   CREATININE 1.35*  --   CALCIUM  9.2  --   MG  --  1.0*   GFR: Estimated Creatinine Clearance: 44.3 mL/min (A) (by C-G formula  based on SCr of 1.35 mg/dL (H)). Liver Function Tests: Recent Labs  Lab 09/17/24 1517  AST 27  ALT 14  ALKPHOS 79  BILITOT 1.1  PROT 7.3  ALBUMIN  4.6   No results for input(s): LIPASE, AMYLASE in the last 168 hours. No results for input(s): AMMONIA in the last 168 hours. Coagulation Profile: Recent Labs  Lab 09/17/24 1517 09/18/24 0417  INR 1.1 1.3*   Cardiac Enzymes: No results for input(s): CKTOTAL, CKMB, CKMBINDEX, TROPONINI in the last 168 hours. BNP (last 3 results) No results for input(s): PROBNP in the last 8760 hours. HbA1C: Recent Labs    09/17/24 1517  HGBA1C 5.5   CBG: Recent Labs  Lab 09/17/24 2247 09/18/24 0808  GLUCAP 112* 108*   Lipid Profile: No results for input(s): CHOL, HDL, LDLCALC, TRIG, CHOLHDL, LDLDIRECT in the last 72 hours. Thyroid  Function Tests: No results for input(s): TSH, T4TOTAL, FREET4, T3FREE, THYROIDAB in the last 72 hours. Anemia Panel: No results for input(s): VITAMINB12, FOLATE, FERRITIN, TIBC, IRON, RETICCTPCT in the last 72 hours. Sepsis Labs: Recent Labs  Lab 09/17/24 1517 09/18/24 0417  LATICACIDVEN 1.4 0.7    Recent Results (from the past 240 hours)  Resp panel by RT-PCR (RSV, Flu A&B, Covid) Anterior Nasal Swab     Status: None   Collection Time: 09/17/24  3:17 PM   Specimen: Anterior Nasal Swab  Result Value Ref Range Status   SARS Coronavirus 2 by RT PCR NEGATIVE NEGATIVE Final    Comment: (NOTE) SARS-CoV-2 target nucleic acids are NOT DETECTED.  The SARS-CoV-2 RNA is generally detectable in upper respiratory specimens during the acute phase of infection. The lowest concentration of SARS-CoV-2 viral copies this assay can detect is 138 copies/mL. A negative result does not preclude SARS-Cov-2 infection and should not be used as the sole basis for treatment or other patient management decisions. A negative result may occur with  improper specimen  collection/handling, submission of specimen other than nasopharyngeal swab, presence of viral mutation(s) within the areas targeted by this assay, and inadequate number of viral copies(<138 copies/mL). A negative result must be combined with clinical observations, patient history, and epidemiological information. The expected result is Negative.  Fact Sheet for Patients:  bloggercourse.com  Fact Sheet for Healthcare Providers:  seriousbroker.it  This test is no t yet approved or cleared by the [place]  FDA and  has been authorized for detection and/or diagnosis of SARS-CoV-2 by FDA under an Emergency Use Authorization (EUA). This EUA will remain  in effect (meaning this test can be used) for the duration of the COVID-19 declaration under Section 564(b)(1) of the Act, 21 U.S.C.section 360bbb-3(b)(1), unless the authorization is terminated  or revoked sooner.       Influenza A by PCR NEGATIVE NEGATIVE  Final   Influenza B by PCR NEGATIVE NEGATIVE Final    Comment: (NOTE) The Xpert Xpress SARS-CoV-2/FLU/RSV plus assay is intended as an aid in the diagnosis of influenza from Nasopharyngeal swab specimens and should not be used as a sole basis for treatment. Nasal washings and aspirates are unacceptable for Xpert Xpress SARS-CoV-2/FLU/RSV testing.  Fact Sheet for Patients: bloggercourse.com  Fact Sheet for Healthcare Providers: seriousbroker.it  This test is not yet approved or cleared by the [place]  FDA and has been authorized for detection and/or diagnosis of SARS-CoV-2 by FDA under an Emergency Use Authorization (EUA). This EUA will remain in effect (meaning this test can be used) for the duration of the COVID-19 declaration under Section 564(b)(1) of the Act, 21 U.S.C. section 360bbb-3(b)(1), unless the authorization is terminated or revoked.     Resp Syncytial  Virus by PCR NEGATIVE NEGATIVE Final    Comment: (NOTE) Fact Sheet for Patients: bloggercourse.com  Fact Sheet for Healthcare Providers: seriousbroker.it  This test is not yet approved or cleared by the [place]  FDA and has been authorized for detection and/or diagnosis of SARS-CoV-2 by FDA under an Emergency Use Authorization (EUA). This EUA will remain in effect (meaning this test can be used) for the duration of the COVID-19 declaration under Section 564(b)(1) of the Act, 21 U.S.C. section 360bbb-3(b)(1), unless the authorization is terminated or revoked.  Performed at [facility], [address] [place], [place] [address]   Blood Culture (routine x 2)     Status: None (Preliminary result)   Collection Time: 09/17/24  3:17 PM   Specimen: BLOOD LEFT FOREARM  Result Value Ref Range Status   Specimen Description BLOOD LEFT FOREARM  Final   Special Requests   Final    BOTTLES DRAWN AEROBIC AND ANAEROBIC Blood Culture adequate volume   Culture   Final    NO GROWTH < 24 HOURS Performed at [facility], [address], [place], [place] [address]    Report Status PENDING  Incomplete  Blood Culture (routine x 2)     Status: None (Preliminary result)   Collection Time: 09/17/24  3:21 PM   Specimen: Right Antecubital; Blood  Result Value Ref Range Status   Specimen Description RIGHT ANTECUBITAL  Final   Special Requests   Final    BOTTLES DRAWN AEROBIC AND ANAEROBIC Blood Culture results may not be optimal due to an inadequate volume of blood received in culture bottles   Culture   Final    NO GROWTH < 24 HOURS Performed at [facility], [address] [place], [place] [address]    Report Status PENDING  Incomplete         Radiology Studies: DG Chest Port 1 View Result Date: 09/17/2024 CLINICAL DATA:  Sepsis, altered level of consciousness EXAM: PORTABLE CHEST 1 VIEW COMPARISON:   08/03/2022 FINDINGS: Single frontal view of the chest demonstrates stable enlargement of the cardiac silhouette. There is new opacification at the right lung base, consistent with right basilar pneumonia. No effusion or pneumothorax. No acute bony abnormalities. IMPRESSION: 1. Right basilar opacification consistent with pneumonia. Followup PA and lateral chest X-ray is recommended in 3-4 weeks following trial of antibiotic therapy to ensure resolution and exclude underlying malignancy. 2. Stable enlarged cardiac silhouette. Electronically Signed   By: Ozell Daring M.D.   On: 09/17/2024 16:32        Scheduled Meds:  heparin   5,000 Units Subcutaneous Q8H   insulin  aspart  0-5 Units Subcutaneous  QHS   insulin  aspart  0-9 Units Subcutaneous TID WC   Continuous Infusions:  cefTRIAXone  (ROCEPHIN )  IV Stopped (09/17/24 1749)   doxycycline  (VIBRAMYCIN ) IV 100 mg (09/18/24 0733)   lactated ringers  Stopped (09/18/24 9266)   magnesium  sulfate bolus IVPB       LOS: 1 day       Anthony CHRISTELLA Pouch, MD [facility] Pager [phone]  If 7PM-7AM, please contact night-coverage [email or web address] 09/18/2024, 9:45 AM   "

## 2024-09-19 DIAGNOSIS — G9341 Metabolic encephalopathy: Secondary | ICD-10-CM | POA: Diagnosis not present

## 2024-09-19 DIAGNOSIS — J189 Pneumonia, unspecified organism: Secondary | ICD-10-CM | POA: Diagnosis present

## 2024-09-19 DIAGNOSIS — I5042 Chronic combined systolic (congestive) and diastolic (congestive) heart failure: Secondary | ICD-10-CM | POA: Diagnosis not present

## 2024-09-19 DIAGNOSIS — I10 Essential (primary) hypertension: Secondary | ICD-10-CM | POA: Diagnosis not present

## 2024-09-19 DIAGNOSIS — A419 Sepsis, unspecified organism: Secondary | ICD-10-CM | POA: Diagnosis not present

## 2024-09-19 DIAGNOSIS — I4891 Unspecified atrial fibrillation: Secondary | ICD-10-CM | POA: Diagnosis not present

## 2024-09-19 DIAGNOSIS — I480 Paroxysmal atrial fibrillation: Secondary | ICD-10-CM | POA: Diagnosis not present

## 2024-09-19 LAB — BASIC METABOLIC PANEL WITH GFR
Anion gap: 12 (ref 5–15)
BUN: 11 mg/dL (ref 8–23)
CO2: 24 mmol/L (ref 22–32)
Calcium: 9.1 mg/dL (ref 8.9–10.3)
Chloride: 107 mmol/L (ref 98–111)
Creatinine, Ser: 1.01 mg/dL (ref 0.61–1.24)
GFR, Estimated: >60 mL/min
Glucose, Bld: 111 mg/dL — ABNORMAL HIGH (ref 70–99)
Potassium: 3.7 mmol/L (ref 3.5–5.1)
Sodium: 142 mmol/L (ref 135–145)

## 2024-09-19 LAB — CBC
HCT: 37.8 % — ABNORMAL LOW (ref 39.0–52.0)
Hemoglobin: 12.7 g/dL — ABNORMAL LOW (ref 13.0–17.0)
MCH: 30.6 pg (ref 26.0–34.0)
MCHC: 33.6 g/dL (ref 30.0–36.0)
MCV: 91.1 fL (ref 80.0–100.0)
Platelets: 149 K/uL — ABNORMAL LOW (ref 150–400)
RBC: 4.15 MIL/uL — ABNORMAL LOW (ref 4.22–5.81)
RDW: 12.8 % (ref 11.5–15.5)
WBC: 7.9 K/uL (ref 4.0–10.5)
nRBC: 0 % (ref 0.0–0.2)

## 2024-09-19 LAB — GLUCOSE, CAPILLARY: Glucose-Capillary: 112 mg/dL — ABNORMAL HIGH (ref 70–99)

## 2024-09-19 LAB — MAGNESIUM: Magnesium: 1.9 mg/dL (ref 1.7–2.4)

## 2024-09-19 LAB — CBG MONITORING, ED
Glucose-Capillary: 111 mg/dL — ABNORMAL HIGH (ref 70–99)
Glucose-Capillary: 150 mg/dL — ABNORMAL HIGH (ref 70–99)
Glucose-Capillary: 167 mg/dL — ABNORMAL HIGH (ref 70–99)

## 2024-09-19 NOTE — Progress Notes (Signed)
" ° °  Brief Progress Note   _____________________________________________________________________________________________________________  Patient Name: Clifford Martin Patient DOB: 11/29/1940 Date: @TODAY @      Data: 84 yo male currently awaiting admission to a Progressive bed at [facility].    Action: Reached out to Dr. Mosie to inquire about the possibility of downgrading patient to a telemetry or med-surg level of care.    Response: Dr. Mosie will update the patients level of care to reflect telemetry status.  _____________________________________________________________________________________________________________  [facility] RN Expeditor Norval Slaven S Anisten Tomassi Please contact us  directly via secure chat (search for [facility]) or by calling us  at [phone] [facility]).  "

## 2024-09-19 NOTE — Progress Notes (Signed)
 " PROGRESS NOTE    Clifford Martin  FMW:[id number] DOB: Jan 23, 1941 DOA: 09/17/2024 PCP: [facility]    Brief Narrative:  HPI was taken from Dr. Fernand: Clifford Martin is a 84 y.o. 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  Pt denying any acute conerns. He is able to state his name, location but not able to give information about situation. Wife is at bedside providing history and states patient was normal earlier and when she came back to check on him later this morning he was altered.  She called EMS and when EMS arrived they checked his temperature and it was 102. She denies any coughing or sick contacts. On arrival to the ED patient was noted to be HDS stable.  Lab work and imaging obtained.  CBC with leukocytosis at 15, normal hemoglobin.  CMP with elevated creatinine but not amounting to AKI, moderate hyperglycemia.  Lactic acid normal.  Respiratory panel negative for RSV, flu, COVID.  Chest x-ray with signs of pneumonia.  Patient initially given antibiotics to cover for unknown source.  Given need for continued care, [facility] contacted for admission.   Assessment and Plan:  Sepsis - resolved - met criteria w/ leukocytosis, tachycardia and pneumonia.  - continue management of pneumonia as below   Pneumonia:  - Afebrile, leukocytosis resolved - Continue IV rocephin  and doxy - Bronchodilators & encourage incentive spirometry    Acute metabolic encephalopathy - improving - Likely in setting of pneumonia as above. Improved.  - High risk for hospital delirium. Delirium precautions   Hypertension:  - BP labile  Continue holding home anti-HTN meds until BP normalizes   HLD - Continue home statin   DM2:  - Well controlled, HbA1c 5.5.  - Continue on SSI 0-9 AC and 05- HSw/ accuchecks    Hypomagnesemia - resolved   Chronic pain:   - Continue home dose of morphine  immediate release ( pharmacy said this medication has not been filled in months)   Scheduled Meds:  aspirin  EC  81 mg Oral Daily   atorvastatin   40 mg Oral Daily   clopidogrel   75 mg Oral Daily   colchicine   0.6 mg Oral Daily   gabapentin   300 mg Oral q morning   gabapentin   600 mg Oral QHS   heparin   5,000 Units Subcutaneous Q8H   insulin  aspart  0-5 Units Subcutaneous QHS   insulin  aspart  0-9 Units Subcutaneous TID WC   morphine   15 mg Oral Q8H   pantoprazole   40 mg Oral Daily   traZODone   200 mg Oral QHS   Continuous Infusions:  cefTRIAXone  (ROCEPHIN )  IV Stopped (09/18/24 1625)   doxycycline  (VIBRAMYCIN ) IV 100 mg (09/19/24 0823)   PRN Meds:.acetaminophen  **OR** acetaminophen , famotidine , ipratropium-albuterol , ondansetron  **OR** ondansetron  (ZOFRAN ) IV  Current Outpatient Medications  Medication Instructions   albuterol  (VENTOLIN  HFA) 108 (90 Base) MCG/ACT inhaler 2 puffs, Every 6 hours PRN   aspirin  EC 81 mg, Daily   atorvastatin  (LIPITOR) 40 mg, Nightly   clopidogrel  (PLAVIX ) 75 mg, Oral, Daily   colchicine  0.6 mg, Oral, Daily   famotidine  (PEPCID ) 20 mg, As needed   furosemide  (LASIX ) 40 mg, Oral, Daily   gabapentin  (NEURONTIN ) 300-600 mg, See admin instructions   ibuprofen (ADVIL) 400 mg, Every 6 hours PRN   lisinopril  (ZESTRIL ) 5 mg, 2 times daily   metFORMIN (  GLUCOPHAGE) 1,000 mg, Nightly   methocarbamol (ROBAXIN) 500 mg, Every 8 hours PRN   methylPREDNISolone  (MEDROL  DOSEPAK) 4 MG TBPK tablet Take according to the package insert.   metoprolol  tartrate (LOPRESSOR ) 50 mg, 2 times daily   montelukast (SINGULAIR) 10 mg, Daily at bedtime   morphine  (MSIR) 15 mg, Oral, Every 8 hours   Multiple Vitamin (MULTIVITAMIN WITH MINERALS) TABS tablet 1 tablet, Daily   naloxone (NARCAN) nasal spray 4 mg/0.1 mL 1 spray, As needed   omeprazole (PRILOSEC) 20 mg, Oral, Daily   traZODone  (DESYREL ) 200 mg, Oral, Daily at bedtime    DVT  prophylaxis: heparin  injection 5,000 Units Start: 09/17/24 2200   Code Status:   Code Status: Full Code  Family Communication: None  Disposition Plan: Likely home pending improvement clinically PT -   -   OT -   -    Level of care: Telemetry  Consultants:  None  Procedures:  None  Antimicrobials: Rocephin  and doxy   Subjective: NAEO. Seen sitting at bedside watching TV. Patient feels well today. Mental status is back to normal.   Objective: Vitals:   09/19/24 0830 09/19/24 0900 09/19/24 0930 09/19/24 1000  BP: 135/63 (!) 136/54 (!) 127/102 (!) 118/53  Pulse: 79 83 86 94  Resp: 16 20 (!) 24 20  Temp:      TempSrc:      SpO2:      Weight:      Height:        Intake/Output Summary (Last 24 hours) at 09/19/2024 1151 Last data filed at 09/19/2024 0456 Gross per 24 hour  Intake --  Output 1100 ml  Net -1100 ml   Filed Weights   09/18/24 0322  Weight: 79.8 kg    Examination:  Gen: NAD, A&Ox3 Neck: Supple CV: RRR, no murmurs Resp: normal WOB, CTAB, no w/r/r Abd: Soft, NTND, no guarding Ext: No LE edema Skin: Warm, dry Neuro: No focal deficits Psych: Calm, cooperative, appropriate affect    Data Reviewed: I have personally reviewed following labs and imaging studies  CBC: Recent Labs  Lab 09/17/24 1517 09/19/24 0441  WBC 15.0* 7.9  NEUTROABS 13.4*  --   HGB 14.1 12.7*  HCT 42.0 37.8*  MCV 90.7 91.1  PLT 169 149*   Basic Metabolic Panel: Recent Labs  Lab 09/17/24 1517 09/18/24 0417 09/19/24 0441  NA 140  --  142  K 4.1  --  3.7  CL 101  --  107  CO2 29  --  24  GLUCOSE 169*  --  111*  BUN 13  --  11  CREATININE 1.35*  --  1.01  CALCIUM  9.2  --  9.1  MG  --  1.0* 1.9   GFR: Estimated Creatinine Clearance: 59.2 mL/min (by C-G formula based on SCr of 1.01 mg/dL). Liver Function Tests: Recent Labs  Lab 09/17/24 1517  AST 27  ALT 14  ALKPHOS 79  BILITOT 1.1  PROT 7.3  ALBUMIN  4.6   No results for input(s): LIPASE, AMYLASE  in the last 168 hours. No results for input(s): AMMONIA in the last 168 hours. Coagulation Profile: Recent Labs  Lab 09/17/24 1517 09/18/24 0417  INR 1.1 1.3*   Cardiac Enzymes: No results for input(s): CKTOTAL, CKMB, CKMBINDEX, TROPONINI in the last 168 hours. BNP (last 3 results) No results for input(s): PROBNP in the last 8760 hours. HbA1C: Recent Labs    09/17/24 1517  HGBA1C 5.5   CBG: Recent Labs  Lab 09/18/24 [phone]  09/18/24 1137 09/18/24 1635 09/18/24 2203 09/19/24 0751  GLUCAP 108* 115* 105* 127* 111*   Lipid Profile: No results for input(s): CHOL, HDL, LDLCALC, TRIG, CHOLHDL, LDLDIRECT in the last 72 hours. Thyroid  Function Tests: No results for input(s): TSH, T4TOTAL, FREET4, T3FREE, THYROIDAB in the last 72 hours. Anemia Panel: No results for input(s): VITAMINB12, FOLATE, FERRITIN, TIBC, IRON, RETICCTPCT in the last 72 hours. Sepsis Labs: Recent Labs  Lab 09/17/24 1517 09/18/24 0417  LATICACIDVEN 1.4 0.7    Recent Results (from the past 240 hours)  Resp panel by RT-PCR (RSV, Flu A&B, Covid) Anterior Nasal Swab     Status: None   Collection Time: 09/17/24  3:17 PM   Specimen: Anterior Nasal Swab  Result Value Ref Range Status   SARS Coronavirus 2 by RT PCR NEGATIVE NEGATIVE Final    Comment: (NOTE) SARS-CoV-2 target nucleic acids are NOT DETECTED.  The SARS-CoV-2 RNA is generally detectable in upper respiratory specimens during the acute phase of infection. The lowest concentration of SARS-CoV-2 viral copies this assay can detect is 138 copies/mL. A negative result does not preclude SARS-Cov-2 infection and should not be used as the sole basis for treatment or other patient management decisions. A negative result may occur with  improper specimen collection/handling, submission of specimen other than nasopharyngeal swab, presence of viral mutation(s) within the areas targeted by this assay, and  inadequate number of viral copies(<138 copies/mL). A negative result must be combined with clinical observations, patient history, and epidemiological information. The expected result is Negative.  Fact Sheet for Patients:  bloggercourse.com  Fact Sheet for Healthcare Providers:  seriousbroker.it  This test is no t yet approved or cleared by the [place]  FDA and  has been authorized for detection and/or diagnosis of SARS-CoV-2 by FDA under an Emergency Use Authorization (EUA). This EUA will remain  in effect (meaning this test can be used) for the duration of the COVID-19 declaration under Section 564(b)(1) of the Act, 21 U.S.C.section 360bbb-3(b)(1), unless the authorization is terminated  or revoked sooner.       Influenza A by PCR NEGATIVE NEGATIVE Final   Influenza B by PCR NEGATIVE NEGATIVE Final    Comment: (NOTE) The Xpert Xpress SARS-CoV-2/FLU/RSV plus assay is intended as an aid in the diagnosis of influenza from Nasopharyngeal swab specimens and should not be used as a sole basis for treatment. Nasal washings and aspirates are unacceptable for Xpert Xpress SARS-CoV-2/FLU/RSV testing.  Fact Sheet for Patients: bloggercourse.com  Fact Sheet for Healthcare Providers: seriousbroker.it  This test is not yet approved or cleared by the [place]  FDA and has been authorized for detection and/or diagnosis of SARS-CoV-2 by FDA under an Emergency Use Authorization (EUA). This EUA will remain in effect (meaning this test can be used) for the duration of the COVID-19 declaration under Section 564(b)(1) of the Act, 21 U.S.C. section 360bbb-3(b)(1), unless the authorization is terminated or revoked.     Resp Syncytial Virus by PCR NEGATIVE NEGATIVE Final    Comment: (NOTE) Fact Sheet for Patients: bloggercourse.com  Fact Sheet for Healthcare  Providers: seriousbroker.it  This test is not yet approved or cleared by the [place]  FDA and has been authorized for detection and/or diagnosis of SARS-CoV-2 by FDA under an Emergency Use Authorization (EUA). This EUA will remain in effect (meaning this test can be used) for the duration of the COVID-19 declaration under Section 564(b)(1) of the Act, 21 U.S.C. section 360bbb-3(b)(1), unless the authorization is terminated or revoked.  Performed  at [facility], [address] [place], [place] [address]   Blood Culture (routine x 2)     Status: None (Preliminary result)   Collection Time: 09/17/24  3:17 PM   Specimen: BLOOD LEFT FOREARM  Result Value Ref Range Status   Specimen Description BLOOD LEFT FOREARM  Final   Special Requests   Final    BOTTLES DRAWN AEROBIC AND ANAEROBIC Blood Culture adequate volume   Culture   Final    NO GROWTH 2 DAYS Performed at [facility], [address], [place], [place] [address]    Report Status PENDING  Incomplete  Blood Culture (routine x 2)     Status: None (Preliminary result)   Collection Time: 09/17/24  3:21 PM   Specimen: Right Antecubital; Blood  Result Value Ref Range Status   Specimen Description RIGHT ANTECUBITAL  Final   Special Requests   Final    BOTTLES DRAWN AEROBIC AND ANAEROBIC Blood Culture results may not be optimal due to an inadequate volume of blood received in culture bottles   Culture   Final    NO GROWTH 2 DAYS Performed at [facility], [address], [place], [place] [address]    Report Status PENDING  Incomplete     Radiology Studies: DG Chest Port 1 View Result Date: 09/17/2024 CLINICAL DATA:  Sepsis, altered level of consciousness EXAM: PORTABLE CHEST 1 VIEW COMPARISON:  08/03/2022 FINDINGS: Single frontal view of the chest demonstrates stable enlargement of the cardiac silhouette. There is new opacification at the right lung base, consistent with  right basilar pneumonia. No effusion or pneumothorax. No acute bony abnormalities. IMPRESSION: 1. Right basilar opacification consistent with pneumonia. Followup PA and lateral chest X-ray is recommended in 3-4 weeks following trial of antibiotic therapy to ensure resolution and exclude underlying malignancy. 2. Stable enlarged cardiac silhouette. Electronically Signed   By: Ozell Daring M.D.   On: 09/17/2024 16:32    Scheduled Meds:  aspirin  EC  81 mg Oral Daily   atorvastatin   40 mg Oral Daily   clopidogrel   75 mg Oral Daily   colchicine   0.6 mg Oral Daily   gabapentin   300 mg Oral q morning   gabapentin   600 mg Oral QHS   heparin   5,000 Units Subcutaneous Q8H   insulin  aspart  0-5 Units Subcutaneous QHS   insulin  aspart  0-9 Units Subcutaneous TID WC   morphine   15 mg Oral Q8H   pantoprazole   40 mg Oral Daily   traZODone   200 mg Oral QHS   Continuous Infusions:  cefTRIAXone  (ROCEPHIN )  IV Stopped (09/18/24 1625)   doxycycline  (VIBRAMYCIN ) IV 100 mg (09/19/24 0823)     Unresulted Labs (From admission, onward)     Start     Ordered   09/20/24 0500  CBC  Tomorrow morning,   R        09/19/24 2356   09/20/24 0500  Basic metabolic panel with GFR  Tomorrow morning,   R        09/19/24 2356             LOS:  LOS: 2 days   Time Spent: 45 minutes  Manav Pierotti Al-Sultani, MD [facility]  If 7PM-7AM, please contact night-coverage  09/19/2024, 11:51 AM      "

## 2024-09-20 ENCOUNTER — Telehealth (HOSPITAL_COMMUNITY): Payer: Self-pay

## 2024-09-20 ENCOUNTER — Other Ambulatory Visit (HOSPITAL_COMMUNITY): Payer: Self-pay

## 2024-09-20 DIAGNOSIS — I4891 Unspecified atrial fibrillation: Secondary | ICD-10-CM | POA: Diagnosis not present

## 2024-09-20 DIAGNOSIS — I483 Typical atrial flutter: Secondary | ICD-10-CM

## 2024-09-20 DIAGNOSIS — G9341 Metabolic encephalopathy: Secondary | ICD-10-CM | POA: Diagnosis not present

## 2024-09-20 DIAGNOSIS — I10 Essential (primary) hypertension: Secondary | ICD-10-CM

## 2024-09-20 DIAGNOSIS — I4819 Other persistent atrial fibrillation: Secondary | ICD-10-CM

## 2024-09-20 DIAGNOSIS — R4182 Altered mental status, unspecified: Secondary | ICD-10-CM

## 2024-09-20 DIAGNOSIS — R0902 Hypoxemia: Secondary | ICD-10-CM

## 2024-09-20 DIAGNOSIS — J189 Pneumonia, unspecified organism: Secondary | ICD-10-CM | POA: Diagnosis not present

## 2024-09-20 LAB — CBC
HCT: 39.4 % (ref 39.0–52.0)
Hemoglobin: 13.2 g/dL (ref 13.0–17.0)
MCH: 30.6 pg (ref 26.0–34.0)
MCHC: 33.5 g/dL (ref 30.0–36.0)
MCV: 91.2 fL (ref 80.0–100.0)
Platelets: 164 K/uL (ref 150–400)
RBC: 4.32 MIL/uL (ref 4.22–5.81)
RDW: 12.5 % (ref 11.5–15.5)
WBC: 7.9 K/uL (ref 4.0–10.5)
nRBC: 0 % (ref 0.0–0.2)

## 2024-09-20 LAB — GLUCOSE, CAPILLARY
Glucose-Capillary: 138 mg/dL — ABNORMAL HIGH (ref 70–99)
Glucose-Capillary: 184 mg/dL — ABNORMAL HIGH (ref 70–99)
Glucose-Capillary: 132 mg/dL — ABNORMAL HIGH (ref 70–99)
Glucose-Capillary: 139 mg/dL — ABNORMAL HIGH (ref 70–99)

## 2024-09-20 LAB — BASIC METABOLIC PANEL WITH GFR
Anion gap: 13 (ref 5–15)
BUN: 9 mg/dL (ref 8–23)
CO2: 21 mmol/L — ABNORMAL LOW (ref 22–32)
Calcium: 9.4 mg/dL (ref 8.9–10.3)
Chloride: 106 mmol/L (ref 98–111)
Creatinine, Ser: 1.04 mg/dL (ref 0.61–1.24)
GFR, Estimated: >60 mL/min
Glucose, Bld: 111 mg/dL — ABNORMAL HIGH (ref 70–99)
Potassium: 3.7 mmol/L (ref 3.5–5.1)
Sodium: 140 mmol/L (ref 135–145)

## 2024-09-20 LAB — MAGNESIUM: Magnesium: 1.5 mg/dL — ABNORMAL LOW (ref 1.7–2.4)

## 2024-09-20 LAB — PHOSPHORUS: Phosphorus: 2.3 mg/dL — ABNORMAL LOW (ref 2.5–4.6)

## 2024-09-20 LAB — TSH: TSH: 1.06 u[IU]/mL (ref 0.350–4.500)

## 2024-09-20 MED ORDER — AMIODARONE HCL IN DEXTROSE 360-4.14 MG/200ML-% IV SOLN
30.0000 mg/h | INTRAVENOUS | Status: DC
  Administered 2024-09-20 – 2024-09-21 (×2): 30 mg/h via INTRAVENOUS
  Filled 2024-09-20: qty 200

## 2024-09-20 MED ORDER — APIXABAN 5 MG PO TABS
5.0000 mg | ORAL_TABLET | Freq: Two times a day (BID) | ORAL | Status: DC
  Administered 2024-09-20 – 2024-09-22 (×5): 5 mg via ORAL
  Filled 2024-09-20 (×5): qty 1

## 2024-09-20 MED ORDER — POTASSIUM PHOSPHATES 15 MMOLE/5ML IV SOLN
15.0000 mmol | Freq: Once | INTRAVENOUS | Status: AC
  Administered 2024-09-20: 15 mmol via INTRAVENOUS
  Filled 2024-09-20 (×2): qty 5

## 2024-09-20 MED ORDER — DOXYCYCLINE HYCLATE 100 MG PO TABS
100.0000 mg | ORAL_TABLET | Freq: Two times a day (BID) | ORAL | Status: AC
  Administered 2024-09-20 – 2024-09-22 (×4): 100 mg via ORAL
  Filled 2024-09-20 (×4): qty 1

## 2024-09-20 MED ORDER — MORPHINE SULFATE ER 15 MG PO TBCR
15.0000 mg | EXTENDED_RELEASE_TABLET | Freq: Three times a day (TID) | ORAL | Status: DC
  Administered 2024-09-20 – 2024-09-22 (×7): 15 mg via ORAL
  Filled 2024-09-20 (×7): qty 1

## 2024-09-20 MED ORDER — POTASSIUM CHLORIDE CRYS ER 20 MEQ PO TBCR
40.0000 meq | EXTENDED_RELEASE_TABLET | Freq: Once | ORAL | Status: AC
  Administered 2024-09-20: 40 meq via ORAL
  Filled 2024-09-20: qty 2

## 2024-09-20 MED ORDER — AMIODARONE LOAD VIA INFUSION
150.0000 mg | Freq: Once | INTRAVENOUS | Status: AC
  Administered 2024-09-20: 150 mg via INTRAVENOUS
  Filled 2024-09-20: qty 83.34

## 2024-09-20 MED ORDER — AMIODARONE HCL IN DEXTROSE 360-4.14 MG/200ML-% IV SOLN
60.0000 mg/h | INTRAVENOUS | Status: DC
  Administered 2024-09-20 (×2): 60 mg/h via INTRAVENOUS
  Filled 2024-09-20 (×2): qty 200

## 2024-09-20 MED ORDER — METOPROLOL TARTRATE 50 MG PO TABS
50.0000 mg | ORAL_TABLET | Freq: Two times a day (BID) | ORAL | Status: AC
  Administered 2024-09-20 – 2024-09-21 (×4): 50 mg via ORAL
  Filled 2024-09-20 (×4): qty 1

## 2024-09-20 MED ORDER — MAGNESIUM SULFATE 2 GM/50ML IV SOLN
2.0000 g | Freq: Once | INTRAVENOUS | Status: AC
  Administered 2024-09-20: 2 g via INTRAVENOUS
  Filled 2024-09-20: qty 50

## 2024-09-20 NOTE — Consult Note (Signed)
 "  Cardiology Consultation   Patient ID: LEIB ELAHI MRN: [id number]; DOB: 11-Jun-1941  Admit date: 09/17/2024 Date of Consult: 09/20/2024  PCP:  [facility] Providers Cardiologist:New to [facility]  - Physician requesting consult: Dr. Mosie Reason for consult: Atrial fibrillation with RVR  Patient Profile: TAYJON HALLADAY is a 84 y.o. 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 Skillman is a 84 year old gentleman with history as detailed above, presenting with disorientation, fever, hypoxia saturations 86% requiring nasal cannula oxygen, leukocytosis, chest x-ray with right basilar opacification concerning for pneumonia, started on broad-spectrum antibiotics, IV fluids  This morning converting to atrial fibrillation/flutter 9 AM He is relatively asymptomatic, denies prior episodes of tachyarrhythmia Denies clear escalation of his shortness of breath, denies significant palpitations EKG confirming atrial flutter rate 111 bpm Family at the bedside   Past Medical History:  Diagnosis Date   Anemia    Arthritis    CHF (congestive heart failure) (HCC)    COPD (chronic obstructive pulmonary disease) (HCC)    Coronary artery disease 11/14/2005   a.) NSTEMI 11/14/05 -> LHC 11/15/05: 100% RCA, 100% OM2, 75% RI, 75-95% LAD; consult CVTS. b.) 3v CABG 11/18/2005. c.) NSTEMI 09 -> LHC 90% RI -> 2.5x12 mm Microdriver BMS. d.) LHC 02/01/14: 70% pLAD, 80-90% mLAD -> 2.25x12 mm (dLAD) and 2.75x20 mm (pLAD) Promus DES. e.) LHC 12/31/15: 90% pRCA, CTO mRCA ->3.5x16 mm pRCA and 2.5x32 mm Promus  DES. f.) LHC 08/31/18: 90% RPLB ->2.5x30 mm Res Onxy DES   Dyspnea    GERD (gastroesophageal reflux disease)     History of atrial flutter 11/19/2005   a.) single episode following extubation from CABG --> DCCV x single 200J shock restored NSR.   History of heart artery stent 2009   6 TOTAL --> a.) 2.5 x 12 Microdriver BMS to RI (2009), b.) overlapping 2.25 x 12 mm (dLAD) and 2.75 x 20 mm (pLAD) Promus Premier DES (02/01/2014). c.) 3.5 x 16 mm (PRCA) and 2.5 x 32 mm(mRCA) Promus Premier (12/31/2015). d.) 2.5 x 30 mm Resolute Onxy to RPLB (08/31/2018)   Hyperlipidemia    Hypertension    Incomplete right bundle branch block (RBBB)    Ischemic cardiomyopathy    Long term current use of antithrombotics/antiplatelets    a.) DAPT therapy (ASA + clopidogrel )   NSTEMI (non-ST elevated myocardial infarction) (HCC) 11/14/2005   a.) LHC 11/15/2005: 100% RCA, 100% OM2, 75% RI, 75-95% LAD; CVTS consulted. b.) 3v CABG 11/19/2015: LIMA-LAD, SVG-OM2, SVG-RCA; severe pericarditis noted intraoperatively. LAD & OM intramyocardial --> required high doses of epi and milrinone to come off bypass. Extubated POD1, however developed resp distress and A.flutter --> DCCV x 1 and reintubated. Weaned from ventilator on POD4.   NSTEMI (non-ST elevated myocardial infarction) (HCC) 2009   a.) LHC --> 90% RI --> PCI performed placing a 2.5 x 12 mm Microdriver BMS.   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   a.) LIMA-LAD, SVG-OM2, SVG-RCA   Sepsis (HCC)    Sleep apnea    a.) no nocturnal PAP therapy   T2DM (type 2 diabetes mellitus) (HCC)     Past Surgical History:  Procedure Laterality Date   CARDIAC CATHETERIZATION Left 11/15/2005   Procedure: CARDIAC CATHETERIZATION; Location: [facility]; Surgeon: Melanee, MD  COLONOSCOPY  11/30/2013   COLONOSCOPY  05/05/2017   CORONARY ANGIOPLASTY WITH STENT PLACEMENT Left 2009   Procedure: CORONARY ANGIOPLASTY WITH STENT PLACEMENT (2.5 x 12 mm Microdriver BMS to RI)   CORONARY ANGIOPLASTY WITH STENT PLACEMENT Left 02/01/2014   Procedure: CORONARY ANGIOPLASTY WITH STENT PLACEMENT (overlapping 2.25 x 12 mm dLAD and  2.75 x 20 mm pLAD Promus Premier DES): Location: [facility]; Surgeon: Suella Finders, MD   CORONARY ANGIOPLASTY WITH STENT PLACEMENT Left 12/31/2015   Procedure: STAGED CORONARY ANGIOPLASTY WITH STENT PLACEMENT (3.5 x 16 mm pRCA and 2.5 x 32 mm mRCA Promus Premier DES); Location: [facility]; Surgeon: Suella Finders, MD   CORONARY ANGIOPLASTY WITH STENT PLACEMENT  08/31/2018   Procedure: CORONARY ANGIOPLASTY WITH STENT PLACEMENT (2.5 x 30 mm Resolute Onyx DES to RPLB); [facility]: [facility]; Surgeon: Zachary Car, MD   CORONARY ARTERY BYPASS GRAFT  11/18/2004   Procedure: 3v CORONARY ARTERY BYPASS GRAFT; Location: [facility]; Surgeon: Lajoyce Farr, MD)   ERCP  10/06/2021   HERNIA REPAIR Bilateral    LEFT HEART CATH AND CORS/GRAFTS ANGIOGRAPHY Left 12/23/2015   Procedure: LEFT HEART CATH AND CORS/GRAFTS ANGIOGRAPHY; Location: [facility]; Surgeon: Suella Finders, MD   SPINE SURGERY  05/19/2023   UMBILICAL HERNIA REPAIR N/A 10/30/2021   Procedure: HERNIA REPAIR UMBILICAL ADULT;  Surgeon: Jordis Laneta FALCON, MD;  Location: [facility];  Service: General;  Laterality: N/A;     Home Medications:  Prior to Admission medications  Medication Sig Start Date End Date Taking? Authorizing Provider  albuterol  (VENTOLIN  HFA) 108 (90 Base) MCG/ACT inhaler Inhale 2 puffs into the lungs every 6 (six) hours as needed for shortness of breath. 10/07/21  Yes [provider]  aspirin  81 MG EC tablet Take 81 mg by mouth daily. Swallow whole.   Yes [provider]  atorvastatin  (LIPITOR) 40 MG tablet Take 40 mg by mouth at bedtime. 07/23/21  Yes [provider]  clopidogrel  (PLAVIX ) 75 MG tablet Take 75 mg by mouth daily. 10/25/23 11/19/24 Yes [provider]  colchicine  0.6 MG tablet Take 0.6 mg by mouth daily. 11/30/23  Yes [provider]  famotidine  (PEPCID ) 20 MG tablet Take 20 mg by mouth as needed for heartburn or indigestion.   Yes [provider]  furosemide  (LASIX ) 20 MG tablet Take 2 tablets (40 mg total) by  mouth daily for 6 days. 09/01/23 09/17/24 Yes Van Knee, MD  gabapentin  (NEURONTIN ) 300 MG capsule Take 300-600 mg by mouth See admin instructions. Take 300 mg morning and 600 mg at night 07/24/21  Yes [provider]  ibuprofen (ADVIL) 200 MG tablet Take 400 mg by mouth every 6 (six) hours as needed for headache or moderate pain.   Yes [provider]  lisinopril  (ZESTRIL ) 5 MG tablet Take 5 mg by mouth 2 (two) times daily. 04/28/21 09/17/24 Yes [provider]  metFORMIN (GLUCOPHAGE) 1000 MG tablet Take 1,000 mg by mouth at bedtime. 04/22/21 09/17/24 Yes [provider]  methocarbamol (ROBAXIN) 500 MG tablet Take 500 mg by mouth every 8 (eight) hours as needed for muscle spasms.   Yes [provider]  metoprolol  tartrate (LOPRESSOR ) 50 MG tablet Take 50 mg by mouth 2 (two) times daily. 12/09/20 09/17/24 Yes [provider]  morphine  (MS CONTIN ) 15 MG 12 hr tablet Take 15 mg by mouth every 8 (eight) hours.   Yes [provider]  naloxone (NARCAN) nasal spray 4 mg/0.1 mL Place 1 spray into the nose as needed. One spray in either nostril  once for known/suspected overdose. May repeat every 2 to 3 minutes in alternating nostrils until EMS arrives. 04/22/21  Yes [provider]  omeprazole (PRILOSEC) 20 MG capsule Take 20 mg by mouth daily. 11/30/23 11/29/24 Yes [provider]  traZODone  (DESYREL ) 100 MG tablet Take 200 mg by mouth at bedtime. 08/09/24  Yes [provider]  methylPREDNISolone  (MEDROL  DOSEPAK) 4 MG TBPK tablet Take according to the package insert. Patient not taking: Reported on 09/17/2024 05/27/23   Bernardino Ditch, NP  montelukast (SINGULAIR) 10 MG tablet Take 10 mg by mouth at bedtime. Patient not taking: Reported on 09/17/2024 11/30/23 11/29/24  [provider]  Multiple Vitamin (MULTIVITAMIN WITH MINERALS) TABS tablet Take 1 tablet by mouth daily. Patient not taking: Reported on 09/17/2024     [provider]    Scheduled Meds:  amiodarone   150 mg Intravenous Once   apixaban   5 mg Oral BID   atorvastatin   40 mg Oral Daily   colchicine   0.6 mg Oral Daily   doxycycline   100 mg Oral Q12H   gabapentin   300 mg Oral q morning   gabapentin   600 mg Oral QHS   insulin  aspart  0-5 Units Subcutaneous QHS   insulin  aspart  0-9 Units Subcutaneous TID WC   metoprolol  tartrate  50 mg Oral BID   morphine   15 mg Oral Q8H   pantoprazole   40 mg Oral Daily   traZODone   200 mg Oral QHS   Continuous Infusions:  amiodarone      Followed by   amiodarone      cefTRIAXone  (ROCEPHIN )  IV Stopped (09/19/24 1752)   magnesium  sulfate bolus IVPB     potassium PHOSPHATE  IVPB (in mmol)     PRN Meds: acetaminophen  **OR** acetaminophen , famotidine , ipratropium-albuterol , ondansetron  **OR** ondansetron  (ZOFRAN ) IV  Allergies:   Allergies[1]  Social History:   Social History   Socioeconomic History   Marital status: Married    Spouse name: Not on file   Number of children: Not on file   Years of education: Not on file   Highest education level: Not on file  Occupational History   Not on file  Tobacco Use   Smoking status: Former    Current packs/day: 0.00    Average packs/day: 2.0 packs/day for 35.0 years (70.0 ttl pk-yrs)    Types: Cigarettes    Start date: 22    Quit date: 2002    Years since quitting: 24.0    Passive exposure: Never   Smokeless tobacco: Never  Vaping Use   Vaping status: Never Used  Substance and Sexual Activity   Alcohol use: Not Currently   Drug use: Never   Sexual activity: Yes    Partners: Female  Other Topics Concern   Not on file  Social History Narrative   Not on file   Social Drivers of Health   Tobacco Use: Medium Risk (09/17/2024)   Patient History    Smoking Tobacco Use: Former    Smokeless Tobacco Use: Never    Passive Exposure: Never  Physicist, Medical Strain: Low Risk (03/28/2024)   Received from [facility]   Overall  Financial Resource Strain (CARDIA)    How hard is it for you to pay for the very basics like food, housing, medical care, and heating?: Not hard at all  Food Insecurity: No Food Insecurity (09/19/2024)   Epic    Worried About Radiation Protection Practitioner of Food in the Last Year: Never true    Ran Out of  Food in the Last Year: Never true  Transportation Needs: No Transportation Needs (09/19/2024)   Epic    Lack of Transportation (Medical): No    Lack of Transportation (Non-Medical): No  Physical Activity: Not on file  Stress: Not on file  Social Connections: Moderately Isolated (09/19/2024)   Social Connection and Isolation Panel    Frequency of Communication with Friends and Family: More than three times a week    Frequency of Social Gatherings with Friends and Family: More than three times a week    Attends Religious Services: Never    Database Administrator or Organizations: No    Attends Banker Meetings: Never    Marital Status: Married  Catering Manager Violence: Not At Risk (09/19/2024)   Epic    Fear of Current or Ex-Partner: No    Emotionally Abused: No    Physically Abused: No    Sexually Abused: No  Depression (PHQ2-9): Not on file  Alcohol Screen: Not on file  Housing: Low Risk (09/19/2024)   Epic    Unable to Pay for Housing in the Last Year: No    Number of Times Moved in the Last Year: 0    Homeless in the Last Year: No  Utilities: Not At Risk (09/19/2024)   Epic    Threatened with loss of utilities: No  Health Literacy: Low Risk (03/28/2024)   Received from [facility]    How often do you need to have someone help you when you read instructions, pamphlets, or other written material from your doctor or pharmacy?: Never    Family History:   History reviewed. No pertinent family history.   ROS:  Please see the history of present illness.  Review of Systems  Constitutional: Negative.   HENT: Negative.    Respiratory:  Positive for shortness of  breath.   Cardiovascular: Negative.   Gastrointestinal: Negative.   Musculoskeletal: Negative.   Neurological: Negative.   Psychiatric/Behavioral: Negative.    All other systems reviewed and are negative.   Physical Exam/Data: Vitals:   09/20/24 0424 09/20/24 0851 09/20/24 1248 09/20/24 1602  BP:  (!) 146/59 139/73 137/85  Pulse:  97 96 (!) 108  Resp: (!) 22 16 16 18   Temp:  97.9 F (36.6 C) 98.5 F (36.9 C) 97.6 F (36.4 C)  TempSrc:    Oral  SpO2: 92% 92% 95% 92%  Weight:      Height:        Intake/Output Summary (Last 24 hours) at 09/20/2024 1608 Last data filed at 09/20/2024 1300 Gross per 24 hour  Intake 710 ml  Output --  Net 710 ml      09/19/2024   10:08 PM 09/18/2024    3:22 AM 09/01/2023    4:33 PM  Last 3 Weights  Weight (lbs) 175 lb 11.3 oz 176 lb 170 lb  Weight (kg) 79.7 kg 79.833 kg 77.111 kg     Body mass index is 25.21 kg/m.  General:  Well nourished, well developed, in no acute distress HEENT: normal Neck: no JVD Vascular: No carotid bruits; Distal pulses 2+ bilaterally Cardiac: Rapid, irregularly irregular; no murmur  Lungs: Coarse breath sounds bilaterally Abd: soft, nontender, no hepatomegaly  Ext: no edema Musculoskeletal:  No deformities, BUE and BLE strength normal and equal Skin: warm and dry  Neuro:  CNs 2-12 intact, no focal abnormalities noted Psych:  Normal affect   EKG:  The EKG was personally reviewed and  demonstrates: Atrial fibs/flutter ventricular rate 111 bpm nonspecific ST abnormality, T wave abnormality inferior leads, consider ischemia  Telemetry:  Telemetry was personally reviewed and demonstrates: Atrial fibrillation/flutter rate 120 up to 140 bpm  Relevant CV Studies: Echo pending  Laboratory Data: High Sensitivity Troponin:  No results for input(s): TROPONINIHS in the last 720 hours. No results for input(s): TRNPT in the last 720 hours.    Chemistry Recent Labs  Lab 09/17/24 1517 09/18/24 0417 09/19/24 0441  09/20/24 0517 09/20/24 1306  NA 140  --  142 140  --   K 4.1  --  3.7 3.7  --   CL 101  --  107 106  --   CO2 29  --  24 21*  --   GLUCOSE 169*  --  111* 111*  --   BUN 13  --  11 9  --   CREATININE 1.35*  --  1.01 1.04  --   CALCIUM  9.2  --  9.1 9.4  --   MG  --  1.0* 1.9  --  1.5*  GFRNONAA 53*  --  >60 >60  --   ANIONGAP 11  --  12 13  --     Recent Labs  Lab 09/17/24 1517  PROT 7.3  ALBUMIN  4.6  AST 27  ALT 14  ALKPHOS 79  BILITOT 1.1   Lipids No results for input(s): CHOL, TRIG, HDL, LABVLDL, LDLCALC, CHOLHDL in the last 168 hours.  Hematology Recent Labs  Lab 09/17/24 1517 09/19/24 0441 09/20/24 0517  WBC 15.0* 7.9 7.9  RBC 4.63 4.15* 4.32  HGB 14.1 12.7* 13.2  HCT 42.0 37.8* 39.4  MCV 90.7 91.1 91.2  MCH 30.5 30.6 30.6  MCHC 33.6 33.6 33.5  RDW 12.9 12.8 12.5  PLT 169 149* 164   Thyroid   Recent Labs  Lab 09/20/24 1306  TSH 1.060    BNPNo results for input(s): BNP, PROBNP in the last 168 hours.  DDimer No results for input(s): DDIMER in the last 168 hours.  Radiology/Studies:  DG Chest Port 1 View Result Date: 09/17/2024 CLINICAL DATA:  Sepsis, altered level of consciousness EXAM: PORTABLE CHEST 1 VIEW COMPARISON:  08/03/2022 FINDINGS: Single frontal view of the chest demonstrates stable enlargement of the cardiac silhouette. There is new opacification at the right lung base, consistent with right basilar pneumonia. No effusion or pneumothorax. No acute bony abnormalities. IMPRESSION: 1. Right basilar opacification consistent with pneumonia. Followup PA and lateral chest X-ray is recommended in 3-4 weeks following trial of antibiotic therapy to ensure resolution and exclude underlying malignancy. 2. Stable enlarged cardiac silhouette. Electronically Signed   By: Ozell Daring M.D.   On: 09/17/2024 16:32     Assessment and Plan: Atrial fibrillation/flutter with RVR Acute onset 9 AM in the setting of pneumonia, cardiomyopathy -  Relatively asymptomatic - Given low blood pressure, poorly controlled rate, acute onset this morning will recommend we start amiodarone  bolus with infusion  - Will start metoprolol   tartrate 50 twice daily with plan to transition to metoprolol  succinate - Eliquis  5 twice daily - Echo pending - Consider cardioversion if unable to control ventricular rate  2).  Cardiomyopathy Prior ejection fraction 45 to 50% in December 2022 -Repeat echo pending - Appears relatively euvolemic - Started on metoprolol  to tartrate with plan to transition to metoprolol  succinate after adequate rate control - Would likely benefit from restoring normal sinus rhythm.  Could consider cardioversion  3) Right-sided pneumonia/sepsis Presenting with fever, delirium, chest x-ray  with pneumonia, hypoxia -Symptoms improving on broad-spectrum antibiotics, mentation improved     For questions or updates, please contact [facility] Please consult [email or web address] for contact info under   Signed, Keyly Baldonado, MD  09/20/2024 4:08 PM     [1]  Allergies Allergen Reactions   Ativan  [Lorazepam ] Other (See Comments)    Causes AMS   Nitroglycerin Other (See Comments)    Blood pressure too low   Zolpidem     Weird behavior   "

## 2024-09-20 NOTE — Plan of Care (Signed)

## 2024-09-20 NOTE — Telephone Encounter (Addendum)
 Pharmacy Patient Advocate Encounter  Insurance verification completed.    The patient is insured through [employer]/ [employer]. Patient has Medicare and is not eligible for a copay card, but may be able to apply for patient assistance or Medicare RX Payment Plan (Patient Must reach out to their plan, if eligible for payment plan), if available.    Ran test claim for Eliquis  5mg  tablet and the current 30 day co-pay is $49.73.  Ran test claim for Xarelto 20mg  tablet and the current 30 day co-pay is $41.33.  This test claim was processed through [facility]- copay amounts may vary at other pharmacies due to pharmacy/plan contracts, or as the patient moves through the different stages of their insurance plan.

## 2024-09-20 NOTE — Care Management Important Message (Signed)
 Important Message  Patient Details  Name: Clifford Martin MRN: [id number] Date of Birth: 1940-11-05   Important Message Given:  Yes - Medicare IM     Andre Gallego W, CMA 09/20/2024, 11:46 AM

## 2024-09-20 NOTE — TOC CM/SW Note (Signed)
 Transition of Care [facility]) - Inpatient Brief Assessment   Patient Details  Name: Clifford Martin MRN: [id number] Date of Birth: 12-12-1940  Transition of Care [facility]) CM/SW Contact:    Nathanael CHRISTELLA Ring, RN Phone Number: 09/20/2024, 12:20 PM   Clinical Narrative: CM met with patient at the bedside, his wife and son are also present.  Introduced self and explained role in DC planning.  From home with wife, independent and drives.  Current with PCP.  No TOC needs identified.    Transition of Care Asessment: Insurance and Status: Insurance coverage has been reviewed Patient has primary care physician: Yes Home environment has been reviewed: From home with Wife Prior level of function:: Independent Prior/Current Home Services: No current home services Social Drivers of Health Review: SDOH reviewed no interventions necessary Readmission risk has been reviewed: Yes Transition of care needs: no transition of care needs at this time

## 2024-09-20 NOTE — Progress Notes (Signed)
 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  Patient was in his normal state of health earlier in the morning prior to presentation, but then was noted to be altered.  On EMSs arrival he was noted to be febrile to 102 F.  On arrival he was disoriented and febrile to 102.4 F, BP 105/73, with RR 33, HR 125, and hypoxic to 86% requiring 2 L O2 with improvement to 91%.  CBC was notable for leukocytosis to 15.  Lactic acid normal.  4 Plex RVP negative.  CXR showed right basilar opacification consistent with pneumonia.  Patient was given broad-spectrum antibiotics and IVF bolus.  Ultimately he was admitted for severe sepsis and acute hypoxic respite failure in the setting of right-sided pneumonia  Assessment and Plan:  Severe sepsis - resolved - Met criteria w/ fever to 102.4, tachycardia to 125, tachypnea to 33, leukocytosis to 15, with acute respiratory failure and altered mental status in the setting of right sided pneumonia - continue management of pneumonia as below   Right-sided pneumonia:  - Afebrile, leukocytosis resolved - Continue IV rocephin , will switch from IV to PO doxycycline  today - Bronchodilators & encourage incentive spirometry   Afib/flutter - Notified by telemetery that patient is in Afib/flutter with RVR sustained > 140 with max of 162 bpm for just over 1 minute - EKG showed Aflutter at 111 bpm - Cardiology consulted - noted patient in Afib since earlier in the morning,  recommended resuming home metoprolol , starting amiodarone  drip, and Eliquis  - Transfer to progressive level care ordered  Acute metabolic encephalopathy - resolved - Likely in setting of pneumonia as above - High risk for hospital delirium. Delirium precautions - Patient is AAOx3.    Acute hypoxic respiratory failure - resolved - Presented with hypoxia to 86% with SOB and tachypnea, with improvement to 91% on 2L [place] - Per RN documentation, desatted to 66% on RA while ambulating without O2 on 1/13 AM, with improvement to 95% on 4 L - Was maintained on room air the majority of 1/14 with appropriate SpO2 - Will obtain ambulatory SpO2  Hypertension:  - BP labile, but appears to be more consistent elevated - If persists in this pattern today, will resume home antihypertensive   HLD - Continue home statin   DM2:  - Well controlled, HbA1c 5.5.  - Continue on SSI 0-9 AC and 05- HSw/ accuchecks    Hypomagnesemia - resolved   Chronic pain:  - Review of PDMP shows recent fill of Morphine  ER 15 mg 90 quantity for 30 day supply on 08/17/2024 - Will change current order of Morphine  IR to Morphine  ER at 15 mg TID   Scheduled Meds:  aspirin  EC  81 mg Oral Daily   atorvastatin   40 mg Oral Daily   clopidogrel   75 mg Oral Daily   colchicine   0.6 mg Oral Daily   gabapentin   300 mg Oral q morning   gabapentin   600 mg Oral QHS   heparin   5,000 Units Subcutaneous Q8H   insulin  aspart  0-5 Units Subcutaneous QHS   insulin  aspart  0-9 Units Subcutaneous TID WC   morphine   15 mg Oral Q8H   pantoprazole   40 mg Oral Daily   traZODone   200 mg Oral  QHS   Continuous Infusions:  cefTRIAXone  (ROCEPHIN )  IV Stopped (09/19/24 1752)   doxycycline  (VIBRAMYCIN ) IV 100 mg (09/20/24 0509)   PRN Meds:.acetaminophen  **OR** acetaminophen , famotidine , ipratropium-albuterol , ondansetron  **OR** ondansetron  (ZOFRAN ) IV  Current Outpatient Medications  Medication Instructions   albuterol  (VENTOLIN  HFA) 108 (90 Base) MCG/ACT inhaler 2 puffs, Every 6 hours PRN   aspirin  EC 81 mg, Daily   atorvastatin  (LIPITOR) 40 mg, Nightly   clopidogrel  (PLAVIX ) 75 mg, Oral, Daily   colchicine  0.6 mg, Oral, Daily   famotidine  (PEPCID ) 20 mg, As needed   furosemide  (LASIX ) 40 mg, Oral, Daily   gabapentin   (NEURONTIN ) 300-600 mg, See admin instructions   ibuprofen (ADVIL) 400 mg, Every 6 hours PRN   lisinopril  (ZESTRIL ) 5 mg, 2 times daily   metFORMIN (GLUCOPHAGE) 1,000 mg, Nightly   methocarbamol (ROBAXIN) 500 mg, Every 8 hours PRN   methylPREDNISolone  (MEDROL  DOSEPAK) 4 MG TBPK tablet Take according to the package insert.   metoprolol  tartrate (LOPRESSOR ) 50 mg, 2 times daily   montelukast (SINGULAIR) 10 mg, Daily at bedtime   morphine  (MSIR) 15 mg, Oral, Every 8 hours   Multiple Vitamin (MULTIVITAMIN WITH MINERALS) TABS tablet 1 tablet, Daily   naloxone (NARCAN) nasal spray 4 mg/0.1 mL 1 spray, As needed   omeprazole (PRILOSEC) 20 mg, Oral, Daily   traZODone  (DESYREL ) 200 mg, Oral, Daily at bedtime    DVT prophylaxis: heparin  injection 5,000 Units Start: 09/17/24 2200   Code Status:   Code Status: Full Code  Family Communication: Discussed with wife at bedside  Disposition Plan: Likely home pending improvement clinically PT -   -   OT -   -    Level of care: Progressive  Consultants:  Cardiology  Procedures:  None  Antimicrobials: IV Rocephin  and PO doxy   Subjective: NAEO. Seen at bedside. Wife was also at bedside. Still feels short of breath but overall says he is doing much better than when he arrived.   Objective: Vitals:   09/19/24 2208 09/19/24 2351 09/20/24 0418 09/20/24 0424  BP:  (!) 122/58 123/61   Pulse:  94 89   Resp:  16 16 (!) 22  Temp:  98.5 F (36.9 C) 99.6 F (37.6 C)   TempSrc:  Oral Oral   SpO2:  91% (!) 86% 92%  Weight: 79.7 kg     Height: 5' 10 (1.778 m)       Intake/Output Summary (Last 24 hours) at 09/20/2024 0709 Last data filed at 09/19/2024 2208 Gross per 24 hour  Intake 840 ml  Output --  Net 840 ml   Filed Weights   09/18/24 0322 09/19/24 2208  Weight: 79.8 kg 79.7 kg    Examination:  Gen: NAD, A&Ox3 Neck: Supple CV: tachycardic, unable to determine rhythm given tachycardia, no murmurs Resp: normal WOB, CTAB, no  w/r/r Abd: Soft, NTND, no guarding Ext: No LE edema Skin: Warm, dry Neuro: No focal deficits Psych: Calm, cooperative, appropriate affect    Data Reviewed: I have personally reviewed following labs and imaging studies  CBC: Recent Labs  Lab 09/17/24 1517 09/19/24 0441 09/20/24 0517  WBC 15.0* 7.9 7.9  NEUTROABS 13.4*  --   --   HGB 14.1 12.7* 13.2  HCT 42.0 37.8* 39.4  MCV 90.7 91.1 91.2  PLT 169 149* 164   Basic Metabolic Panel: Recent Labs  Lab 09/17/24 1517 09/18/24 0417 09/19/24 0441  NA 140  --  142  K 4.1  --  3.7  CL 101  --  107  CO2 29  --  24  GLUCOSE 169*  --  111*  BUN 13  --  11  CREATININE 1.35*  --  1.01  CALCIUM  9.2  --  9.1  MG  --  1.0* 1.9   GFR: Estimated Creatinine Clearance: 59.2 mL/min (by C-G formula based on SCr of 1.01 mg/dL). Liver Function Tests: Recent Labs  Lab 09/17/24 1517  AST 27  ALT 14  ALKPHOS 79  BILITOT 1.1  PROT 7.3  ALBUMIN  4.6   No results for input(s): LIPASE, AMYLASE in the last 168 hours. No results for input(s): AMMONIA in the last 168 hours. Coagulation Profile: Recent Labs  Lab 09/17/24 1517 09/18/24 0417  INR 1.1 1.3*   Cardiac Enzymes: No results for input(s): CKTOTAL, CKMB, CKMBINDEX, TROPONINI in the last 168 hours. BNP (last 3 results) No results for input(s): PROBNP in the last 8760 hours. HbA1C: Recent Labs    09/17/24 1517  HGBA1C 5.5   CBG: Recent Labs  Lab 09/18/24 2203 09/19/24 0751 09/19/24 1205 09/19/24 1649 09/19/24 2111  GLUCAP 127* 111* 150* 167* 112*   Lipid Profile: No results for input(s): CHOL, HDL, LDLCALC, TRIG, CHOLHDL, LDLDIRECT in the last 72 hours. Thyroid  Function Tests: No results for input(s): TSH, T4TOTAL, FREET4, T3FREE, THYROIDAB in the last 72 hours. Anemia Panel: No results for input(s): VITAMINB12, FOLATE, FERRITIN, TIBC, IRON, RETICCTPCT in the last 72 hours. Sepsis Labs: Recent Labs  Lab  09/17/24 1517 09/18/24 0417  LATICACIDVEN 1.4 0.7    Recent Results (from the past 240 hours)  Resp panel by RT-PCR (RSV, Flu A&B, Covid) Anterior Nasal Swab     Status: None   Collection Time: 09/17/24  3:17 PM   Specimen: Anterior Nasal Swab  Result Value Ref Range Status   SARS Coronavirus 2 by RT PCR NEGATIVE NEGATIVE Final    Comment: (NOTE) SARS-CoV-2 target nucleic acids are NOT DETECTED.  The SARS-CoV-2 RNA is generally detectable in upper respiratory specimens during the acute phase of infection. The lowest concentration of SARS-CoV-2 viral copies this assay can detect is 138 copies/mL. A negative result does not preclude SARS-Cov-2 infection and should not be used as the sole basis for treatment or other patient management decisions. A negative result may occur with  improper specimen collection/handling, submission of specimen other than nasopharyngeal swab, presence of viral mutation(s) within the areas targeted by this assay, and inadequate number of viral copies(<138 copies/mL). A negative result must be combined with clinical observations, patient history, and epidemiological information. The expected result is Negative.  Fact Sheet for Patients:  bloggercourse.com  Fact Sheet for Healthcare Providers:  seriousbroker.it  This test is no t yet approved or cleared by the [place]  FDA and  has been authorized for detection and/or diagnosis of SARS-CoV-2 by FDA under an Emergency Use Authorization (EUA). This EUA will remain  in effect (meaning this test can be used) for the duration of the COVID-19 declaration under Section 564(b)(1) of the Act, 21 U.S.C.section 360bbb-3(b)(1), unless the authorization is terminated  or revoked sooner.       Influenza A by PCR NEGATIVE NEGATIVE Final   Influenza B by PCR NEGATIVE NEGATIVE Final    Comment: (NOTE) The Xpert Xpress SARS-CoV-2/FLU/RSV plus assay is intended  as an aid in the diagnosis of influenza from Nasopharyngeal swab specimens and should not be used as a sole basis for treatment. Nasal washings and aspirates are unacceptable for Xpert Xpress SARS-CoV-2/FLU/RSV testing.  Fact Sheet for Patients:  bloggercourse.com  Fact Sheet for Healthcare Providers: seriousbroker.it  This test is not yet approved or cleared by the [place]  FDA and has been authorized for detection and/or diagnosis of SARS-CoV-2 by FDA under an Emergency Use Authorization (EUA). This EUA will remain in effect (meaning this test can be used) for the duration of the COVID-19 declaration under Section 564(b)(1) of the Act, 21 U.S.C. section 360bbb-3(b)(1), unless the authorization is terminated or revoked.     Resp Syncytial Virus by PCR NEGATIVE NEGATIVE Final    Comment: (NOTE) Fact Sheet for Patients: bloggercourse.com  Fact Sheet for Healthcare Providers: seriousbroker.it  This test is not yet approved or cleared by the [place]  FDA and has been authorized for detection and/or diagnosis of SARS-CoV-2 by FDA under an Emergency Use Authorization (EUA). This EUA will remain in effect (meaning this test can be used) for the duration of the COVID-19 declaration under Section 564(b)(1) of the Act, 21 U.S.C. section 360bbb-3(b)(1), unless the authorization is terminated or revoked.  Performed at [facility], [address] [place], [place] [address]   Blood Culture (routine x 2)     Status: None (Preliminary result)   Collection Time: 09/17/24  3:17 PM   Specimen: BLOOD LEFT FOREARM  Result Value Ref Range Status   Specimen Description BLOOD LEFT FOREARM  Final   Special Requests   Final    BOTTLES DRAWN AEROBIC AND ANAEROBIC Blood Culture adequate volume   Culture   Final    NO GROWTH 2 DAYS Performed at [facility], [address], [place], [place] [address]    Report Status PENDING  Incomplete  Blood Culture (routine x 2)     Status: None (Preliminary result)   Collection Time: 09/17/24  3:21 PM   Specimen: Right Antecubital; Blood  Result Value Ref Range Status   Specimen Description RIGHT ANTECUBITAL  Final   Special Requests   Final    BOTTLES DRAWN AEROBIC AND ANAEROBIC Blood Culture results may not be optimal due to an inadequate volume of blood received in culture bottles   Culture   Final    NO GROWTH 2 DAYS Performed at [facility], [address] [place], [place] [address]    Report Status PENDING  Incomplete     Radiology Studies: No results found.   Scheduled Meds:  aspirin  EC  81 mg Oral Daily   atorvastatin   40 mg Oral Daily   clopidogrel   75 mg Oral Daily   colchicine   0.6 mg Oral Daily   gabapentin   300 mg Oral q morning   gabapentin   600 mg Oral QHS   heparin   5,000 Units Subcutaneous Q8H   insulin  aspart  0-5 Units Subcutaneous QHS   insulin  aspart  0-9 Units Subcutaneous TID WC   morphine   15 mg Oral Q8H   pantoprazole   40 mg Oral Daily   traZODone   200 mg Oral QHS   Continuous Infusions:  cefTRIAXone  (ROCEPHIN )  IV Stopped (09/19/24 1752)   doxycycline  (VIBRAMYCIN ) IV 100 mg (09/20/24 0509)     Unresulted Labs (From admission, onward)     Start     Ordered   09/21/24 0500  CBC  Tomorrow morning,   R        09/20/24 1250   09/21/24 0500  Basic metabolic panel with GFR  Tomorrow morning,   R        09/20/24 1250   09/20/24 1251  Magnesium   Add-on,  AD        09/20/24 1250   09/20/24 1251  Phosphorus  Add-on,   AD        09/20/24 1250   09/20/24 1219  TSH  Once,   R        09/20/24 1220             LOS:  LOS: 3 days   Time Spent: 60 minutes  Charlie Seda Al-Sultani, MD [facility]  If 7PM-7AM, please contact night-coverage  09/20/2024, 7:09 AM      "

## 2024-09-21 ENCOUNTER — Inpatient Hospital Stay

## 2024-09-21 ENCOUNTER — Inpatient Hospital Stay
Admit: 2024-09-21 | Discharge: 2024-09-21 | Disposition: A | Discharge status: Home / Self Care | Attending: Nurse Practitioner | Admitting: Nurse Practitioner

## 2024-09-21 DIAGNOSIS — I480 Paroxysmal atrial fibrillation: Secondary | ICD-10-CM

## 2024-09-21 DIAGNOSIS — I4891 Unspecified atrial fibrillation: Secondary | ICD-10-CM

## 2024-09-21 DIAGNOSIS — I5020 Unspecified systolic (congestive) heart failure: Secondary | ICD-10-CM | POA: Diagnosis not present

## 2024-09-21 DIAGNOSIS — J189 Pneumonia, unspecified organism: Secondary | ICD-10-CM | POA: Diagnosis not present

## 2024-09-21 DIAGNOSIS — I4892 Unspecified atrial flutter: Secondary | ICD-10-CM | POA: Diagnosis not present

## 2024-09-21 DIAGNOSIS — I5042 Chronic combined systolic (congestive) and diastolic (congestive) heart failure: Secondary | ICD-10-CM | POA: Diagnosis not present

## 2024-09-21 DIAGNOSIS — A419 Sepsis, unspecified organism: Secondary | ICD-10-CM | POA: Diagnosis not present

## 2024-09-21 LAB — CBC
HCT: 33 % — ABNORMAL LOW (ref 39.0–52.0)
Hemoglobin: 11 g/dL — ABNORMAL LOW (ref 13.0–17.0)
MCH: 30.6 pg (ref 26.0–34.0)
MCHC: 33.3 g/dL (ref 30.0–36.0)
MCV: 91.9 fL (ref 80.0–100.0)
Platelets: 155 K/uL (ref 150–400)
RBC: 3.59 MIL/uL — ABNORMAL LOW (ref 4.22–5.81)
RDW: 12.6 % (ref 11.5–15.5)
WBC: 6.7 K/uL (ref 4.0–10.5)
nRBC: 0 % (ref 0.0–0.2)

## 2024-09-21 LAB — ECHOCARDIOGRAM COMPLETE
Area-P 1/2: 5.42 cm2
Height: 70 in
MV M vel: 5.45 m/s
MV Peak grad: 118.8 mmHg
S' Lateral: 5.1 cm
Weight: 2811.31 [oz_av]

## 2024-09-21 LAB — BASIC METABOLIC PANEL WITH GFR
Anion gap: 8 (ref 5–15)
BUN: 11 mg/dL (ref 8–23)
CO2: 25 mmol/L (ref 22–32)
Calcium: 8.7 mg/dL — ABNORMAL LOW (ref 8.9–10.3)
Chloride: 106 mmol/L (ref 98–111)
Creatinine, Ser: 0.99 mg/dL (ref 0.61–1.24)
GFR, Estimated: >60 mL/min
Glucose, Bld: 138 mg/dL — ABNORMAL HIGH (ref 70–99)
Potassium: 4.5 mmol/L (ref 3.5–5.1)
Sodium: 139 mmol/L (ref 135–145)

## 2024-09-21 LAB — MAGNESIUM: Magnesium: 1.6 mg/dL — ABNORMAL LOW (ref 1.7–2.4)

## 2024-09-21 LAB — GLUCOSE, CAPILLARY
Glucose-Capillary: 118 mg/dL — ABNORMAL HIGH (ref 70–99)
Glucose-Capillary: 148 mg/dL — ABNORMAL HIGH (ref 70–99)
Glucose-Capillary: 131 mg/dL — ABNORMAL HIGH (ref 70–99)
Glucose-Capillary: 143 mg/dL — ABNORMAL HIGH (ref 70–99)

## 2024-09-21 MED ORDER — MAGNESIUM SULFATE 2 GM/50ML IV SOLN
2.0000 g | Freq: Once | INTRAVENOUS | Status: AC
  Administered 2024-09-21: 2 g via INTRAVENOUS
  Filled 2024-09-21: qty 50

## 2024-09-21 MED ORDER — DAPAGLIFLOZIN PROPANEDIOL 10 MG PO TABS
10.0000 mg | ORAL_TABLET | Freq: Every day | ORAL | Status: DC
  Administered 2024-09-21 – 2024-09-22 (×2): 10 mg via ORAL
  Filled 2024-09-21 (×2): qty 1

## 2024-09-21 MED ORDER — AMIODARONE HCL 200 MG PO TABS
400.0000 mg | ORAL_TABLET | Freq: Two times a day (BID) | ORAL | Status: DC
  Administered 2024-09-21 – 2024-09-22 (×2): 400 mg via ORAL
  Filled 2024-09-21 (×2): qty 2

## 2024-09-21 MED ORDER — PERFLUTREN LIPID MICROSPHERE
1.0000 mL | INTRAVENOUS | Status: AC | PRN
  Administered 2024-09-21: 3 mL via INTRAVENOUS

## 2024-09-21 MED ORDER — FUROSEMIDE 40 MG PO TABS
40.0000 mg | ORAL_TABLET | Freq: Every day | ORAL | Status: DC
  Administered 2024-09-21 – 2024-09-22 (×2): 40 mg via ORAL
  Filled 2024-09-21 (×2): qty 1

## 2024-09-21 MED ORDER — AMIODARONE HCL 200 MG PO TABS: 200.0000 mg | ORAL_TABLET | Freq: Every day | ORAL | Status: DC

## 2024-09-21 MED ORDER — METOPROLOL SUCCINATE ER 100 MG PO TB24
100.0000 mg | ORAL_TABLET | Freq: Every day | ORAL | Status: DC
  Administered 2024-09-22: 100 mg via ORAL
  Filled 2024-09-21: qty 1

## 2024-09-21 NOTE — Progress Notes (Signed)
 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  Patient was in his normal state of health earlier in the morning prior to presentation, but then was noted to be altered.  On EMSs arrival he was noted to be febrile to 102 F.  On arrival he was disoriented and febrile to 102.4 F, BP 105/73, with RR 33, HR 125, and hypoxic to 86% requiring 2 L O2 with improvement to 91%.  CBC was notable for leukocytosis to 15.  Lactic acid normal.  4 Plex RVP negative.  CXR showed right basilar opacification consistent with pneumonia.  Patient was given broad-spectrum antibiotics and IVF bolus.  Ultimately he was admitted for severe sepsis and acute hypoxic respite failure in the setting of right-sided pneumonia  Assessment and Plan:  Severe sepsis - resolved - Met criteria w/ fever to 102.4, tachycardia to 125, tachypnea to 33, leukocytosis to 15, with acute respiratory failure and altered mental status in the setting of right sided pneumonia - continue management of pneumonia as below   Right-sided pneumonia:  - Afebrile, leukocytosis resolved - Continue IV rocephin  and PO doxycycline   - Bronchodilators & encourage incentive spirometry   Acute hypoxic respiratory - Desated to 86% on RA last evening while exerting himself getting in bed. Was placed on 4L [place] with improvement to 94% - Wean O2 as tolerated - Goal SpO2 > 92%  Afib/flutter - Notified by telemetery on 1/15 that patient is in Afib/flutter with RVR sustained > 140 with max of 162 bpm for just over 1 minute - EKG showed Aflutter at 111 bpm - Cardiology following - switching to PO amiodarone . Switch to metoprolol  succinate 100 mg tomorrow  Acute metabolic encephalopathy - resolved - Likely in setting of pneumonia as above -  High risk for hospital delirium. Delirium precautions - Patient is AAOx3.   Acute hypoxic respiratory failure - resolved - Presented with hypoxia to 86% with SOB and tachypnea, with improvement to 91% on 2L  - Per RN documentation, desatted to 66% on RA while ambulating without O2 on 1/13 AM, with improvement to 95% on 4 L - Was maintained on room air the majority of 1/14 with appropriate SpO2 - Will obtain ambulatory SpO2  HFrEF ICM - Echo showed LVEF 30-35% (down from 45-50% 08/2021) - Cardiology following  - Home Lasix  40 mg resumed  Hypertension:  - BP labile, but appears to be more consistent elevated - If persists in this pattern today, will resume home antihypertensive   HLD - Continue home statin   DM2:  - Well controlled, HbA1c 5.5.  - Continue on SSI 0-9 AC and 05- HSw/ accuchecks    Hypomagnesemia - resolved   Chronic pain:  - Review of PDMP shows recent fill of Morphine  ER 15 mg 90 quantity for 30 day supply on 08/17/2024 - Will change current order of Morphine  IR to Morphine  ER at 15 mg TID   Scheduled Meds:  apixaban   5 mg Oral BID   atorvastatin   40 mg Oral Daily   colchicine   0.6 mg Oral Daily   doxycycline   100 mg Oral Q12H   gabapentin   300 mg Oral q morning   gabapentin   600 mg Oral QHS   insulin  aspart  0-5 Units Subcutaneous QHS  insulin  aspart  0-9 Units Subcutaneous TID WC   metoprolol  tartrate  50 mg Oral BID   morphine   15 mg Oral Q8H   pantoprazole   40 mg Oral Daily   traZODone   200 mg Oral QHS   Continuous Infusions:  amiodarone  30 mg/hr (09/21/24 0534)   cefTRIAXone  (ROCEPHIN )  IV 2 g (09/20/24 2240)   PRN Meds:.acetaminophen  **OR** acetaminophen , famotidine , ipratropium-albuterol , ondansetron  **OR** ondansetron  (ZOFRAN ) IV  Current Outpatient Medications  Medication Instructions   albuterol  (VENTOLIN  HFA) 108 (90 Base) MCG/ACT inhaler 2 puffs, Every 6 hours PRN   aspirin  EC 81 mg, Daily   atorvastatin  (LIPITOR) 40 mg, Nightly    clopidogrel  (PLAVIX ) 75 mg, Oral, Daily   colchicine  0.6 mg, Oral, Daily   famotidine  (PEPCID ) 20 mg, As needed   furosemide  (LASIX ) 40 mg, Oral, Daily   gabapentin  (NEURONTIN ) 300-600 mg, See admin instructions   ibuprofen (ADVIL) 400 mg, Every 6 hours PRN   lisinopril  (ZESTRIL ) 5 mg, 2 times daily   metFORMIN (GLUCOPHAGE) 1,000 mg, Nightly   methocarbamol (ROBAXIN) 500 mg, Every 8 hours PRN   methylPREDNISolone  (MEDROL  DOSEPAK) 4 MG TBPK tablet Take according to the package insert.   metoprolol  tartrate (LOPRESSOR ) 50 mg, 2 times daily   montelukast (SINGULAIR) 10 mg, Daily at bedtime   morphine  (MS CONTIN ) 15 mg, Every 8 hours   Multiple Vitamin (MULTIVITAMIN WITH MINERALS) TABS tablet 1 tablet, Daily   naloxone (NARCAN) nasal spray 4 mg/0.1 mL 1 spray, As needed   omeprazole (PRILOSEC) 20 mg, Oral, Daily   traZODone  (DESYREL ) 200 mg, Oral, Daily at bedtime    DVT prophylaxis:  apixaban  (ELIQUIS ) tablet 5 mg   Code Status:   Code Status: Full Code  Family Communication: Discussed with wife at bedside  Disposition Plan: Likely home pending improvement clinically PT -   -   OT -   -    Level of care: Progressive  Consultants:  Cardiology  Procedures:  None  Antimicrobials: IV Rocephin  and PO doxy   Subjective: NAEO. Reports feeling better today, denies any chest pain, states no more palpitations. Gets short of breath getting out of the bed but recovers fine. Was put on 4L [place] overnight after SpO2 dropped to 86%.   Objective: Vitals:   09/20/24 2015 09/20/24 2017 09/21/24 0042 09/21/24 0353  BP:   108/75 119/73  Pulse:   66 68  Resp:   18 18  Temp:   97.9 F (36.6 C) 98.6 F (37 C)  TempSrc:      SpO2: (!) 86% 94% 98% 99%  Weight:      Height:        Intake/Output Summary (Last 24 hours) at 09/21/2024 0923 Last data filed at 09/21/2024 0300 Gross per 24 hour  Intake 623.92 ml  Output 225 ml  Net 398.92 ml   Filed Weights   09/18/24 0322 09/19/24 2208   Weight: 79.8 kg 79.7 kg    Examination:  Gen: NAD, A&Ox3 Neck: Supple CV: RRR, no murmurs appreciates Resp: normal WOB, bibasilar crackles, [place] in place Abd: Soft, NTND, no guarding Ext: Trace LE edema Skin: Warm, dry Neuro: No focal deficits Psych: Calm, cooperative, appropriate affect    Data Reviewed: I have personally reviewed following labs and imaging studies  CBC: Recent Labs  Lab 09/17/24 1517 09/19/24 0441 09/20/24 0517 09/21/24 0432  WBC 15.0* 7.9 7.9 6.7  NEUTROABS 13.4*  --   --   --   HGB 14.1 12.7* 13.2 11.0*  HCT 42.0 37.8* 39.4 33.0*  MCV 90.7 91.1 91.2 91.9  PLT 169 149* 164 155   Basic Metabolic Panel: Recent Labs  Lab 09/17/24 1517 09/18/24 0417 09/19/24 0441 09/20/24 0517 09/20/24 1306 09/21/24 0432  NA 140  --  142 140  --  139  K 4.1  --  3.7 3.7  --  4.5  CL 101  --  107 106  --  106  CO2 29  --  24 21*  --  25  GLUCOSE 169*  --  111* 111*  --  138*  BUN 13  --  11 9  --  11  CREATININE 1.35*  --  1.01 1.04  --  0.99  CALCIUM  9.2  --  9.1 9.4  --  8.7*  MG  --  1.0* 1.9  --  1.5*  --   PHOS  --   --   --   --  2.3*  --    GFR: Estimated Creatinine Clearance: 60.4 mL/min (by C-G formula based on SCr of 0.99 mg/dL). Liver Function Tests: Recent Labs  Lab 09/17/24 1517  AST 27  ALT 14  ALKPHOS 79  BILITOT 1.1  PROT 7.3  ALBUMIN  4.6   No results for input(s): LIPASE, AMYLASE in the last 168 hours. No results for input(s): AMMONIA in the last 168 hours. Coagulation Profile: Recent Labs  Lab 09/17/24 1517 09/18/24 0417  INR 1.1 1.3*   Cardiac Enzymes: No results for input(s): CKTOTAL, CKMB, CKMBINDEX, TROPONINI in the last 168 hours. BNP (last 3 results) No results for input(s): PROBNP in the last 8760 hours. HbA1C: No results for input(s): HGBA1C in the last 72 hours.  CBG: Recent Labs  Lab 09/20/24 0850 09/20/24 1253 09/20/24 1608 09/20/24 2155 09/21/24 0837  GLUCAP 138* 184* 132* 139* 118*    Lipid Profile: No results for input(s): CHOL, HDL, LDLCALC, TRIG, CHOLHDL, LDLDIRECT in the last 72 hours. Thyroid  Function Tests: Recent Labs    09/20/24 1306  TSH 1.060   Anemia Panel: No results for input(s): VITAMINB12, FOLATE, FERRITIN, TIBC, IRON, RETICCTPCT in the last 72 hours. Sepsis Labs: Recent Labs  Lab 09/17/24 1517 09/18/24 0417  LATICACIDVEN 1.4 0.7    Recent Results (from the past 240 hours)  Resp panel by RT-PCR (RSV, Flu A&B, Covid) Anterior Nasal Swab     Status: None   Collection Time: 09/17/24  3:17 PM   Specimen: Anterior Nasal Swab  Result Value Ref Range Status   SARS Coronavirus 2 by RT PCR NEGATIVE NEGATIVE Final    Comment: (NOTE) SARS-CoV-2 target nucleic acids are NOT DETECTED.  The SARS-CoV-2 RNA is generally detectable in upper respiratory specimens during the acute phase of infection. The lowest concentration of SARS-CoV-2 viral copies this assay can detect is 138 copies/mL. A negative result does not preclude SARS-Cov-2 infection and should not be used as the sole basis for treatment or other patient management decisions. A negative result may occur with  improper specimen collection/handling, submission of specimen other than nasopharyngeal swab, presence of viral mutation(s) within the areas targeted by this assay, and inadequate number of viral copies(<138 copies/mL). A negative result must be combined with clinical observations, patient history, and epidemiological information. The expected result is Negative.  Fact Sheet for Patients:  bloggercourse.com  Fact Sheet for Healthcare Providers:  seriousbroker.it  This test is no t yet approved or cleared by the [place]  FDA and  has been authorized for detection and/or diagnosis of SARS-CoV-2 by  FDA under an Emergency Use Authorization (EUA). This EUA will remain  in effect (meaning this test can be  used) for the duration of the COVID-19 declaration under Section 564(b)(1) of the Act, 21 U.S.C.section 360bbb-3(b)(1), unless the authorization is terminated  or revoked sooner.       Influenza A by PCR NEGATIVE NEGATIVE Final   Influenza B by PCR NEGATIVE NEGATIVE Final    Comment: (NOTE) The Xpert Xpress SARS-CoV-2/FLU/RSV plus assay is intended as an aid in the diagnosis of influenza from Nasopharyngeal swab specimens and should not be used as a sole basis for treatment. Nasal washings and aspirates are unacceptable for Xpert Xpress SARS-CoV-2/FLU/RSV testing.  Fact Sheet for Patients: bloggercourse.com  Fact Sheet for Healthcare Providers: seriousbroker.it  This test is not yet approved or cleared by the [place]  FDA and has been authorized for detection and/or diagnosis of SARS-CoV-2 by FDA under an Emergency Use Authorization (EUA). This EUA will remain in effect (meaning this test can be used) for the duration of the COVID-19 declaration under Section 564(b)(1) of the Act, 21 U.S.C. section 360bbb-3(b)(1), unless the authorization is terminated or revoked.     Resp Syncytial Virus by PCR NEGATIVE NEGATIVE Final    Comment: (NOTE) Fact Sheet for Patients: bloggercourse.com  Fact Sheet for Healthcare Providers: seriousbroker.it  This test is not yet approved or cleared by the [place]  FDA and has been authorized for detection and/or diagnosis of SARS-CoV-2 by FDA under an Emergency Use Authorization (EUA). This EUA will remain in effect (meaning this test can be used) for the duration of the COVID-19 declaration under Section 564(b)(1) of the Act, 21 U.S.C. section 360bbb-3(b)(1), unless the authorization is terminated or revoked.  Performed at [facility], [address] [place], [place] [address]   Blood Culture (routine x 2)     Status:  None (Preliminary result)   Collection Time: 09/17/24  3:17 PM   Specimen: BLOOD LEFT FOREARM  Result Value Ref Range Status   Specimen Description BLOOD LEFT FOREARM  Final   Special Requests   Final    BOTTLES DRAWN AEROBIC AND ANAEROBIC Blood Culture adequate volume   Culture   Final    NO GROWTH 4 DAYS Performed at [facility], [address], [place], [place] [address]    Report Status PENDING  Incomplete  Blood Culture (routine x 2)     Status: None (Preliminary result)   Collection Time: 09/17/24  3:21 PM   Specimen: Right Antecubital; Blood  Result Value Ref Range Status   Specimen Description RIGHT ANTECUBITAL  Final   Special Requests   Final    BOTTLES DRAWN AEROBIC AND ANAEROBIC Blood Culture results may not be optimal due to an inadequate volume of blood received in culture bottles   Culture   Final    NO GROWTH 4 DAYS Performed at [facility], [address], [place], [place] [address]    Report Status PENDING  Incomplete     Radiology Studies: No results found.   Scheduled Meds:  apixaban   5 mg Oral BID   atorvastatin   40 mg Oral Daily   colchicine   0.6 mg Oral Daily   doxycycline   100 mg Oral Q12H   gabapentin   300 mg Oral q morning   gabapentin   600 mg Oral QHS   insulin  aspart  0-5 Units Subcutaneous QHS   insulin  aspart  0-9 Units Subcutaneous TID WC   metoprolol  tartrate  50 mg Oral BID  morphine   15 mg Oral Q8H   pantoprazole   40 mg Oral Daily   traZODone   200 mg Oral QHS   Continuous Infusions:  amiodarone  30 mg/hr (09/21/24 0534)   cefTRIAXone  (ROCEPHIN )  IV 2 g (09/20/24 2240)     Unresulted Labs (From admission, onward)     Start     Ordered   09/22/24 0500  CBC  Tomorrow morning,   R        09/21/24 2314   09/22/24 0500  Basic metabolic panel with GFR  Tomorrow morning,   R        09/21/24 2314             LOS:  LOS: 4 days   Time Spent: 60 minutes  Roddy Bellamy Al-Sultani, MD [facility]  If  7PM-7AM, please contact night-coverage  09/21/2024, 9:23 AM      "

## 2024-09-21 NOTE — Consult Note (Addendum)
 "  Cardiology Consultation   Patient ID: Clifford Martin MRN: [id number]; DOB: 10/05/1940  Admit date: 09/17/2024 Date of Consult: 09/21/2024  PCP:  [facility] Providers Cardiologist:New to [facility]  - Physician requesting consult: Dr. Mosie Reason for consult: Atrial fibrillation with RVR  Patient Profile: Clifford Martin is a 84 y.o. 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 is a 84 year old gentleman with history as detailed above, presenting with disorientation, fever, hypoxia saturations 86% requiring nasal cannula oxygen, leukocytosis, chest x-ray with right basilar opacification concerning for pneumonia, started on broad-spectrum antibiotics, IV fluids  Interval history: Converted to sinus overnight.  Otherwise doing okay.  No complaints   Past Medical History:  Diagnosis Date   Anemia    Arthritis    CHF (congestive heart failure) (HCC)    COPD (chronic obstructive pulmonary disease) (HCC)    Coronary artery disease 11/14/2005   a.) NSTEMI 11/14/05 -> LHC 11/15/05: 100% RCA, 100% OM2, 75% RI, 75-95% LAD; consult CVTS. b.) 3v CABG 11/18/2005. c.) NSTEMI 09 -> LHC 90% RI -> 2.5x12 mm Microdriver BMS. d.) LHC 02/01/14: 70% pLAD, 80-90% mLAD -> 2.25x12 mm (dLAD) and 2.75x20 mm (pLAD) Promus DES. e.) LHC 12/31/15: 90% pRCA, CTO mRCA ->3.5x16 mm pRCA and 2.5x32 mm Promus  DES. f.) LHC 08/31/18: 90% RPLB ->2.5x30 mm Res Onxy DES   Dyspnea    GERD (gastroesophageal reflux disease)    History of atrial flutter 11/19/2005   a.) single episode following extubation from CABG --> DCCV x single 200J shock restored NSR.   History of heart artery stent 2009   6 TOTAL --> a.) 2.5 x 12  Microdriver BMS to RI (2009), b.) overlapping 2.25 x 12 mm (dLAD) and 2.75 x 20 mm (pLAD) Promus Premier DES (02/01/2014). c.) 3.5 x 16 mm (PRCA) and 2.5 x 32 mm(mRCA) Promus Premier (12/31/2015). d.) 2.5 x 30 mm Resolute Onxy to RPLB (08/31/2018)   Hyperlipidemia    Hypertension    Incomplete right bundle branch block (RBBB)    Ischemic cardiomyopathy    Long term current use of antithrombotics/antiplatelets    a.) DAPT therapy (ASA + clopidogrel )   NSTEMI (non-ST elevated myocardial infarction) (HCC) 11/14/2005   a.) LHC 11/15/2005: 100% RCA, 100% OM2, 75% RI, 75-95% LAD; CVTS consulted. b.) 3v CABG 11/19/2015: LIMA-LAD, SVG-OM2, SVG-RCA; severe pericarditis noted intraoperatively. LAD & OM intramyocardial --> required high doses of epi and milrinone to come off bypass. Extubated POD1, however developed resp distress and A.flutter --> DCCV x 1 and reintubated. Weaned from ventilator on POD4.   NSTEMI (non-ST elevated myocardial infarction) (HCC) 2009   a.) LHC --> 90% RI --> PCI performed placing a 2.5 x 12 mm Microdriver BMS.   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   a.) LIMA-LAD, SVG-OM2, SVG-RCA   Sepsis (HCC)    Sleep apnea    a.) no nocturnal PAP therapy   T2DM (type 2 diabetes mellitus) (HCC)     Past Surgical History:  Procedure Laterality Date   CARDIAC CATHETERIZATION Left 11/15/2005   Procedure: CARDIAC CATHETERIZATION; Location: [facility]; Surgeon: Melanee, MD   COLONOSCOPY  11/30/2013   COLONOSCOPY  05/05/2017   CORONARY ANGIOPLASTY WITH STENT PLACEMENT Left 2009   Procedure: CORONARY ANGIOPLASTY WITH STENT PLACEMENT (  2.5 x 12 mm Microdriver BMS to RI)   CORONARY ANGIOPLASTY WITH STENT PLACEMENT Left 02/01/2014   Procedure: CORONARY ANGIOPLASTY WITH STENT PLACEMENT (overlapping 2.25 x 12 mm dLAD and 2.75 x 20 mm pLAD Promus Premier DES): Location: [facility]; Surgeon: Suella Finders, MD   CORONARY ANGIOPLASTY WITH STENT PLACEMENT Left 12/31/2015   Procedure: STAGED CORONARY ANGIOPLASTY WITH STENT PLACEMENT  (3.5 x 16 mm pRCA and 2.5 x 32 mm mRCA Promus Premier DES); Location: [facility]; Surgeon: Suella Finders, MD   CORONARY ANGIOPLASTY WITH STENT PLACEMENT  08/31/2018   Procedure: CORONARY ANGIOPLASTY WITH STENT PLACEMENT (2.5 x 30 mm Resolute Onyx DES to RPLB); [facility]: [facility]; Surgeon: Zachary Car, MD   CORONARY ARTERY BYPASS GRAFT  11/18/2004   Procedure: 3v CORONARY ARTERY BYPASS GRAFT; Location: [facility]; Surgeon: Lajoyce Farr, MD)   ERCP  10/06/2021   HERNIA REPAIR Bilateral    LEFT HEART CATH AND CORS/GRAFTS ANGIOGRAPHY Left 12/23/2015   Procedure: LEFT HEART CATH AND CORS/GRAFTS ANGIOGRAPHY; Location: [facility]; Surgeon: Suella Finders, MD   SPINE SURGERY  05/19/2023   UMBILICAL HERNIA REPAIR N/A 10/30/2021   Procedure: HERNIA REPAIR UMBILICAL ADULT;  Surgeon: Jordis Laneta FALCON, MD;  Location: [facility];  Service: General;  Laterality: N/A;     Home Medications:  Prior to Admission medications  Medication Sig Start Date End Date Taking? Authorizing Provider  albuterol  (VENTOLIN  HFA) 108 (90 Base) MCG/ACT inhaler Inhale 2 puffs into the lungs every 6 (six) hours as needed for shortness of breath. 10/07/21  Yes [provider]  aspirin  81 MG EC tablet Take 81 mg by mouth daily. Swallow whole.   Yes [provider]  atorvastatin  (LIPITOR) 40 MG tablet Take 40 mg by mouth at bedtime. 07/23/21  Yes [provider]  clopidogrel  (PLAVIX ) 75 MG tablet Take 75 mg by mouth daily. 10/25/23 11/19/24 Yes [provider]  colchicine  0.6 MG tablet Take 0.6 mg by mouth daily. 11/30/23  Yes [provider]  famotidine  (PEPCID ) 20 MG tablet Take 20 mg by mouth as needed for heartburn or indigestion.   Yes [provider]  furosemide  (LASIX ) 20 MG tablet Take 2 tablets (40 mg total) by mouth daily for 6 days. 09/01/23 09/17/24 Yes Van Knee, MD  gabapentin  (NEURONTIN ) 300 MG capsule Take 300-600 mg by mouth See admin instructions. Take 300 mg morning and 600 mg at night 07/24/21   Yes [provider]  ibuprofen (ADVIL) 200 MG tablet Take 400 mg by mouth every 6 (six) hours as needed for headache or moderate pain.   Yes [provider]  lisinopril  (ZESTRIL ) 5 MG tablet Take 5 mg by mouth 2 (two) times daily. 04/28/21 09/17/24 Yes [provider]  metFORMIN (GLUCOPHAGE) 1000 MG tablet Take 1,000 mg by mouth at bedtime. 04/22/21 09/17/24 Yes [provider]  methocarbamol (ROBAXIN) 500 MG tablet Take 500 mg by mouth every 8 (eight) hours as needed for muscle spasms.   Yes [provider]  metoprolol  tartrate (LOPRESSOR ) 50 MG tablet Take 50 mg by mouth 2 (two) times daily. 12/09/20 09/17/24 Yes [provider]  morphine  (MS CONTIN ) 15 MG 12 hr tablet Take 15 mg by mouth every 8 (eight) hours.   Yes [provider]  naloxone (NARCAN) nasal spray 4 mg/0.1 mL Place 1 spray into the nose as needed. One spray in either nostril once for known/suspected overdose. May repeat every 2 to 3 minutes in alternating nostrils until EMS arrives. 04/22/21  Yes [provider]  omeprazole (  PRILOSEC) 20 MG capsule Take 20 mg by mouth daily. 11/30/23 11/29/24 Yes [provider]  traZODone  (DESYREL ) 100 MG tablet Take 200 mg by mouth at bedtime. 08/09/24  Yes [provider]  methylPREDNISolone  (MEDROL  DOSEPAK) 4 MG TBPK tablet Take according to the package insert. Patient not taking: Reported on 09/17/2024 05/27/23   Bernardino Ditch, NP  montelukast (SINGULAIR) 10 MG tablet Take 10 mg by mouth at bedtime. Patient not taking: Reported on 09/17/2024 11/30/23 11/29/24  [provider]  Multiple Vitamin (MULTIVITAMIN WITH MINERALS) TABS tablet Take 1 tablet by mouth daily. Patient not taking: Reported on 09/17/2024    [provider]    Scheduled Meds:  apixaban   5 mg Oral BID   atorvastatin   40 mg Oral Daily   colchicine   0.6 mg Oral Daily   doxycycline   100 mg Oral Q12H   furosemide   40 mg Oral Daily    gabapentin   300 mg Oral q morning   gabapentin   600 mg Oral QHS   insulin  aspart  0-5 Units Subcutaneous QHS   insulin  aspart  0-9 Units Subcutaneous TID WC   metoprolol  tartrate  50 mg Oral BID   morphine   15 mg Oral Q8H   pantoprazole   40 mg Oral Daily   traZODone   200 mg Oral QHS   Continuous Infusions:  amiodarone  30 mg/hr (09/21/24 0534)   cefTRIAXone  (ROCEPHIN )  IV 2 g (09/20/24 2240)   PRN Meds: acetaminophen  **OR** acetaminophen , famotidine , ipratropium-albuterol , ondansetron  **OR** ondansetron  (ZOFRAN ) IV  Allergies:   Allergies[1]  Social History:   Social History   Socioeconomic History   Marital status: Married    Spouse name: Not on file   Number of children: Not on file   Years of education: Not on file   Highest education level: Not on file  Occupational History   Not on file  Tobacco Use   Smoking status: Former    Current packs/day: 0.00    Average packs/day: 2.0 packs/day for 35.0 years (70.0 ttl pk-yrs)    Types: Cigarettes    Start date: 68    Quit date: 2002    Years since quitting: 24.0    Passive exposure: Never   Smokeless tobacco: Never  Vaping Use   Vaping status: Never Used  Substance and Sexual Activity   Alcohol use: Not Currently   Drug use: Never   Sexual activity: Yes    Partners: Female  Other Topics Concern   Not on file  Social History Narrative   Not on file   Social Drivers of Health   Tobacco Use: Medium Risk (09/17/2024)   Patient History    Smoking Tobacco Use: Former    Smokeless Tobacco Use: Never    Passive Exposure: Never  Physicist, Medical Strain: Low Risk (03/28/2024)   Received from [facility]   Overall Financial Resource Strain (CARDIA)    How hard is it for you to pay for the very basics like food, housing, medical care, and heating?: Not hard at all  Food Insecurity: No Food Insecurity (09/19/2024)   Epic    Worried About Programme Researcher, Broadcasting/film/video in the Last Year: Never true    Ran Out of Food in the  Last Year: Never true  Transportation Needs: No Transportation Needs (09/19/2024)   Epic    Lack of Transportation (Medical): No    Lack of Transportation (Non-Medical): No  Physical Activity: Not on file  Stress: Not on file  Social Connections: Moderately  Isolated (09/19/2024)   Social Connection and Isolation Panel    Frequency of Communication with Friends and Family: More than three times a week    Frequency of Social Gatherings with Friends and Family: More than three times a week    Attends Religious Services: Never    Database Administrator or Organizations: No    Attends Banker Meetings: Never    Marital Status: Married  Catering Manager Violence: Not At Risk (09/19/2024)   Epic    Fear of Current or Ex-Partner: No    Emotionally Abused: No    Physically Abused: No    Sexually Abused: No  Depression (PHQ2-9): Not on file  Alcohol Screen: Not on file  Housing: Low Risk (09/19/2024)   Epic    Unable to Pay for Housing in the Last Year: No    Number of Times Moved in the Last Year: 0    Homeless in the Last Year: No  Utilities: Not At Risk (09/19/2024)   Epic    Threatened with loss of utilities: No  Health Literacy: Low Risk (03/28/2024)   Received from [facility]    How often do you need to have someone help you when you read instructions, pamphlets, or other written material from your doctor or pharmacy?: Never    Family History:   History reviewed. No pertinent family history.   ROS:  Please see the history of present illness.  Review of Systems  Constitutional: Negative.   HENT: Negative.    Respiratory:  Positive for shortness of breath.   Cardiovascular: Negative.   Gastrointestinal: Negative.   Musculoskeletal: Negative.   Neurological: Negative.   Psychiatric/Behavioral: Negative.    All other systems reviewed and are negative.   Physical Exam/Data: Vitals:   09/20/24 2017 09/21/24 0042 09/21/24 0353 09/21/24 0942   BP:  108/75 119/73 137/71  Pulse:  66 68 78  Resp:  18 18   Temp:  97.9 F (36.6 C) 98.6 F (37 C) 98.5 F (36.9 C)  TempSrc:    Oral  SpO2: 94% 98% 99% 100%  Weight:      Height:        Intake/Output Summary (Last 24 hours) at 09/21/2024 1005 Last data filed at 09/21/2024 0300 Gross per 24 hour  Intake 623.92 ml  Output 225 ml  Net 398.92 ml      09/19/2024   10:08 PM 09/18/2024    3:22 AM 09/01/2023    4:33 PM  Last 3 Weights  Weight (lbs) 175 lb 11.3 oz 176 lb 170 lb  Weight (kg) 79.7 kg 79.833 kg 77.111 kg     Body mass index is 25.21 kg/m.  General:  Well nourished, well developed, in no acute distress HEENT: normal Neck: no JVD Vascular: No carotid bruits; Distal pulses 2+ bilaterally Cardiac: RRR; no murmur  Lungs: Coarse breath sounds bilaterally Abd: soft, nontender, no hepatomegaly  Ext: no edema Musculoskeletal:  No deformities, BUE and BLE strength normal and equal Skin: warm and dry  Neuro:  CNs 2-12 intact, no focal abnormalities noted Psych:  Normal affect   EKG: Sinus rhythm  Telemetry: Sinus rhythm with atrial bigeminy  Relevant CV Studies: Echo pending  Laboratory Data: High Sensitivity Troponin:  No results for input(s): TROPONINIHS in the last 720 hours. No results for input(s): TRNPT in the last 720 hours.    Chemistry Recent Labs  Lab 09/18/24 0417 09/19/24 0441 09/20/24 0517 09/20/24 1306 09/21/24  0432  NA  --  142 140  --  139  K  --  3.7 3.7  --  4.5  CL  --  107 106  --  106  CO2  --  24 21*  --  25  GLUCOSE  --  111* 111*  --  138*  BUN  --  11 9  --  11  CREATININE  --  1.01 1.04  --  0.99  CALCIUM   --  9.1 9.4  --  8.7*  MG 1.0* 1.9  --  1.5*  --   GFRNONAA  --  >60 >60  --  >60  ANIONGAP  --  12 13  --  8    Recent Labs  Lab 09/17/24 1517  PROT 7.3  ALBUMIN  4.6  AST 27  ALT 14  ALKPHOS 79  BILITOT 1.1   Lipids No results for input(s): CHOL, TRIG, HDL, LABVLDL, LDLCALC, CHOLHDL in the last  168 hours.  Hematology Recent Labs  Lab 09/19/24 0441 09/20/24 0517 09/21/24 0432  WBC 7.9 7.9 6.7  RBC 4.15* 4.32 3.59*  HGB 12.7* 13.2 11.0*  HCT 37.8* 39.4 33.0*  MCV 91.1 91.2 91.9  MCH 30.6 30.6 30.6  MCHC 33.6 33.5 33.3  RDW 12.8 12.5 12.6  PLT 149* 164 155   Thyroid   Recent Labs  Lab 09/20/24 1306  TSH 1.060    BNPNo results for input(s): BNP, PROBNP in the last 168 hours.  DDimer No results for input(s): DDIMER in the last 168 hours.  Radiology/Studies:  DG Chest Port 1 View Result Date: 09/17/2024 CLINICAL DATA:  Sepsis, altered level of consciousness EXAM: PORTABLE CHEST 1 VIEW COMPARISON:  08/03/2022 FINDINGS: Single frontal view of the chest demonstrates stable enlargement of the cardiac silhouette. There is new opacification at the right lung base, consistent with right basilar pneumonia. No effusion or pneumothorax. No acute bony abnormalities. IMPRESSION: 1. Right basilar opacification consistent with pneumonia. Followup PA and lateral chest X-ray is recommended in 3-4 weeks following trial of antibiotic therapy to ensure resolution and exclude underlying malignancy. 2. Stable enlarged cardiac silhouette. Electronically Signed   By: Ozell Daring M.D.   On: 09/17/2024 16:32     Assessment and Plan: Atrial fibrillation/flutter with RVR Paroxysmal atrial fibrillation Converted overnight with amiodarone  gtt.  Feeling well this morning. Likely provoked by PNA.  Plan: - Continue treatment of underlying PNA  - Continue Eliquis  5 mg twice daily - Consolidate metoprolol  to succinate 100 mg once daily upon discharge - Recommend converting amiodarone  to PO formulation; can do 400mg  BID until a load of 5-10mg  has been achieved (including dosage already obtained this hospitalization) and then go to 200mg  every day thereafter.  - He can see us  outpatient if he would like or else he can follow-up with his [facility] providers  2).  Ischemic Cardiomyopathy Prior ejection  fraction 45 to 50% in December 2022. 30-35% on echo from this admission.  - Appears relatively euvolemic - Started on metoprolol  to tartrate with plan to transition to metoprolol  succinate after adequate rate control - Start farxiga  10mg  every day today - If BP/kidney function allow, then start spironolactone 25mg  every day tomorrow. On lisinopril  at home, can also resume this is tolerated   3) Right-sided pneumonia/sepsis Presenting with fever, delirium, chest x-ray with pneumonia, hypoxia -Symptoms improving on broad-spectrum antibiotics, mentation improved  For questions or updates, please contact [facility] Please consult [email or web address] for contact info under    Signed, Caron  Argentina, MD  09/21/2024 10:05 AM      [1]  Allergies Allergen Reactions   Ativan  [Lorazepam ] Other (See Comments)    Causes AMS   Nitroglycerin Other (See Comments)    Blood pressure too low   Zolpidem     Weird behavior   "

## 2024-09-21 NOTE — Discharge Instructions (Signed)

## 2024-09-22 ENCOUNTER — Other Ambulatory Visit: Payer: Self-pay

## 2024-09-22 DIAGNOSIS — I429 Cardiomyopathy, unspecified: Secondary | ICD-10-CM

## 2024-09-22 DIAGNOSIS — I4891 Unspecified atrial fibrillation: Secondary | ICD-10-CM | POA: Diagnosis not present

## 2024-09-22 LAB — BASIC METABOLIC PANEL WITH GFR
Anion gap: 10 (ref 5–15)
BUN: 13 mg/dL (ref 8–23)
CO2: 28 mmol/L (ref 22–32)
Calcium: 9.3 mg/dL (ref 8.9–10.3)
Chloride: 105 mmol/L (ref 98–111)
Creatinine, Ser: 1.23 mg/dL (ref 0.61–1.24)
GFR, Estimated: 59 mL/min — ABNORMAL LOW
Glucose, Bld: 118 mg/dL — ABNORMAL HIGH (ref 70–99)
Potassium: 4.3 mmol/L (ref 3.5–5.1)
Sodium: 142 mmol/L (ref 135–145)

## 2024-09-22 LAB — CULTURE, BLOOD (ROUTINE X 2)
Culture: NO GROWTH
Culture: NO GROWTH
Special Requests: ADEQUATE

## 2024-09-22 LAB — CBC
HCT: 35.6 % — ABNORMAL LOW (ref 39.0–52.0)
Hemoglobin: 12 g/dL — ABNORMAL LOW (ref 13.0–17.0)
MCH: 30.5 pg (ref 26.0–34.0)
MCHC: 33.7 g/dL (ref 30.0–36.0)
MCV: 90.6 fL (ref 80.0–100.0)
Platelets: 178 K/uL (ref 150–400)
RBC: 3.93 MIL/uL — ABNORMAL LOW (ref 4.22–5.81)
RDW: 12.5 % (ref 11.5–15.5)
WBC: 8.4 K/uL (ref 4.0–10.5)
nRBC: 0 % (ref 0.0–0.2)

## 2024-09-22 LAB — GLUCOSE, CAPILLARY
Glucose-Capillary: 117 mg/dL — ABNORMAL HIGH (ref 70–99)
Glucose-Capillary: 111 mg/dL — ABNORMAL HIGH (ref 70–99)

## 2024-09-22 LAB — PRO BRAIN NATRIURETIC PEPTIDE: Pro Brain Natriuretic Peptide: 7217 pg/mL — ABNORMAL HIGH

## 2024-09-22 MED ORDER — METOPROLOL SUCCINATE ER 100 MG PO TB24
100.0000 mg | ORAL_TABLET | Freq: Every day | ORAL | 0 refills | Status: AC
  Filled 2024-09-22: qty 30, 30d supply, fill #0

## 2024-09-22 MED ORDER — APIXABAN 5 MG PO TABS
5.0000 mg | ORAL_TABLET | Freq: Two times a day (BID) | ORAL | 0 refills | Status: AC
  Filled 2024-09-22: qty 60, 30d supply, fill #0

## 2024-09-22 MED ORDER — LOSARTAN POTASSIUM 25 MG PO TABS
25.0000 mg | ORAL_TABLET | Freq: Every day | ORAL | 0 refills | Status: AC
  Filled 2024-09-22: qty 30, 30d supply, fill #0

## 2024-09-22 MED ORDER — DAPAGLIFLOZIN PROPANEDIOL 10 MG PO TABS
10.0000 mg | ORAL_TABLET | Freq: Every day | ORAL | 0 refills | Status: AC
  Filled 2024-09-22: qty 30, 30d supply, fill #0

## 2024-09-22 MED ORDER — LOSARTAN POTASSIUM 25 MG PO TABS
25.0000 mg | ORAL_TABLET | Freq: Every day | ORAL | Status: DC
  Administered 2024-09-22: 25 mg via ORAL
  Filled 2024-09-22: qty 1

## 2024-09-22 MED ORDER — AMIODARONE HCL 200 MG PO TABS
ORAL_TABLET | ORAL | 0 refills | Status: AC
  Filled 2024-09-22: qty 68, 43d supply, fill #0

## 2024-09-22 NOTE — Progress Notes (Signed)
 O2 walk test  Patient O2 saturation at rest on room air 95%  Patient O2 saturation ambulating on room air 86%  Patient O2 saturation ambulating on 2L nasal cannula  90%

## 2024-09-22 NOTE — TOC CM/SW Note (Signed)
 O2 walk test   Patient O2 saturation at rest on room air 95%   Patient O2 saturation ambulating on room air 86%   Patient O2 saturation ambulating on 2L nasal cannula  90%

## 2024-09-22 NOTE — Plan of Care (Signed)

## 2024-09-22 NOTE — Progress Notes (Addendum)
 1800 :- Patient was discharged and was waiting for the oxygen to be delivered at bedside. Discharge orders were placed at 1544. Called to [facility] and requested for ETA, they said they will be here in 30 minutes or give a call to patient to update an ETA.

## 2024-09-22 NOTE — Progress Notes (Signed)
 "  Cardiology Progress Note   Patient Name: Clifford Martin Date of Encounter: 09/22/2024  Primary Cardiologist: [facility]  Subjective   Feels well this AM.  Eager to go home.  Still on O2 @ 3lpm (was not on at home previously).  Feels like abd is somewhat bloated, which he attributes to having been off lasix  following admission.  Lasix  resumed yesterday - noted good UO. Objective   Inpatient Medications    Scheduled Meds:  [START ON 09/27/2024] amiodarone   200 mg Oral Daily   amiodarone   400 mg Oral BID   apixaban   5 mg Oral BID   atorvastatin   40 mg Oral Daily   colchicine   0.6 mg Oral Daily   dapagliflozin  propanediol  10 mg Oral Daily   doxycycline   100 mg Oral Q12H   furosemide   40 mg Oral Daily   gabapentin   300 mg Oral q morning   gabapentin   600 mg Oral QHS   insulin  aspart  0-5 Units Subcutaneous QHS   insulin  aspart  0-9 Units Subcutaneous TID WC   metoprolol  succinate  100 mg Oral Daily   morphine   15 mg Oral Q8H   pantoprazole   40 mg Oral Daily   traZODone   200 mg Oral QHS   Continuous Infusions:  PRN Meds: acetaminophen  **OR** acetaminophen , famotidine , ipratropium-albuterol , ondansetron  **OR** ondansetron  (ZOFRAN ) IV   Vital Signs    Vitals:   09/21/24 2109 09/22/24 0116 09/22/24 0449 09/22/24 0830  BP: 115/79 (!) 117/59 125/72 117/67  Pulse: 87 65 77 77  Resp: 20 19 20    Temp: 99.6 F (37.6 C) 98.4 F (36.9 C) 98.9 F (37.2 C) 98.4 F (36.9 C)  TempSrc:  Oral  Oral  SpO2: 93% 90% 94% 93%  Weight:      Height:        Intake/Output Summary (Last 24 hours) at 09/22/2024 0910 Last data filed at 09/22/2024 0600 Gross per 24 hour  Intake 1080 ml  Output 2900 ml  Net -1820 ml   Filed Weights   09/18/24 0322 09/19/24 2208  Weight: 79.8 kg 79.7 kg    Physical Exam   GEN: Well nourished, well developed, in no acute distress.  HEENT: Grossly normal.  Neck: Supple, mildly elev JVP.  No carotid bruits or masses. Cardiac: RRR, no murmurs, rubs, or  gallops. No clubbing, cyanosis, edema.  Radials 2+, DP/PT 2+ and equal bilaterally.  Respiratory:  Respirations regular and unlabored, bibasilar crackles. GI: semi-firm, protuberant, BS + x 4. MS: no deformity or atrophy. Skin: warm and dry, no rash. Neuro:  Strength and sensation are intact. Psych: AAOx3.  Normal affect.  Labs    Chemistry Recent Labs  Lab 09/17/24 1517 09/19/24 0441 09/20/24 0517 09/21/24 0432 09/22/24 0437  NA 140   < > 140 139 142  K 4.1   < > 3.7 4.5 4.3  CL 101   < > 106 106 105  CO2 29   < > 21* 25 28  GLUCOSE 169*   < > 111* 138* 118*  BUN 13   < > 9 11 13   CREATININE 1.35*   < > 1.04 0.99 1.23  CALCIUM  9.2   < > 9.4 8.7* 9.3  PROT 7.3  --   --   --   --   ALBUMIN  4.6  --   --   --   --   AST 27  --   --   --   --   ALT 14  --   --   --   --  ALKPHOS 79  --   --   --   --   BILITOT 1.1  --   --   --   --   GFRNONAA 53*   < > >60 >60 59*  ANIONGAP 11   < > 13 8 10    < > = values in this interval not displayed.     Hematology Recent Labs  Lab 09/20/24 0517 09/21/24 0432 09/22/24 0437  WBC 7.9 6.7 8.4  RBC 4.32 3.59* 3.93*  HGB 13.2 11.0* 12.0*  HCT 39.4 33.0* 35.6*  MCV 91.2 91.9 90.6  MCH 30.6 30.6 30.5  MCHC 33.5 33.3 33.7  RDW 12.5 12.6 12.5  PLT 164 155 178    BNP    Component Value Date/Time   BNP 459.6 (H) 08/28/2021 0145   HbA1c  Lab Results  Component Value Date   HGBA1C 5.5 09/17/2024    Radiology    DG Chest Port 1 View Result Date: 09/21/2024 EXAM: 1 VIEW(S) XRAY OF THE CHEST 09/21/2024 09:50:00 AM COMPARISON: 09/17/2024 CLINICAL HISTORY: Shortness of breath; Bibasilar crackles FINDINGS: LUNGS AND PLEURA: Increased patchy airspace opacity at right lung base. Mild increase in diffuse interstitial markings. New small right pleural effusion. No pneumothorax. HEART AND MEDIASTINUM: Stable cardiomegaly. Aortic atherosclerosis. Post CABG changes. No acute abnormality of the cardiac and mediastinal silhouettes. BONES AND  SOFT TISSUES: Sternotomy wires. No acute osseous abnormality. IMPRESSION: 1. Increased patchy airspace opacity at the right lung base and mild increase in diffuse interstitial markings. 2. New small right pleural effusion. 3. Aortic Atherosclerosis (ICD10-I70.0). Electronically signed by: Ryan Chess MD 09/21/2024 01:40 PM EST RP Workstation: [id number]    Telemetry    RSR, 70's PVCs - Personally Reviewed  Cardiac Studies   2D Echocardiogram 1.16.2026  1. Left ventricular ejection fraction, by estimation, is 30 to 35%. The  left ventricle has moderately decreased function. The left ventricle  demonstrates regional wall motion abnormalities (see scoring  diagram/findings for description). Left ventricular   diastolic parameters are consistent with Grade I diastolic dysfunction  (impaired relaxation). There is akinesis of the left ventricular,  mid-apical inferior wall and inferolateral wall with thinning consistent  with infarct/scar.   2. Right ventricular systolic function is normal. The right ventricular  size is normal. There is normal pulmonary artery systolic pressure. The  estimated right ventricular systolic pressure is 35.2 mmHg.   3. The mitral valve is abnormal. 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 No evidence of mitral stenosis.   4. The aortic valve is tricuspid. There is mild calcification of the  aortic valve. Aortic valve regurgitation is trivial. Aortic valve  sclerosis/calcification is present, without any evidence of aortic  stenosis.   5. Pulmonic valve regurgitation is moderate.   6. The inferior vena cava is normal in size with <50% respiratory  variability, suggesting right atrial pressure of 8 mmHg.   Patient Profile     84 y.o. male w/ a h/o ICM/HFmrEF, CAD s/p 3v CABG (2007), HTN, HL, DM2, OSA, and GERD, who was admitted 09/17/2024 w/ AMS, fever, and PNA.  Developed rapid AFib on 1/15, converted to sinus on IV  Amio.  Assessment & Plan    1.  Afib w/ RVR:  Developed 1/15 in setting of admission for PNA.  Converted on IV amio and transitioned to PO amio on 1/16.  Maintaining sinus rhythm.  Cont ? blocker, amio 400 bid x 4 more days then drop to 200 mg BID x 5  days, then 200 mg daily.  Cont eliquis  5mg  BID (CHA2DS2VASc = 6).  F/u with cardiologist @ [facility] within week of d/c.   2.  PNA/Sepsis:  Cont to improve. Eager to go home.  Remains on O2. Abx per medicine team.  Hopefully can wean O2.  3.  Chronic HFrEF/ICM:  Prior EF of 45-50%.  In setting of above, EF down further - 30-35%, GrI DD, mid=apical inf, inflat AK/scar, RVSP 35.57mmHg, mod MR, AoV sclerosis.  He has noted some inc in abd girth this admission.  Lasix  was held on admission in the setting of sepsis, AKI and resumed yesterday w/ good response. JVP is mildly elevated. Bibasilar crackles. Creat up sl this AM from yesterday (1.23).  Given good response to PO lasix , will cont current dose.  Cont ? blocker (changed to toprol  xl today).  Was on lisinopril  at home - will change to low dose ARB w/ eye toward transitioning to entresto in the outpt setting.  Cont farxiga .  4.  CAD:  s/p prior CABG and subsequent PCIs.  No c/p.  Trops not eval this admission.  EF further reduced.  Cont ? blocker, and statin. ASA d/c'd in setting of eliquis .  F/u primary cardiology team @ [facility].  5.  HTN:  Stable.  6.  HL:  cont statin.  7.  DMII:  A1c 5.5.  Per IM.  Signed, Lonni Meager, NP  09/22/2024, 9:10 AM    For questions or updates, please contact   Please consult [email or web address] for contact info under Cardiology/STEMI.  "

## 2024-09-22 NOTE — Discharge Summary (Signed)
 " Physician Discharge Summary   Patient: Clifford Martin MRN: [id number] DOB: July 29, 1941  Admit date:     09/17/2024  Discharge date: 09/22/24  Discharge Physician: Duffy Al-Sultani   PCP: [facility]   Recommendations at discharge:   Follow up with PCP within 1 week of discharge. Repeat CBC, CMP, and 2 view CXR. Review all medication changes. Monitor blood pressure and adjust meds as necessary. Reassess for continued need of supplemental O2.  Follow up with [facility] for HFrEF and new Afib. Recommend repeating echo and consider ischemic workup if EF remains low despite maintaining sinus rhythm  Discharge Diagnoses: Principal Problem:   Sepsis due to pneumonia [facility]) Active Problems:   Hypertension   Diabetes mellitus without complication (HCC)   Acute metabolic encephalopathy   Chronic combined systolic and diastolic heart failure (HCC)   Hyperlipidemia   Community acquired pneumonia of right lower lobe of lung   A-fib (HCC)   Altered mental status   Hypoxia   Typical atrial flutter (HCC)   Cardiomyopathy [facility])   Hospital Course: 84 year old male with PMHx of HFmrEF (LVEF 45%), CAD s/p CABG and PCI, HTN, HLD, T2DM, who presented to the ED from home for altered mental status. Patient was in his normal state of health earlier in the morning prior to presentation, but then was noted to be altered. On EMSs arrival he was noted to be febrile to 102 F. On arrival he was disoriented and febrile to 102.4 F, BP 105/73, with RR 33, HR 125, and hypoxic to 86% requiring 2 L O2 with improvement to 91%. CBC was notable for leukocytosis to 15. Lactic acid normal. 4 Plex RVP negative. CXR showed right basilar opacification consistent with pneumonia. Patient was given broad-spectrum antibiotics and IVF bolus. Ultimately he was admitted for severe sepsis and acute hypoxic respiratory failure in the setting of right-sided pneumonia.   Hospital course complicated by new onset  Afib with RVR and volume overload in the setting of initially held Lasix .  Severe sepsis - resolved - Met criteria w/ fever to 102.4, tachycardia to 125, tachypnea to 33, leukocytosis to 15, with acute respiratory failure and altered mental status in the setting of right sided pneumonia - Resolved with management of pneumonia as below   Right-sided pneumonia:  - Afebrile, leukocytosis resolved - Completed therapy with 5 days of IV Rocephin  and Doxycycline    Afib with RVR - Notified by telemetery on 1/15 that patient is in Afib/flutter with RVR sustained > 140 with max of 162 bpm for just over 1 minute - EKG showed Aflutter at 111 bpm - Cardiology was consulted after onset of Afib that morning. The patient was started on an amiodarone  drip and metoprolol  tartrate 50 mg BID. He was also started on Eliquis  5 mg BID.  He converted back to sinus rhythm overnight and was switched from drip to PO amiodarone  loading taper. Ultimately, he was discharged on metoprolol  succinate 100 mg daily and amiodarone  400 mg BID to complete 7 days followed by 200 mg BID x 7 days then amiodarone  200 mg daily.  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 Continue home Lasix  40 mg daily - Follow  up with [facility] cardiology, consider repeating echo outpatient and possibly ischemic workup if EF remains reduced despite maintaining sinus rhythm  Acute hypoxic respiratory failure - Presented with hypoxia to 86% with SOB and tachypnea, with improvement to 91% on 2L [place] - Per RN documentation, desatted to 66% on RA while ambulating without O2 on 1/13 AM, with improvement to 95% on 4  L - Was on and off O2 during the hospitalization - Ambulatory SpO2 prior to discharge noted drop in SpO2 to 86% on ambulation with improvement to 90% on 2L [place] - This is likely secondary to volume overload in the setting of held Lasix  with concomitant new-onset Afib and possible tachycardia mediated worsening of chronic CHF symptoms. The patient was offered extending hospital stay for 1 to 2 days and allow for diuresis using IV Lasix  in hopes to return to euvolemia and improve respiratory function prior to discharge, but the patient insisted on his preference for discharge with supplemental O2 and continued diuresis at home with PO Lasix .  As such, orders were placed for DME home oxygen for 3L, recommend use as needed to maintain SpO2 > 92% - PCP to reevaluate continued need for supplemental O2 after euvolemia is adequately achieved   CAD s/p CABG and PCI - Continue beta blocker and statin - ASA and Plavix  discontinued given Eliquis  was started - Follow up with primary cardiology at [facility] to determine if further ischemic workup is indicated  Acute metabolic encephalopathy - resolved - Likely in setting of pneumonia as above - High risk for hospital delirium. Delirium precautions - Patient is AAOx3.   Hypertension - BP labile, but appears to be more consistent elevated - If persists in this pattern today, will resume home antihypertensive   HLD - Lisinopril  and lopressor  discontinued - Started losartan  25 mg daily and Troprol-XL 100 mg daily   T2DM - Well controlled, HbA1c 5.5.  - Resumed home regimen on discharge   Hypomagnesemia - resolved   Chronic pain - Review of PDMP shows recent fill of Morphine  ER 15 mg 90 quantity for 30 day supply on 08/17/2024 - Continue Morphine  ER at 15 mg TID   At the time of discharge, the patient was hemodynamically stable with improved symptoms, clinically appropriate for discharge, and in no acute distress. The patient maintained that he would rather  be discharged with home O2 as opposed to remain one or two more nights to further optimize his volume status. This was also discussed with the patient's wife. He was cleared by cardiology for discharge on current medications. The discharge plan, follow-up instructions, return precautions, and medication changes were reviewed in detail. The patient verbalized understanding, was in agreement with the plan, and had all questions answered prior to leaving the hospital.       Consultants: Cardiology Procedures performed: None  Disposition: Home Diet recommendation:  Diet Orders (From admission, onward)     Start     Ordered   09/18/24 1140  Diet Carb Modified  Diet effective now       Question Answer Comment  Calorie Level Medium 1600-2000   Fluid consistency: Thin      09/18/24 1140            DISCHARGE MEDICATION: Allergies as of 09/22/2024       Reactions   Ativan  [lorazepam ] Other (See Comments)   Causes AMS   Nitroglycerin Other (See Comments)   Blood pressure too low   Zolpidem    Weird behavior  Medication List     STOP taking these medications    aspirin  EC 81 MG tablet   clopidogrel  75 MG tablet Commonly known as: PLAVIX    ibuprofen 200 MG tablet Commonly known as: ADVIL   lisinopril  5 MG tablet Commonly known as: ZESTRIL    methylPREDNISolone  4 MG Tbpk tablet Commonly known as: MEDROL  DOSEPAK   metoprolol  tartrate 50 MG tablet Commonly known as: LOPRESSOR        TAKE these medications    albuterol  108 (90 Base) MCG/ACT inhaler Commonly known as: VENTOLIN  HFA Inhale 2 puffs into the lungs every 6 (six) hours as needed for shortness of breath.   amiodarone  200 MG tablet Commonly known as: PACERONE  Take 2 tablets (400 mg total) by mouth 2 (two) times daily for 6 days, THEN 1 tablet (200 mg total) 2 (two) times daily for 7 days, THEN 1 tablet (200 mg total) daily. Start taking on: September 22, 2024   atorvastatin  40 MG tablet Commonly  known as: LIPITOR Take 40 mg by mouth at bedtime.   colchicine  0.6 MG tablet Take 0.6 mg by mouth daily.   dapagliflozin  propanediol 10 MG Tabs tablet Commonly known as: FARXIGA  Take 1 tablet (10 mg total) by mouth daily. Start taking on: September 23, 2024   Eliquis  5 MG Tabs tablet Generic drug: apixaban  Take 1 tablet (5 mg total) by mouth 2 (two) times daily.   famotidine  20 MG tablet Commonly known as: PEPCID  Take 20 mg by mouth as needed for heartburn or indigestion.   furosemide  20 MG tablet Commonly known as: LASIX  Take 2 tablets (40 mg total) by mouth daily for 6 days.   gabapentin  300 MG capsule Commonly known as: NEURONTIN  Take 300-600 mg by mouth See admin instructions. Take 300 mg morning and 600 mg at night   losartan  25 MG tablet Commonly known as: COZAAR  Take 1 tablet (25 mg total) by mouth daily. Start taking on: September 23, 2024   metFORMIN 1000 MG tablet Commonly known as: GLUCOPHAGE Take 1,000 mg by mouth at bedtime.   methocarbamol 500 MG tablet Commonly known as: ROBAXIN Take 500 mg by mouth every 8 (eight) hours as needed for muscle spasms.   metoprolol  succinate 100 MG 24 hr tablet Commonly known as: TOPROL -XL Take 1 tablet (100 mg total) by mouth daily. Take with or immediately following a meal. Start taking on: September 23, 2024   montelukast 10 MG tablet Commonly known as: SINGULAIR Take 10 mg by mouth at bedtime.   morphine  15 MG 12 hr tablet Commonly known as: MS CONTIN  Take 15 mg by mouth every 8 (eight) hours.   multivitamin with minerals Tabs tablet Take 1 tablet by mouth daily.   naloxone 4 MG/0.1ML Liqd nasal spray kit Commonly known as: NARCAN Place 1 spray into the nose as needed. One spray in either nostril once for known/suspected overdose. May repeat every 2 to 3 minutes in alternating nostrils until EMS arrives.   omeprazole 20 MG capsule Commonly known as: PRILOSEC Take 20 mg by mouth daily.   traZODone  100 MG  tablet Commonly known as: DESYREL  Take 200 mg by mouth at bedtime.               Durable Medical Equipment  (From admission, onward)           Start     Ordered   09/22/24 1551  DME Oxygen  Once       Question Answer Comment  Length of Need Lifetime  Liters per Minute 3   Oxygen delivery system: Gas   Oxygen delivery system: Concentrator   Oxygen delivery system: Portable concentrator (POC)      09/22/24 1551            Follow-up Information     Antonetta Okey JINNY Mickey., MD Follow up in 1 week(s).   Specialty: Cardiology Why: Follow up for congestive heart failure and atrial fibrillation Contact information: [address] [place] [address] [phone]         [facility]. Schedule an appointment as soon as possible for a visit in 1 week(s).   Contact information: [facility] [address] [place] [address] [phone]                 Discharge Exam: Fredricka Weights   09/18/24 0322 09/19/24 2208  Weight: 79.8 kg 79.7 kg   Blood pressure 100/62, pulse 77, temperature 98 F (36.7 C), temperature source Oral, resp. rate 20, height 5' 10 (1.778 m), weight 79.7 kg, SpO2 100%.   Gen: NAD, A&Ox3 Neck: Supple CV: RRR, no murmurs appreciates Resp: normal WOB, bibasilar crackles Abd: Soft, NTND, no guarding Ext: Trace LE edema Skin: Warm, dry Neuro: No focal deficits Psych: Calm, cooperative, appropriate affect  Condition at discharge: good  The results of significant diagnostics from this hospitalization (including imaging, microbiology, ancillary and laboratory) are listed below for reference.   Imaging Studies: DG Chest Port 1 View Result Date: 09/21/2024 EXAM: 1 VIEW(S) XRAY OF THE CHEST 09/21/2024 09:50:00 AM COMPARISON: 09/17/2024 CLINICAL HISTORY: Shortness of breath; Bibasilar crackles FINDINGS: LUNGS AND PLEURA: Increased patchy airspace opacity at right lung base.  Mild increase in diffuse interstitial markings. New small right pleural effusion. No pneumothorax. HEART AND MEDIASTINUM: Stable cardiomegaly. Aortic atherosclerosis. Post CABG changes. No acute abnormality of the cardiac and mediastinal silhouettes. BONES AND SOFT TISSUES: Sternotomy wires. No acute osseous abnormality. IMPRESSION: 1. Increased patchy airspace opacity at the right lung base and mild increase in diffuse interstitial markings. 2. New small right pleural effusion. 3. Aortic Atherosclerosis (ICD10-I70.0). Electronically signed by: Ryan Chess MD 09/21/2024 01:40 PM EST RP Workstation: [id number]   ECHOCARDIOGRAM COMPLETE Result Date: 09/21/2024    ECHOCARDIOGRAM REPORT   Patient Name:   Clifford Martin Date of Exam: 09/21/2024 Medical Rec #:  [id number]          Height:       70.1 in Accession #:    [id number]         Weight:       175.7 lb Date of Birth:  1940-12-21          BSA:          1.977 m Patient Age:    83 years           BP:           114/92 mmHg Patient Gender: M                  HR:           74 bpm. Exam Location:  [facility] Procedure: 2D Echo, 3D Echo, Cardiac Doppler, Color Doppler and Intracardiac            Opacification Agent (Both Spectral and Color Flow Doppler were            utilized during procedure). Indications:     Atrial Flutter  History:  Patient has prior history of Echocardiogram examinations, most                  recent 08/31/2021. CHF, Arrythmias:Atrial Fibrillation; Risk                  Factors:Hypertension, Diabetes and Dyslipidemia.  Sonographer:     Philomena Daring Referring Phys:  [id number]ONAL[address] BERGE Diagnosing Phys: Caron Poser IMPRESSIONS  1. Left ventricular ejection fraction, by estimation, is 30 to 35%. The left ventricle has moderately decreased function. The left ventricle demonstrates regional wall motion abnormalities (see scoring diagram/findings for description). Left ventricular  diastolic parameters are consistent with Grade I  diastolic dysfunction (impaired relaxation). There is akinesis of the left ventricular, mid-apical inferior wall and inferolateral wall with thinning consistent with infarct/scar.  2. Right ventricular systolic function is normal. The right ventricular size is normal. There is normal pulmonary artery systolic pressure. The estimated right ventricular systolic pressure is 35.2 mmHg.  3. The mitral valve is abnormal. 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 No evidence of mitral stenosis.  4. The aortic valve is tricuspid. There is mild calcification of the aortic valve. Aortic valve regurgitation is trivial. Aortic valve sclerosis/calcification is present, without any evidence of aortic stenosis.  5. Pulmonic valve regurgitation is moderate.  6. The inferior vena cava is normal in size with <50% respiratory variability, suggesting right atrial pressure of 8 mmHg. Comparison(s): A prior study was performed on 08/31/2021. The left ventricular function is worsened. FINDINGS  Left Ventricle: Left ventricular ejection fraction, by estimation, is 30 to 35%. The left ventricle has moderately decreased function. The left ventricle demonstrates regional wall motion abnormalities. Definity  contrast agent was given IV to delineate the left ventricular endocardial borders. The left ventricular internal cavity size was normal in size. There is no left ventricular hypertrophy. Left ventricular diastolic parameters are consistent with Grade I diastolic dysfunction (impaired relaxation). Right Ventricle: The right ventricular size is normal. Right vetricular wall thickness was not well visualized. Right ventricular systolic function is normal. There is normal pulmonary artery systolic pressure. The tricuspid regurgitant velocity is 2.61 m/s, and with an assumed right atrial pressure of 8 mmHg, the estimated right ventricular systolic pressure is 35.2 mmHg. Left Atrium: Left atrial size was normal in  size. Right Atrium: Right atrial size was normal in size. Pericardium: There is no evidence of pericardial effusion. Mitral Valve: The mitral valve is abnormal. Moderate mitral valve regurgitation. No evidence of mitral valve stenosis. Tricuspid Valve: The tricuspid valve is normal in structure. Tricuspid valve regurgitation is mild . No evidence of tricuspid stenosis. Aortic Valve: The aortic valve is tricuspid. There is mild calcification of the aortic valve. Aortic valve regurgitation is trivial. Aortic valve sclerosis/calcification is present, without any evidence of aortic stenosis. Pulmonic Valve: The pulmonic valve was normal in structure. Pulmonic valve regurgitation is moderate. No evidence of pulmonic stenosis. Aorta: The aortic root and ascending aorta are structurally normal, with no evidence of dilitation. Venous: The inferior vena cava is normal in size with less than 50% respiratory variability, suggesting right atrial pressure of 8 mmHg. IAS/Shunts: The interatrial septum was not well visualized. Additional Comments: 3D was performed not requiring image post processing on an independent workstation and was abnormal.  LEFT VENTRICLE PLAX 2D LVIDd:         5.70 cm   Diastology LVIDs:         5.10 cm   LV e' medial:  6.31 cm/s LV PW:         0.70 cm   LV E/e' medial:  18.4 LV IVS:        1.00 cm   LV e' lateral:   5.33 cm/s LVOT diam:     2.30 cm   LV E/e' lateral: 21.8 LV SV:         66 LV SV Index:   33 LVOT Area:     4.15 cm                           3D Volume EF:                          3D EF:        25 %                          LV EDV:       241 ml                          LV ESV:       181 ml                          LV SV:        60 ml RIGHT VENTRICLE            IVC RV Basal diam:  3.10 cm    IVC diam: 1.90 cm RV S prime:     9.90 cm/s TAPSE (M-mode): 1.6 cm     PULMONARY VEINS                            Diastolic Velocity: 67.90 cm/s                            S/D Velocity:       0.80                             Systolic Velocity:  53.10 cm/s LEFT ATRIUM             Index        RIGHT ATRIUM           Index LA diam:        4.60 cm 2.33 cm/m   RA Area:     16.20 cm LA Vol (A2C):   66.1 ml 33.43 ml/m  RA Volume:   41.80 ml  21.14 ml/m LA Vol (A4C):   57.6 ml 29.13 ml/m LA Biplane Vol: 62.0 ml 31.36 ml/m  AORTIC VALVE LVOT Vmax:   82.70 cm/s LVOT Vmean:  56.200 cm/s LVOT VTI:    0.159 m  AORTA Ao Root diam: 3.60 cm MITRAL VALVE                TRICUSPID VALVE MV Area (PHT): 5.42 cm     TR Peak grad:   27.2 mmHg MV Decel Time: 140 msec     TR Vmax:        261.00 cm/s MR Peak grad: 118.8 mmHg MR Mean grad: 82.0 mmHg     SHUNTS MR Vmax:      545.00 cm/s   Systemic  VTI:  0.16 m MR Vmean:     434.0 cm/s    Systemic Diam: 2.30 cm MV E velocity: 116.00 cm/s MV A velocity: 91.90 cm/s MV E/A ratio:  1.26 Caron Poser Electronically signed by Caron Poser Signature Date/Time: 09/21/2024/12:15:34 PM    Final    DG Chest Port 1 View Result Date: 09/17/2024 CLINICAL DATA:  Sepsis, altered level of consciousness EXAM: PORTABLE CHEST 1 VIEW COMPARISON:  08/03/2022 FINDINGS: Single frontal view of the chest demonstrates stable enlargement of the cardiac silhouette. There is new opacification at the right lung base, consistent with right basilar pneumonia. No effusion or pneumothorax. No acute bony abnormalities. IMPRESSION: 1. Right basilar opacification consistent with pneumonia. Followup PA and lateral chest X-ray is recommended in 3-4 weeks following trial of antibiotic therapy to ensure resolution and exclude underlying malignancy. 2. Stable enlarged cardiac silhouette. Electronically Signed   By: Ozell Daring M.D.   On: 09/17/2024 16:32    Microbiology: Results for orders placed or performed during the hospital encounter of 09/17/24  Resp panel by RT-PCR (RSV, Flu A&B, Covid) Anterior Nasal Swab     Status: None   Collection Time: 09/17/24  3:17 PM   Specimen: Anterior Nasal Swab  Result Value  Ref Range Status   SARS Coronavirus 2 by RT PCR NEGATIVE NEGATIVE Final    Comment: (NOTE) SARS-CoV-2 target nucleic acids are NOT DETECTED.  The SARS-CoV-2 RNA is generally detectable in upper respiratory specimens during the acute phase of infection. The lowest concentration of SARS-CoV-2 viral copies this assay can detect is 138 copies/mL. A negative result does not preclude SARS-Cov-2 infection and should not be used as the sole basis for treatment or other patient management decisions. A negative result may occur with  improper specimen collection/handling, submission of specimen other than nasopharyngeal swab, presence of viral mutation(s) within the areas targeted by this assay, and inadequate number of viral copies(<138 copies/mL). A negative result must be combined with clinical observations, patient history, and epidemiological information. The expected result is Negative.  Fact Sheet for Patients:  bloggercourse.com  Fact Sheet for Healthcare Providers:  seriousbroker.it  This test is no t yet approved or cleared by the [place]  FDA and  has been authorized for detection and/or diagnosis of SARS-CoV-2 by FDA under an Emergency Use Authorization (EUA). This EUA will remain  in effect (meaning this test can be used) for the duration of the COVID-19 declaration under Section 564(b)(1) of the Act, 21 U.S.C.section 360bbb-3(b)(1), unless the authorization is terminated  or revoked sooner.       Influenza A by PCR NEGATIVE NEGATIVE Final   Influenza B by PCR NEGATIVE NEGATIVE Final    Comment: (NOTE) The Xpert Xpress SARS-CoV-2/FLU/RSV plus assay is intended as an aid in the diagnosis of influenza from Nasopharyngeal swab specimens and should not be used as a sole basis for treatment. Nasal washings and aspirates are unacceptable for Xpert Xpress SARS-CoV-2/FLU/RSV testing.  Fact Sheet for  Patients: bloggercourse.com  Fact Sheet for Healthcare Providers: seriousbroker.it  This test is not yet approved or cleared by the [place]  FDA and has been authorized for detection and/or diagnosis of SARS-CoV-2 by FDA under an Emergency Use Authorization (EUA). This EUA will remain in effect (meaning this test can be used) for the duration of the COVID-19 declaration under Section 564(b)(1) of the Act, 21 U.S.C. section 360bbb-3(b)(1), unless the authorization is terminated or revoked.     Resp Syncytial Virus by PCR NEGATIVE NEGATIVE Final  Comment: (NOTE) Fact Sheet for Patients: bloggercourse.com  Fact Sheet for Healthcare Providers: seriousbroker.it  This test is not yet approved or cleared by the [place]  FDA and has been authorized for detection and/or diagnosis of SARS-CoV-2 by FDA under an Emergency Use Authorization (EUA). This EUA will remain in effect (meaning this test can be used) for the duration of the COVID-19 declaration under Section 564(b)(1) of the Act, 21 U.S.C. section 360bbb-3(b)(1), unless the authorization is terminated or revoked.  Performed at [facility], [address] [place], [place] [address]   Blood Culture (routine x 2)     Status: None   Collection Time: 09/17/24  3:17 PM   Specimen: BLOOD LEFT FOREARM  Result Value Ref Range Status   Specimen Description BLOOD LEFT FOREARM  Final   Special Requests   Final    BOTTLES DRAWN AEROBIC AND ANAEROBIC Blood Culture adequate volume   Culture   Final    NO GROWTH 5 DAYS Performed at [facility], [address] [place], [place] [address]    Report Status 09/22/2024 FINAL  Final  Blood Culture (routine x 2)     Status: None   Collection Time: 09/17/24  3:21 PM   Specimen: Right Antecubital; Blood  Result Value Ref Range Status   Specimen Description RIGHT  ANTECUBITAL  Final   Special Requests   Final    BOTTLES DRAWN AEROBIC AND ANAEROBIC Blood Culture results may not be optimal due to an inadequate volume of blood received in culture bottles   Culture   Final    NO GROWTH 5 DAYS Performed at [facility], [address] [place], [place] [address]    Report Status 09/22/2024 FINAL  Final    Labs: CBC: Recent Labs  Lab 09/17/24 1517 09/19/24 0441 09/20/24 0517 09/21/24 0432 09/22/24 0437  WBC 15.0* 7.9 7.9 6.7 8.4  NEUTROABS 13.4*  --   --   --   --   HGB 14.1 12.7* 13.2 11.0* 12.0*  HCT 42.0 37.8* 39.4 33.0* 35.6*  MCV 90.7 91.1 91.2 91.9 90.6  PLT 169 149* 164 155 178   Basic Metabolic Panel: Recent Labs  Lab 09/17/24 1517 09/18/24 0417 09/19/24 0441 09/20/24 0517 09/20/24 1306 09/21/24 0432 09/21/24 1335 09/22/24 0437  NA 140  --  142 140  --  139  --  142  K 4.1  --  3.7 3.7  --  4.5  --  4.3  CL 101  --  107 106  --  106  --  105  CO2 29  --  24 21*  --  25  --  28  GLUCOSE 169*  --  111* 111*  --  138*  --  118*  BUN 13  --  11 9  --  11  --  13  CREATININE 1.35*  --  1.01 1.04  --  0.99  --  1.23  CALCIUM  9.2  --  9.1 9.4  --  8.7*  --  9.3  MG  --  1.0* 1.9  --  1.5*  --  1.6*  --   PHOS  --   --   --   --  2.3*  --   --   --    Liver Function Tests: Recent Labs  Lab 09/17/24 1517  AST 27  ALT 14  ALKPHOS 79  BILITOT 1.1  PROT 7.3  ALBUMIN  4.6   CBG: Recent Labs  Lab 09/21/24 1230  09/21/24 1720 09/21/24 2058 09/22/24 0828 09/22/24 1129  GLUCAP 148* 131* 143* 117* 111*    Discharge time spent: Time Coordinating Discharge: I spent a total of 35 minutes engaged in face-to-face discussion with the patient and/or caregivers regarding the patients care, assessment, plan, and discharge disposition. Over 50% of this time was dedicated to counseling the patient on the risks and benefits of treatment options and the discharge plan, as well as coordinating post-discharge  care.   Signed: Irem Stoneham Al-Sultani, MD [facility] 09/22/2024         "

## 2024-09-22 NOTE — TOC Transition Note (Signed)
 Transition of Care [facility]) - Discharge Note   Patient Details  Name: Clifford Martin MRN: [id number] Date of Birth: Jun 13, 1941  [facility] CM/SW Contact:  [name], RN Phone Number: 09/22/2024, 4:43 PM   Clinical Narrative:    RNCM spoke to MD and he informed me that the patient was being discharged home today with oxygen. Order sent to [employer] [facility] via email for oxygen to be delivered to bedside. Wife picking him up at the time of discharge. Pt has discharge orders, no further concerns. RNCM signing off.  Final next level of care: Home/Self Care Barriers to Discharge: Barriers Resolved   Patient Goals and CMS Choice            Discharge Placement                Patient to be transferred to facility by: Spouse Name of family member notified: [name] Patient and family notified of of transfer: 09/22/24  Discharge Plan and Services Additional resources added to the After Visit Summary for                  DME Arranged: Oxygen DME Agency: [facility] Date DME Agency Contacted: 09/22/24   Representative spoke with at DME Agency: Order set to [employer] via email            Social Drivers of Health (SDOH) Interventions SDOH Screenings   Food Insecurity: No Food Insecurity (09/19/2024)  Housing: Low Risk (09/19/2024)  Transportation Needs: No Transportation Needs (09/19/2024)  Utilities: Not At Risk (09/19/2024)  Financial Resource Strain: Low Risk (03/28/2024)   Received from [facility]  Social Connections: Moderately Isolated (09/19/2024)  Tobacco Use: Medium Risk (09/17/2024)  Health Literacy: Low Risk (03/28/2024)   Received from [facility]     Readmission Risk Interventions     No data to display
